# Patient Record
Sex: Male | Born: 1950 | Race: White | Hispanic: No | Marital: Married | Smoking: Current every day smoker
Health system: Southern US, Community
[De-identification: ages and names within clinical notes are randomized; demographics above are authoritative.]

## PROBLEM LIST (undated history)

## (undated) DIAGNOSIS — M1990 Unspecified osteoarthritis, unspecified site: Secondary | ICD-10-CM

## (undated) DIAGNOSIS — Z720 Tobacco use: Secondary | ICD-10-CM

## (undated) DIAGNOSIS — Z8679 Personal history of other diseases of the circulatory system: Secondary | ICD-10-CM

## (undated) DIAGNOSIS — I4819 Other persistent atrial fibrillation: Secondary | ICD-10-CM

## (undated) DIAGNOSIS — I351 Nonrheumatic aortic (valve) insufficiency: Secondary | ICD-10-CM

## (undated) DIAGNOSIS — I10 Essential (primary) hypertension: Secondary | ICD-10-CM

## (undated) DIAGNOSIS — Z9289 Personal history of other medical treatment: Secondary | ICD-10-CM

## (undated) DIAGNOSIS — B1920 Unspecified viral hepatitis C without hepatic coma: Secondary | ICD-10-CM

## (undated) DIAGNOSIS — F419 Anxiety disorder, unspecified: Secondary | ICD-10-CM

## (undated) DIAGNOSIS — I739 Peripheral vascular disease, unspecified: Secondary | ICD-10-CM

## (undated) DIAGNOSIS — A159 Respiratory tuberculosis unspecified: Secondary | ICD-10-CM

## (undated) DIAGNOSIS — M75101 Unspecified rotator cuff tear or rupture of right shoulder, not specified as traumatic: Secondary | ICD-10-CM

## (undated) HISTORY — DX: Personal history of other diseases of the circulatory system: Z86.79

## (undated) HISTORY — PX: OTHER SURGICAL HISTORY: SHX169

## (undated) HISTORY — DX: Nonrheumatic aortic (valve) insufficiency: I35.1

## (undated) HISTORY — DX: Peripheral vascular disease, unspecified: I73.9

## (undated) HISTORY — PX: TONSILLECTOMY: SHX5217

## (undated) HISTORY — DX: Essential (primary) hypertension: I10

## (undated) HISTORY — DX: Unspecified viral hepatitis C without hepatic coma: B19.20

## (undated) HISTORY — DX: Personal history of other medical treatment: Z92.89

## (undated) HISTORY — DX: Other persistent atrial fibrillation: I48.19

## (undated) HISTORY — DX: Tobacco use: Z72.0

---

## 1999-10-17 ENCOUNTER — Encounter (INDEPENDENT_AMBULATORY_CARE_PROVIDER_SITE_OTHER): Payer: Self-pay | Admitting: Specialist

## 1999-10-17 ENCOUNTER — Ambulatory Visit (HOSPITAL_COMMUNITY): Admission: RE | Admit: 1999-10-17 | Discharge: 1999-10-17 | Payer: Self-pay | Admitting: Gastroenterology

## 2004-03-16 ENCOUNTER — Ambulatory Visit (HOSPITAL_COMMUNITY): Admission: RE | Admit: 2004-03-16 | Discharge: 2004-03-16 | Payer: Self-pay | Admitting: Gastroenterology

## 2006-12-01 ENCOUNTER — Emergency Department (HOSPITAL_COMMUNITY): Admission: EM | Admit: 2006-12-01 | Discharge: 2006-12-01 | Payer: Self-pay | Admitting: Emergency Medicine

## 2007-07-27 ENCOUNTER — Emergency Department (HOSPITAL_COMMUNITY): Admission: EM | Admit: 2007-07-27 | Discharge: 2007-07-27 | Payer: Self-pay | Admitting: Internal Medicine

## 2009-02-19 ENCOUNTER — Emergency Department (HOSPITAL_COMMUNITY)
Admission: EM | Admit: 2009-02-19 | Discharge: 2009-02-19 | Payer: Self-pay | Source: Home / Self Care | Admitting: Emergency Medicine

## 2010-01-19 ENCOUNTER — Ambulatory Visit (HOSPITAL_COMMUNITY)
Admission: RE | Admit: 2010-01-19 | Discharge: 2010-01-19 | Payer: Self-pay | Source: Home / Self Care | Attending: Family Medicine | Admitting: Family Medicine

## 2010-03-01 ENCOUNTER — Ambulatory Visit (INDEPENDENT_AMBULATORY_CARE_PROVIDER_SITE_OTHER): Payer: BC Managed Care – PPO | Admitting: Cardiovascular Disease

## 2010-03-01 DIAGNOSIS — R5381 Other malaise: Secondary | ICD-10-CM

## 2010-03-01 DIAGNOSIS — R5383 Other fatigue: Secondary | ICD-10-CM

## 2010-03-01 DIAGNOSIS — I119 Hypertensive heart disease without heart failure: Secondary | ICD-10-CM

## 2010-04-06 LAB — DIFFERENTIAL
Basophils Absolute: 0 10*3/uL (ref 0.0–0.1)
Basophils Relative: 1 % (ref 0–1)
Eosinophils Relative: 3 % (ref 0–5)
Lymphocytes Relative: 31 % (ref 12–46)
Monocytes Absolute: 0.8 10*3/uL (ref 0.1–1.0)
Monocytes Relative: 13 % — ABNORMAL HIGH (ref 3–12)
Neutro Abs: 3.2 10*3/uL (ref 1.7–7.7)
Neutrophils Relative %: 52 % (ref 43–77)

## 2010-04-06 LAB — BASIC METABOLIC PANEL
CO2: 28 mEq/L (ref 19–32)
Chloride: 102 mEq/L (ref 96–112)
Creatinine, Ser: 0.97 mg/dL (ref 0.4–1.5)
GFR calc Af Amer: >60 mL/min (ref >60)
Glucose, Bld: 105 mg/dL — ABNORMAL HIGH (ref 70–99)
Potassium: 3.6 mEq/L (ref 3.5–5.1)

## 2010-04-06 LAB — CBC
HCT: 43.2 % (ref 39.0–52.0)
MCHC: 34.6 g/dL (ref 30.0–36.0)
WBC: 6.1 10*3/uL (ref 4.0–10.5)

## 2010-04-06 LAB — POCT CARDIAC MARKERS: Myoglobin, poc: 71.7 ng/mL (ref 12–200)

## 2010-06-02 NOTE — Procedures (Signed)
[facility]  Patient:    Kevin Leon, Kevin Leon                        MRN: [id number] Proc. Date: 10/17/99 Adm. Date:  [id number] Attending:  Deneen Harts CC:         Teena Irani. Arlyce Dice, M.D.   Procedure Report  PROCEDURE PERFORMED:  Percutaneous liver biopsy.  ENDOSCOPIST:  Griffith Citron, M.D.  INDICATIONS:  A 60 year old white male undergoing percutaneous liver biopsy to assess degree of hepatic inflammation and fibrosis due to chronic viral hepatitis C.  DESCRIPTION OF PROCEDURE:  After reviewing the nature of the procedure with the patient including the potential risks of hemorrhage and perforation, informed consent was signed.  The patient was examined and a site selected in the 9th intercostal space right mid axillary line with dullness to percussion and full exploration.  No bruit or rub heard on auscultation.  The patient was premedicated receiving Versed 3 mg and fentanyl 37.5 mcg IV. He was maintained on low flow oxygen and monitor throughout the course of the procedure.  The biopsy site was prepped with Betadine followed by local 1% Xylocaine infiltration.  Using Klatskin liver biopsy technique, a single pass was made after a trocar puncture.  This yielded two grossly normal appearing cores of hepatic tissue each approximately 2 cm in length.  These were submitted for routine A/E.  The wound was dressed with a pressure dressing and the patient rotated into a right lateral decubitus position.  He tolerated the procedure without immediate complication.  He returned to recovery in stable condition.  ASSESSMENT: 1. Percutaneous liver biopsy successfully completed without    immediate complication. 2. Chronic hepatitis C.  RECOMMENDATION: 1. Post procedure order is written. 2. Patient instructions reviewed. 3. Return office visit in two weeks to follow up pathology and    discuss roll of antiviral treatment. DD:  10/17/99 TD:   10/17/99 Job: [id number]

## 2010-06-02 NOTE — Op Note (Signed)
NAME:  Kevin Leon, Kevin Leon               ACCOUNT NO.:  [id number]   MEDICAL RECORD NO.:  [id number]          PATIENT TYPE:  AMB   LOCATION:  ENDO                         FACILITY:  [facility]   PHYSICIAN:  James L. Malon Kindle., M.D.DATE OF BIRTH:  1950-05-12   DATE OF PROCEDURE:  03/16/2004  DATE OF DISCHARGE:                                 OPERATIVE REPORT   PROCEDURE:  Colonoscopy.   MEDICATIONS:  Fentanyl 100 mcg, Versed 10 mg IV.   ENDOSCOPE:  Olympus pediatric adjustable colonoscope.   INDICATIONS:  Strong family history of colon cancer.   DESCRIPTION OF PROCEDURE:  The procedure explained to the patient and  consent obtained.  With the patient in the left lateral decubitus position,  the Olympus scope was inserted and advanced.  The prep was quite good.  By  using abdominal pressure and position changes, we were eventually able to  reach the cecum with the patient in the right lateral decubitus position.  The ileocecal valve and appendiceal orifice were seen.  The scope withdrawn  and the cecum, ascending colon, transverse colon, splenic flexure,  descending and sigmoid colon seen well.  No polyps seen, no diverticulosis.  The rectum was free of polyps.  The scope was withdrawn.  The patient  tolerated the procedure well.   ASSESSMENT:  Strong family history of colon cancer with negative colonoscopy  at this time, V16.0.   PLAN:  Will recommend yearly Hemoccults, repeat colonoscopy in five years.      JLE/MEDQ  D:  03/16/2004  T:  03/16/2004  Job:  [id number]   cc:   Teena Irani. Arlyce Dice, M.D.  [address]  [place]  [place] [address]  Fax: [phone]

## 2010-06-26 ENCOUNTER — Other Ambulatory Visit: Payer: Self-pay | Admitting: Cardiovascular Disease

## 2010-06-26 NOTE — Telephone Encounter (Signed)
Fax received from pharmacy. Refill completed. Jodette Semisi Biela RN  

## 2010-10-12 LAB — POCT CARDIAC MARKERS
CKMB, poc: 1.2
CKMB, poc: <1 — ABNORMAL LOW
Myoglobin, poc: 48.4
Myoglobin, poc: 61.1
Operator id: 294521
Troponin i, poc: <0.05

## 2010-10-12 LAB — POCT I-STAT, CHEM 8
Chloride: 104
Glucose, Bld: 107 — ABNORMAL HIGH
HCT: 47
Hemoglobin: 16
Potassium: 4.1
Sodium: 141
TCO2: 29

## 2010-10-12 LAB — CBC
HCT: 44
Hemoglobin: 15.1
RBC: 4.57
RDW: 12.9

## 2010-12-02 ENCOUNTER — Other Ambulatory Visit: Payer: Self-pay | Admitting: Cardiovascular Disease

## 2011-05-15 ENCOUNTER — Ambulatory Visit (INDEPENDENT_AMBULATORY_CARE_PROVIDER_SITE_OTHER): Payer: BC Managed Care – PPO | Admitting: Cardiovascular Disease

## 2011-05-15 ENCOUNTER — Encounter: Payer: Self-pay | Admitting: Cardiovascular Disease

## 2011-05-15 VITALS — BP 160/80 | HR 56 | Ht 72.0 in | Wt 177.8 lb

## 2011-05-15 DIAGNOSIS — I10 Essential (primary) hypertension: Secondary | ICD-10-CM

## 2011-05-15 NOTE — Assessment & Plan Note (Signed)
Kevin Leon continues to have an elevated blood pressure. He's now drinking between 6 and 8 beers at night. I think this is a large part of his hypertension.  I've encouraged him to cut back significantly on his alcohol intake. We have counseled him regarding a low-salt diet. I've asked him to exercise on a regular basis.  He'll continue to check his blood pressure and I'll see him again in several months for an office visit. He'll write down his blood pressure readings and will bring them with him at his next visit.

## 2011-05-15 NOTE — Patient Instructions (Signed)
Your physician recommends that you schedule a follow-up appointment in: 3 months   Watch bp readings, keep a log to bring to your next appointment

## 2011-05-15 NOTE — Progress Notes (Signed)
    [name] Date of Birth  1950-07-24 [facility]     [place] Office  [address]    [address]   [address] [place], [place]  [address]    [place], [place]  [address] [phone]  Fax  [phone]  [phone]  Fax [phone]  Problem list: 1. Hypertension 2. Hepatitis C 3. Chest pain  History of Present Illness:  Brett Canales is a 61 y.o. gentleman with the above noted history.  He has done well since I last saw him a year ago.  He stopped smoking 2 months ago.  It has been the hardest thing he has ever done.  He is breathing much better.  His morning cough resolved after several days.  He has occasional episodes of sharp cp.    We performed a stress echocardiogram in 2010. He walked for 5 minutes and 14 seconds of an advanced Bruce protocol. He achieved a peak heart rate 151. He had no ST or T wave adenopathies. The echo images were normal. He had no evidence of ischemia.  He has had a CT of his chest and his lungs were found to be normal.  Current Outpatient Prescriptions on File Prior to Visit  Medication Sig Dispense Refill  . carvedilol (COREG) 6.25 MG tablet TAKE 1 TABLET BY MOUTH TWICE A DAY  180 tablet  2    No Known Allergies  Past Medical History  Diagnosis Date  . HTN (hypertension)   . Hepatitis C   . Chest pain     Past Surgical History  Procedure Date  . Tonsillectomy     History  Smoking status  . Former Smoker  . Quit date: 03/11/2011  Smokeless tobacco  . Not on file    History  Alcohol Use  . Yes    Daily    Family History  Problem Relation Age of Onset  . Hypertension Mother     Reviw of Systems:  Reviewed in the HPI.  All other systems are negative.  Physical Exam: Blood pressure 164/84, pulse 56, height 6' (1.829 m), weight 177 lb 12.8 oz (80.65 kg). General: Well developed, well nourished, in no acute distress.  Head: Normocephalic, atraumatic, sclera non-icteric, mucus membranes are moist,   Neck:  Supple. Carotids are 2 + without bruits. No JVD  Lungs: Clear bilaterally to auscultation.  Heart: regular rate.  normal  S1 S2. No murmurs, gallops or rubs.  Abdomen: Soft, non-tender, non-distended with normal bowel sounds. No hepatomegaly. No rebound/guarding. No masses.  Msk:  Strength and tone are normal  Extremities: No clubbing or cyanosis. No edema.  Distal pedal pulses are 2+ and equal bilaterally.  Neuro: Alert and oriented X 3. Moves all extremities spontaneously.  Psych:  Responds to questions appropriately with a normal affect.  ECG: 05/15/2011- sinus bradycardia at 56 beats a minute. Otherwise the EKG is normal.  Assessment / Plan:

## 2011-08-16 ENCOUNTER — Ambulatory Visit (INDEPENDENT_AMBULATORY_CARE_PROVIDER_SITE_OTHER): Payer: BC Managed Care – PPO | Admitting: Cardiovascular Disease

## 2011-08-16 ENCOUNTER — Encounter: Payer: Self-pay | Admitting: Cardiovascular Disease

## 2011-08-16 VITALS — BP 142/68 | HR 71 | Ht 72.0 in | Wt 176.8 lb

## 2011-08-16 DIAGNOSIS — I10 Essential (primary) hypertension: Secondary | ICD-10-CM

## 2011-08-16 NOTE — Assessment & Plan Note (Signed)
Brett Canales is doing well.  I agree with smoking cessation and greatly limiting his beer drinking.  I'll see him in 6 months for OV and fasting labs.

## 2011-08-16 NOTE — Progress Notes (Signed)
    Kevin Leon Date of Birth  04/20/1950 [facility]     [place] Office  [address]    [address]   [address] [place], [place]  [address]    [place], [place]  [address] [phone]  Fax  [phone]  [phone]  Fax [phone]  Problem list: 1. Hypertension 2. Hepatitis C 3. Chest pain  History of Present Illness:  Kevin Leon is a 61 y.o. gentleman with the above noted history.  He has done well since I last saw him a year ago.  He stopped smoking for 3 months but then restarted again after 3 months.   We performed a stress echocardiogram in 2010. He walked for 5 minutes and 14 seconds of an advanced Bruce protocol. He achieved a peak heart rate 151. He had no ST or T wave adenopathies. The echo images were normal. He had no evidence of ischemia.  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  He still drinks lots of beer.  This may account for his BP variability.   Current Outpatient Prescriptions on File Prior to Visit  Medication Sig Dispense Refill  . carvedilol (COREG) 6.25 MG tablet TAKE 1 TABLET BY MOUTH TWICE A DAY  180 tablet  2  . LORazepam (ATIVAN) 0.5 MG tablet Take 0.5 mg by mouth every 6 (six) hours as needed.         No Known Allergies  Past Medical History  Diagnosis Date  . HTN (hypertension)   . Hepatitis C   . Chest pain     Past Surgical History  Procedure Date  . Tonsillectomy     History  Smoking status  . Former Smoker  . Quit date: 03/11/2011  Smokeless tobacco  . Not on file    History  Alcohol Use  . Yes    Daily    Family History  Problem Relation Age of Onset  . Hypertension Mother     Reviw of Systems:  Reviewed in the HPI.  All other systems are negative.  Physical Exam: Blood pressure 142/68, pulse 71, height 6' (1.829 m), weight 176 lb 12.8 oz (80.196 kg). General: Well developed, well nourished, in no acute distress.  Head: Normocephalic, atraumatic, sclera  non-icteric, mucus membranes are moist,   Neck: Supple. Carotids are 2 + without bruits. No JVD  Lungs: Clear bilaterally to auscultation.  Heart: regular rate.  normal  S1 S2. No murmurs, gallops or rubs.  Abdomen: Soft, non-tender, non-distended with normal bowel sounds. No hepatomegaly. No rebound/guarding. No masses.  Msk:  Strength and tone are normal  Extremities: No clubbing or cyanosis. No edema.  Distal pedal pulses are 2+ and equal bilaterally.  Neuro: Alert and oriented X 3. Moves all extremities spontaneously.  Psych:  Responds to questions appropriately with a normal affect.  ECG:  Assessment / Plan:

## 2011-08-16 NOTE — Patient Instructions (Addendum)
Your physician wants you to follow-up in: 6 months  You will receive a reminder letter in the mail two months in advance. If you don't receive a letter, please call our office to schedule the follow-up appointment.  Your physician recommends that you return for a FASTING lipid profile: 6 months   

## 2011-12-18 ENCOUNTER — Telehealth: Payer: Self-pay | Admitting: Cardiovascular Disease

## 2011-12-18 MED ORDER — CARVEDILOL 6.25 MG PO TABS: 6.2500 mg | ORAL_TABLET | Freq: Two times a day (BID) | ORAL | Status: DC

## 2011-12-18 NOTE — Telephone Encounter (Signed)
Fax Received. Refill Completed. Tristian Sickinger Chowoe (R.M.A)   

## 2011-12-18 NOTE — Telephone Encounter (Signed)
New Problem:    Patient called in needing a refill of his carvedilol (COREG) 6.25 MG tablet.  Patient is out of his medication.

## 2011-12-19 ENCOUNTER — Other Ambulatory Visit: Payer: Self-pay | Admitting: *Deleted

## 2011-12-19 NOTE — Telephone Encounter (Signed)
Opened in Error.

## 2012-05-23 ENCOUNTER — Ambulatory Visit: Payer: Self-pay | Admitting: Physician Assistant

## 2012-08-01 ENCOUNTER — Ambulatory Visit: Payer: BC Managed Care – PPO | Admitting: Family Medicine

## 2012-09-23 ENCOUNTER — Other Ambulatory Visit: Payer: Self-pay | Admitting: *Deleted

## 2012-09-23 MED ORDER — CARVEDILOL 6.25 MG PO TABS: 6.2500 mg | ORAL_TABLET | Freq: Two times a day (BID) | ORAL | Status: DC

## 2013-02-26 ENCOUNTER — Telehealth: Payer: Self-pay | Admitting: Cardiovascular Disease

## 2013-02-26 NOTE — Telephone Encounter (Deleted)
New message ° °Kevin Leon with Dr. Pottors office called states the pt has Tremors. Is currently taking Propranolol 80 mg-- 2 times a day. The pt is currently on Metoprolol. Please call back to discuss changing the medication ° °[phone] °Kevin Leon with Dr. Pottors  ° °

## 2013-03-02 NOTE — Telephone Encounter (Signed)
Spoke with sender/ this was sent in error.

## 2013-03-13 ENCOUNTER — Encounter: Payer: Self-pay | Admitting: Cardiovascular Disease

## 2013-03-13 ENCOUNTER — Ambulatory Visit (INDEPENDENT_AMBULATORY_CARE_PROVIDER_SITE_OTHER): Payer: BC Managed Care – PPO | Admitting: Cardiovascular Disease

## 2013-03-13 ENCOUNTER — Encounter (INDEPENDENT_AMBULATORY_CARE_PROVIDER_SITE_OTHER): Payer: Self-pay

## 2013-03-13 VITALS — BP 150/72 | HR 65 | Ht 72.0 in | Wt 167.0 lb

## 2013-03-13 DIAGNOSIS — Z09 Encounter for follow-up examination after completed treatment for conditions other than malignant neoplasm: Secondary | ICD-10-CM

## 2013-03-13 DIAGNOSIS — I10 Essential (primary) hypertension: Secondary | ICD-10-CM

## 2013-03-13 MED ORDER — CARVEDILOL 6.25 MG PO TABS: 6.2500 mg | ORAL_TABLET | Freq: Two times a day (BID) | ORAL | Status: DC

## 2013-03-13 MED ORDER — LISINOPRIL 10 MG PO TABS: 10.0000 mg | ORAL_TABLET | Freq: Every day | ORAL | Status: DC

## 2013-03-13 NOTE — Patient Instructions (Signed)
Your physician has recommended you make the following change in your medication:  START LISINOPRIL 10 MG DAILY  Your physician recommends that you return for a FASTING lipid profile: 3 WEEKS   Your physician recommends that you schedule a follow-up appointment in: 3 MONTHS

## 2013-03-13 NOTE — Assessment & Plan Note (Signed)
His BP is mildly elevated.  Will start him on Lisinopril 10 mg a day.  Will have him check BMP in 3 weeks.  He is also overdue for his lipids. Lipid profile and liver enzymes at that same time.  I will see him in 3 months.

## 2013-03-13 NOTE — Progress Notes (Signed)
    Belva Bertin Date of Birth  06/21/1950 [place] [address]    [place]   [place], [place]  [address]    [place], [place]  [address] [phone]  Fax  [phone]  [phone]  Fax [phone]  Problem list: 1. Hypertension 2. Hepatitis C 3. Chest pain  History of Present Illness:  Kevin Leon is a 63 y.o. gentleman with the above noted history.  He has done well since I last saw him a year ago.  He stopped smoking for 3 months but then restarted again after 3 months.   We performed a stress echocardiogram in 2010. He walked for 5 minutes and 14 seconds of an advanced Bruce protocol. He achieved a peak heart rate 151. He had no ST or T wave abnormalities The echo images were normal. He had no evidence of ischemia.  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  He still drinks lots of beer.  This may account for his BP variability.  Mar 13, 2013:  He has done well.  Slightly anxious about his BP today. He has continued to smoke.   He stopped for 3 months but restarted back after his mother died.  He has cut back on her beer intake.      Current Outpatient Prescriptions on File Prior to Visit  Medication Sig Dispense Refill  . carvedilol (COREG) 6.25 MG tablet Take 1 tablet (6.25 mg total) by mouth 2 (two) times daily with a meal.  180 tablet  1  . LORazepam (ATIVAN) 0.5 MG tablet Take 0.5 mg by mouth every 6 (six) hours as needed.        No current facility-administered medications on file prior to visit.    No Known Allergies  Past Medical History  Diagnosis Date  . HTN (hypertension)   . Hepatitis C   . Chest pain     Past Surgical History  Procedure Laterality Date  . Tonsillectomy      History  Smoking status  . Former Smoker  . Quit date: 03/11/2011  Smokeless tobacco  . Not on file    History  Alcohol Use  . Yes    Comment: Daily    Family History  Problem  Relation Age of Onset  . Hypertension Mother     Reviw of Systems:  Reviewed in the HPI.  All other systems are negative.  Physical Exam: Blood pressure 170/70, pulse 65, height 6' (1.829 m), weight 167 lb (75.751 kg). General: Well developed, well nourished, in no acute distress.  Head: Normocephalic, atraumatic, sclera non-icteric, mucus membranes are moist,  Neck: Supple. Carotids are 2 + without bruits. No JVD Lungs: Clear bilaterally to auscultation. Heart: regular rate.  normal  S1 S2. No murmurs, gallops or rubs. Abdomen: Soft, non-tender, non-distended with normal bowel sounds. No hepatomegaly. No rebound/guarding. No masses. Msk:  Strength and tone are normal Extremities: No clubbing or cyanosis. No edema.  Distal pedal pulses are 2+ and equal bilaterally. Neuro: Alert and oriented X 3. Moves all extremities spontaneously. Psych:  Responds to questions appropriately with a normal affect.  ECG: 03/13/2013: Normal sinus rhythm at 64. He has no ST or T wave changes. Assessment / Plan:

## 2013-03-31 DIAGNOSIS — B356 Tinea cruris: Secondary | ICD-10-CM | POA: Insufficient documentation

## 2013-03-31 DIAGNOSIS — F411 Generalized anxiety disorder: Secondary | ICD-10-CM | POA: Insufficient documentation

## 2013-03-31 DIAGNOSIS — B1920 Unspecified viral hepatitis C without hepatic coma: Secondary | ICD-10-CM | POA: Insufficient documentation

## 2013-04-03 ENCOUNTER — Other Ambulatory Visit (INDEPENDENT_AMBULATORY_CARE_PROVIDER_SITE_OTHER): Payer: BC Managed Care – PPO

## 2013-04-03 DIAGNOSIS — I10 Essential (primary) hypertension: Secondary | ICD-10-CM

## 2013-04-03 DIAGNOSIS — Z09 Encounter for follow-up examination after completed treatment for conditions other than malignant neoplasm: Secondary | ICD-10-CM

## 2013-04-03 LAB — HEPATIC FUNCTION PANEL
ALT: 21 U/L (ref 0–53)
AST: 23 U/L (ref 0–37)
Albumin: 4.3 g/dL (ref 3.5–5.2)
Alkaline Phosphatase: 61 U/L (ref 39–117)
Bilirubin, Direct: 0.1 mg/dL (ref 0.0–0.3)
Total Bilirubin: 0.9 mg/dL (ref 0.3–1.2)
Total Protein: 7 g/dL (ref 6.0–8.3)

## 2013-04-03 LAB — BASIC METABOLIC PANEL
BUN: 8 mg/dL (ref 6–23)
CALCIUM: 9.2 mg/dL (ref 8.4–10.5)
CHLORIDE: 97 meq/L (ref 96–112)
CO2: 31 meq/L (ref 19–32)
CREATININE: 0.8 mg/dL (ref 0.4–1.5)
GFR: 100.74 mL/min (ref >60.00)
GLUCOSE: 96 mg/dL (ref 70–99)
Potassium: 4.5 mEq/L (ref 3.5–5.1)
SODIUM: 133 meq/L — AB (ref 135–145)

## 2013-04-03 LAB — LIPID PANEL
CHOL/HDL RATIO: 2
CHOLESTEROL: 151 mg/dL (ref 0–200)
HDL: 84.5 mg/dL (ref >39.00)
LDL Cholesterol: 63 mg/dL (ref 0–99)
TRIGLYCERIDES: 19 mg/dL (ref 0.0–149.0)
VLDL: 3.8 mg/dL (ref 0.0–40.0)

## 2013-06-11 ENCOUNTER — Ambulatory Visit (INDEPENDENT_AMBULATORY_CARE_PROVIDER_SITE_OTHER): Payer: BC Managed Care – PPO | Admitting: Cardiovascular Disease

## 2013-06-11 ENCOUNTER — Encounter: Payer: Self-pay | Admitting: Cardiovascular Disease

## 2013-06-11 VITALS — BP 133/69 | HR 72 | Ht 72.0 in | Wt 164.0 lb

## 2013-06-11 DIAGNOSIS — I10 Essential (primary) hypertension: Secondary | ICD-10-CM

## 2013-06-11 NOTE — Assessment & Plan Note (Signed)
Kevin Leon is doing well.    BP is well controlled.   He has a slight cough but it's non consistent enough to be an Ace inhibitor cough.    Overall he is doing very well.  I will see him in 1 year.

## 2013-06-11 NOTE — Progress Notes (Signed)
Kevin Leon Date of Birth  1950-11-24 [place] [address]    [place]   [place], [place]  [address]    [place], [place]  [address] [phone]  Fax  [phone]  [phone]  Fax [phone]  Problem list: 1. Hypertension 2. Hepatitis C 3. Chest pain  History of Present Illness:  Kevin Leon is a 63 y.o. gentleman with the above noted history.  He has done well since I last saw him a year ago.  He stopped smoking for 3 months but then restarted again after 3 months.   We performed a stress echocardiogram in 2010. He walked for 5 minutes and 14 seconds of an advanced Bruce protocol. He achieved a peak heart rate 151. He had no ST or T wave abnormalities The echo images were normal. He had no evidence of ischemia.  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  He still drinks lots of beer.  This may account for his BP variability.  Mar 13, 2013:  He has done well.  Slightly anxious about his BP today. He has continued to smoke.   He stopped for 3 months but restarted back after his mother died.  He has cut back on her beer intake.    Jun 11, 2013:  Kevin Leon is doing well.  BP has been much better.  Lisinopril has been well tolerated.   He has had a slight cough but it's not continuous.  Doubt its due to ACE-I.    Current Outpatient Prescriptions on File Prior to Visit  Medication Sig Dispense Refill  . carvedilol (COREG) 6.25 MG tablet Take 1 tablet (6.25 mg total) by mouth 2 (two) times daily with a meal.  180 tablet  1  . lisinopril (PRINIVIL,ZESTRIL) 10 MG tablet Take 1 tablet (10 mg total) by mouth daily.  90 tablet  1  . LORazepam (ATIVAN) 0.5 MG tablet Take 0.5 mg by mouth every 6 (six) hours as needed.        No current facility-administered medications on file prior to visit.    No Known Allergies  Past Medical History  Diagnosis Date  . HTN (hypertension)   . Hepatitis C    . Chest pain     Past Surgical History  Procedure Laterality Date  . Tonsillectomy      History  Smoking status  . Former Smoker  . Quit date: 03/11/2011  Smokeless tobacco  . Not on file    History  Alcohol Use  . Yes    Comment: Daily    Family History  Problem Relation Age of Onset  . Hypertension Mother     Reviw of Systems:  Reviewed in the HPI.  All other systems are negative.  Physical Exam: Blood pressure 133/69, pulse 72, height 6' (1.829 m), weight 164 lb (74.39 kg). General: Well developed, well nourished, in no acute distress.  Head: Normocephalic, atraumatic, sclera non-icteric, mucus membranes are moist,  Neck: Supple. Carotids are 2 + without bruits. No JVD Lungs: Clear bilaterally to auscultation. Heart: regular rate.  normal  S1 S2. No murmurs, gallops or rubs. Abdomen: Soft, non-tender, non-distended with normal bowel sounds. No hepatomegaly. No rebound/guarding. No masses. Msk:  Strength and tone are normal Extremities: No clubbing or cyanosis. No edema.  Distal pedal pulses are 2+ and equal bilaterally. Neuro: Alert and oriented X 3. Moves all extremities  spontaneously. Psych:  Responds to questions appropriately with a normal affect.  ECG: 03/13/2013: Normal sinus rhythm at 64. He has no ST or T wave changes. Assessment / Plan:

## 2013-06-11 NOTE — Patient Instructions (Signed)
Your physician recommends that you continue on your current medications as directed. Please refer to the Current Medication list given to you today.  Your physician wants you to follow-up in: 1 year with Dr. Nahser.  You will receive a reminder letter in the mail two months in advance. If you don't receive a letter, please call our office to schedule the follow-up appointment.  

## 2013-07-10 ENCOUNTER — Ambulatory Visit (INDEPENDENT_AMBULATORY_CARE_PROVIDER_SITE_OTHER): Payer: BC Managed Care – PPO | Admitting: Family Medicine

## 2013-07-10 ENCOUNTER — Encounter: Payer: Self-pay | Admitting: Family Medicine

## 2013-07-10 VITALS — BP 108/64 | HR 62 | Temp 97.4°F | Resp 16 | Ht 72.0 in | Wt 165.0 lb

## 2013-07-10 DIAGNOSIS — H6120 Impacted cerumen, unspecified ear: Secondary | ICD-10-CM

## 2013-07-10 DIAGNOSIS — H6122 Impacted cerumen, left ear: Secondary | ICD-10-CM

## 2013-07-10 NOTE — Assessment & Plan Note (Signed)
Irrigated at bedside, return prn

## 2013-07-10 NOTE — Patient Instructions (Signed)
FOllow up as needed

## 2013-07-10 NOTE — Progress Notes (Signed)
Patient ID: Kevin Leon, male   DOB: 1950-07-12, 63 y.o.   MRN: [id number]   Subjective:    Patient ID: Kevin Leon, male    DOB: [birth year], 63 y.o.   MRN: [id number]  Patient presents for Increased ear wax  patient here with wax in his ears. He states that he gets this about once a year. He typically follows with some her full family practice but does come to [facility] as needed for acute visits. He has no other concerns today. He's noticed his hearing is decreased on the left side mostly which he thinks is mostly impacted. He's not had any cough sinus drainage no other concerns. His medications were reviewed     Review Of Systems:  GEN- denies fatigue, fever, weight loss,weakness, recent illness HEENT- denies eye drainage, change in vision, nasal discharge, CVS- denies chest pain, palpitations RESP- denies SOB, cough, wheeze Neuro- denies headache, dizziness, syncope, seizure activity       Objective:    BP 108/64  Pulse 62  Temp(Src) 97.4 F (36.3 C) (Oral)  Resp 16  Ht 6' (1.829 m)  Wt 165 lb (74.844 kg)  BMI 22.37 kg/m2 GEN- NAD, alert and oriented x3 HEENT- , MMM, oropharynx clear. Left TM canal impacted soft brown cerumen, right Canal mild narrowing, TM clear no wax impaction, no erythema, hearing grossly in tact Neck- Supple, no thyromegaly        Assessment & Plan:      Problem List Items Addressed This Visit   Cerumen impaction - Primary     Irrigated at bedside, return prn       Note: This dictation was prepared with Dragon dictation along with smaller Company secretary. Any transcriptional errors that result from this process are unintentional.

## 2013-08-24 ENCOUNTER — Telehealth: Payer: Self-pay | Admitting: Family Medicine

## 2013-08-24 NOTE — Telephone Encounter (Signed)
[id number]  PT is needing to know who did his colonoscopy

## 2013-08-24 NOTE — Telephone Encounter (Signed)
Look up in paper chart

## 2013-08-25 NOTE — Telephone Encounter (Signed)
Looked in pt paper chart in reference to whom done his colonoscopy and it was Dr. Laurence Spates at [facility], called pt and informed him who done last one.

## 2013-09-03 ENCOUNTER — Encounter: Payer: Self-pay | Admitting: Physician Assistant

## 2013-09-03 ENCOUNTER — Ambulatory Visit (INDEPENDENT_AMBULATORY_CARE_PROVIDER_SITE_OTHER): Payer: BC Managed Care – PPO | Admitting: Physician Assistant

## 2013-09-03 VITALS — BP 132/68 | HR 78 | Temp 97.7°F | Resp 14 | Ht 72.0 in | Wt 162.0 lb

## 2013-09-03 DIAGNOSIS — H00019 Hordeolum externum unspecified eye, unspecified eyelid: Secondary | ICD-10-CM

## 2013-09-03 DIAGNOSIS — H00016 Hordeolum externum left eye, unspecified eyelid: Secondary | ICD-10-CM

## 2013-09-03 MED ORDER — SULFACETAMIDE SODIUM 10 % OP SOLN: 1.0000 [drp] | OPHTHALMIC | Status: DC

## 2013-09-03 NOTE — Progress Notes (Signed)
    Patient ID: Kevin Leon MRN: [id number], DOB: Sep 18, 1950, 63 y.o. Date of Encounter: 09/03/2013, 11:18 AM    Chief Complaint:  Chief Complaint  Patient presents with  . Eye Infection    x3 days- L eye lower lid swollen and redness nted to sclera     HPI: 63 y.o. year old white male says that Dr. Deatra Ina was his doctor in the past and he's also friends with him.  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 Says that he's had this problem in the past and Dr. Deatra Ina gave him some antibiotic drops that cleared it right up. For the past few days has noticed this little bump at the edge of his left lower eyelid.  No other complaints today.     Home Meds:   Outpatient Prescriptions Prior to Visit  Medication Sig Dispense Refill  . carvedilol (COREG) 6.25 MG tablet Take 1 tablet (6.25 mg total) by mouth 2 (two) times daily with a meal.  180 tablet  1  . lisinopril (PRINIVIL,ZESTRIL) 10 MG tablet Take 1 tablet (10 mg total) by mouth daily.  90 tablet  1  . LORazepam (ATIVAN) 0.5 MG tablet Take 0.5 mg by mouth every 6 (six) hours as needed.        No facility-administered medications prior to visit.    Allergies: No Known Allergies    Review of Systems: See HPI for pertinent ROS. All other ROS negative.    Physical Exam: Blood pressure 132/68, pulse 78, temperature 97.7 F (36.5 C), temperature source Oral, resp. rate 14, height 6' (1.829 m), weight 162 lb (73.483 kg)., Body mass index is 21.97 kg/(m^2). General:  WNWD WM. Appears in no acute distress. HEENT: Left Eye: Lower Eyelid--lateral aspect--tiny papule, slightly pink.  No other papule, no other area of erythema.  Right Eye-Normal.  Neck: Supple. No thyromegaly. No lymphadenopathy. Lungs: Clear bilaterally to auscultation without wheezes, rales, or rhonchi. Breathing is unlabored. Heart: Regular rhythm. No  murmurs, rubs, or gallops. Msk:  Strength and tone normal for age. Extremities/Skin: Warm and dry.  Neuro: Alert and oriented X 3. Moves all extremities spontaneously. Gait is normal. CNII-XII grossly in tact. Psych:  Responds to questions appropriately with a normal affect.     ASSESSMENT AND PLAN:  63 y.o. year old male with  1. Hordeolum of left eye Recommended that every 2 hours he problem warm compress and white with cottonball with Johnson's and Johnson's baby shampoo on it. Then apply Bleph 10. Repeat this every 2 hours while awake. Continue this for one week. Followup if site worsens at all or does not resolve back to normal in one week. - sulfacetamide (BLEPH-10) 10 % ophthalmic solution; Place 1 drop into the left eye every 2 (two) hours while awake.  Dispense: 15 mL; Refill: 0   Signed, [address] [place], [place], [facility] 09/03/2013 11:18 AM

## 2013-09-09 ENCOUNTER — Other Ambulatory Visit: Payer: Self-pay | Admitting: Cardiovascular Disease

## 2013-09-22 ENCOUNTER — Ambulatory Visit (INDEPENDENT_AMBULATORY_CARE_PROVIDER_SITE_OTHER): Payer: BC Managed Care – PPO | Admitting: Family Medicine

## 2013-09-22 ENCOUNTER — Encounter: Payer: Self-pay | Admitting: Family Medicine

## 2013-09-22 VITALS — BP 132/68 | HR 64 | Temp 97.9°F | Resp 14 | Ht 72.0 in | Wt 162.0 lb

## 2013-09-22 DIAGNOSIS — H0019 Chalazion unspecified eye, unspecified eyelid: Secondary | ICD-10-CM

## 2013-09-22 DIAGNOSIS — H0015 Chalazion left lower eyelid: Secondary | ICD-10-CM

## 2013-09-22 NOTE — Addendum Note (Signed)
Addended by: Vic Blackbird F on: 09/22/2013 12:04 PM   Modules accepted: Level of Service

## 2013-09-22 NOTE — Patient Instructions (Signed)
Dr. Donato Heinz  3:30pm TODAY

## 2013-09-22 NOTE — Progress Notes (Signed)
Patient ID: Kevin Leon, male   DOB: 05-27-1950, 63 y.o.   MRN: [id number]   Subjective:    Patient ID: Kevin Leon, male    DOB: Dec 24, 1950, 63 y.o.   MRN: [id number]  Patient presents for Stye to L lower eye lid  patient here with persistent cystitis left lower eye. He's had this for approximately 3 weeks now. He was seen on August 20 at that time given Bleph-10 which he states he had something similar in the past and this cleared up. He has a mild tenderness on the lower lid no change in his vision he has not had any drainage from the lesion. She's not using the warm compresses    Review Of Systems:  GEN- denies fatigue, fever, weight loss,weakness, recent illness HEENT- denies eye drainage, change in vision, nasal discharge, Neuro- denies headache, dizziness, syncope, seizure activity       Objective:    BP 132/68  Pulse 64  Temp(Src) 97.9 F (36.6 C) (Oral)  Resp 14  Ht 6' (1.829 m)  Wt 162 lb (73.483 kg)  BMI 21.97 kg/m2 GEN- NAD, alert and oriented x3 HEENT- PERRL, EOMI, non injected sclera, mild injected left lower lid, hard chalazion of left lower lid, TTP, no drainage noted        Assessment & Plan:      Problem List Items Addressed This Visit   None    Visit Diagnoses   Chalazion of left lower eyelid    -  Primary    Referred to opthalmoloty this pm to have curretage done, d/c antibiotic drops       Note: This dictation was prepared with Dragon dictation along with smaller phrase technology. Any transcriptional errors that result from this process are unintentional.

## 2013-12-15 ENCOUNTER — Ambulatory Visit (INDEPENDENT_AMBULATORY_CARE_PROVIDER_SITE_OTHER): Payer: BC Managed Care – PPO | Admitting: Podiatry

## 2013-12-15 ENCOUNTER — Ambulatory Visit (INDEPENDENT_AMBULATORY_CARE_PROVIDER_SITE_OTHER): Payer: BC Managed Care – PPO

## 2013-12-15 ENCOUNTER — Encounter: Payer: Self-pay | Admitting: Podiatry

## 2013-12-15 VITALS — BP 144/69 | HR 66 | Resp 16

## 2013-12-15 DIAGNOSIS — M216X9 Other acquired deformities of unspecified foot: Secondary | ICD-10-CM

## 2013-12-15 DIAGNOSIS — Q667 Congenital pes cavus: Secondary | ICD-10-CM

## 2013-12-15 DIAGNOSIS — M79671 Pain in right foot: Secondary | ICD-10-CM

## 2013-12-15 DIAGNOSIS — L84 Corns and callosities: Secondary | ICD-10-CM

## 2013-12-15 DIAGNOSIS — M779 Enthesopathy, unspecified: Secondary | ICD-10-CM

## 2013-12-15 MED ORDER — TRIAMCINOLONE ACETONIDE 10 MG/ML IJ SUSP
10.0000 mg | Freq: Once | INTRAMUSCULAR | Status: AC
  Administered 2013-12-15: 10 mg

## 2013-12-15 NOTE — Progress Notes (Signed)
   Subjective:    Patient ID: Kevin Leon, male    DOB: 01/15/1951, 63 y.o.   MRN: [id number]  HPI Comments: "I have a bump on the bottom"  Patient c/o tender callused area plantar forefoot right for few months. Thought he had a splinter in his foot. The area is dark. She went to PCP and she said that its a callus from the bone underneath. Goes barefoot a lot. He's trying to wear shoes more to cushion the area. Wanted it checked. Still tender.     Review of Systems  All other systems reviewed and are negative.      Objective:   Physical Exam        Assessment & Plan:

## 2013-12-15 NOTE — Progress Notes (Signed)
Subjective:     Patient ID: Kevin Leon, male   DOB: 10-May-1950, 63 y.o.   MRN: [id number]  HPI patient states I developed a lot of pain in the bottom of my right foot over the last couple months. Patient states that it feels like it's on the bone and there is been fluid buildup and it's been inflamed and making walking difficult   Review of Systems  All other systems reviewed and are negative.      Objective:   Physical Exam  Constitutional: He is oriented to person, place, and time.  Cardiovascular: Intact distal pulses.   Musculoskeletal: Normal range of motion.  Neurological: He is oriented to person, place, and time.  Skin: Skin is warm.  Nursing note and vitals reviewed.  neurovascular status intact with muscle strength adequate and range of motion of the subtalar and midtarsal joint within normal limits. Patient is noted to have inflammation around the third metatarsal head right with fluid buildup and is noted to have a corn callus formation that's very painful when pressed. Digits are well-perfused and the patient is well oriented 3     Assessment:     Inflammatory callus sub-third metatarsal with inflammatory capsulitis noted third metatarsal phalangeal joint right    Plan:     H&P and x-rays reviewed. Did a careful injection of the area 3 mg Kenalog 5 mg Xylocaine to reduce the inflammatory plantar capsulitis and then did deep debridement of lesion with no iatrogenic bleeding noted

## 2014-03-01 ENCOUNTER — Encounter: Payer: Self-pay | Admitting: Podiatry

## 2014-03-01 ENCOUNTER — Ambulatory Visit (INDEPENDENT_AMBULATORY_CARE_PROVIDER_SITE_OTHER): Payer: BLUE CROSS/BLUE SHIELD | Admitting: Podiatry

## 2014-03-01 ENCOUNTER — Ambulatory Visit (INDEPENDENT_AMBULATORY_CARE_PROVIDER_SITE_OTHER): Payer: BLUE CROSS/BLUE SHIELD

## 2014-03-01 VITALS — BP 162/84 | HR 58 | Resp 15 | Ht 72.0 in | Wt 168.0 lb

## 2014-03-01 DIAGNOSIS — M79671 Pain in right foot: Secondary | ICD-10-CM

## 2014-03-01 DIAGNOSIS — M779 Enthesopathy, unspecified: Secondary | ICD-10-CM

## 2014-03-01 DIAGNOSIS — I739 Peripheral vascular disease, unspecified: Secondary | ICD-10-CM

## 2014-03-01 DIAGNOSIS — M205X1 Other deformities of toe(s) (acquired), right foot: Secondary | ICD-10-CM

## 2014-03-01 MED ORDER — TRIAMCINOLONE ACETONIDE 10 MG/ML IJ SUSP
10.0000 mg | Freq: Once | INTRAMUSCULAR | Status: AC
Start: 2014-03-01 — End: 2014-03-01
  Administered 2014-03-01: 10 mg

## 2014-03-01 NOTE — Progress Notes (Signed)
   Subjective:    Patient ID: Kevin Leon, male    DOB: 1950-11-23, 64 y.o.   MRN: [id number]  HPI Comments: Pt states the right 5th MPJ is painful and has been so on and off since 12/2013.    Pt complains of pain in the right plantar 1st toe again.  Foot Pain      Review of Systems  All other systems reviewed and are negative.      Objective:   Physical Exam        Assessment & Plan:

## 2014-03-02 NOTE — Progress Notes (Signed)
Subjective:     Patient ID: Kevin Leon, male   DOB: [birth year], 64 y.o.   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

## 2014-03-29 ENCOUNTER — Ambulatory Visit (INDEPENDENT_AMBULATORY_CARE_PROVIDER_SITE_OTHER): Payer: BLUE CROSS/BLUE SHIELD | Admitting: Podiatry

## 2014-03-29 ENCOUNTER — Encounter: Payer: Self-pay | Admitting: Podiatry

## 2014-03-29 VITALS — BP 153/76 | HR 60 | Resp 14

## 2014-03-29 DIAGNOSIS — I739 Peripheral vascular disease, unspecified: Secondary | ICD-10-CM | POA: Diagnosis not present

## 2014-03-29 DIAGNOSIS — M205X1 Other deformities of toe(s) (acquired), right foot: Secondary | ICD-10-CM

## 2014-03-29 DIAGNOSIS — M779 Enthesopathy, unspecified: Secondary | ICD-10-CM

## 2014-03-29 NOTE — Progress Notes (Signed)
   Subjective:    Patient ID: Kevin Leon, male    DOB: [birth year], 64 y.o.   MRN: [id number]  HPI F/U bunion on outside of right foot, recieved injection 1 month ago, says its doing much better.   Review of Systems  All other systems reviewed and are negative.      Objective:   Physical Exam        Assessment & Plan:

## 2014-03-31 NOTE — Progress Notes (Signed)
Subjective:     Patient ID: Kevin Leon, male   DOB: 11-11-1950, 64 y.o.   MRN: [id number]  HPI patient states that currently my right foot is feeling a lot better   Review of Systems     Objective:   Physical Exam Neurovascular status intact muscle strength adequate with diminished discomfort on the lateral side of the right fifth MPJ with fluid buildup still noted but quite a bit better with palpation    Assessment:     Inflammatory changes right fifth MPJ which has improved a lot and seems to at this point be stable    Plan:     Reviewed condition and advised on continued wider-type shoe gear soaks and if symptoms were to reoccur we will need to consider surgical intervention in this particular case

## 2014-04-02 DIAGNOSIS — Z87891 Personal history of nicotine dependence: Secondary | ICD-10-CM | POA: Insufficient documentation

## 2014-07-16 ENCOUNTER — Ambulatory Visit (INDEPENDENT_AMBULATORY_CARE_PROVIDER_SITE_OTHER): Payer: BLUE CROSS/BLUE SHIELD | Admitting: Cardiovascular Disease

## 2014-07-16 ENCOUNTER — Encounter: Payer: Self-pay | Admitting: Cardiovascular Disease

## 2014-07-16 VITALS — BP 140/80 | HR 68 | Ht 72.0 in | Wt 173.4 lb

## 2014-07-16 DIAGNOSIS — I10 Essential (primary) hypertension: Secondary | ICD-10-CM

## 2014-07-16 MED ORDER — LISINOPRIL 10 MG PO TABS: 10.0000 mg | ORAL_TABLET | Freq: Every day | ORAL | Status: DC

## 2014-07-16 MED ORDER — CARVEDILOL 6.25 MG PO TABS: 6.2500 mg | ORAL_TABLET | Freq: Two times a day (BID) | ORAL | Status: DC

## 2014-07-16 NOTE — Progress Notes (Signed)
Kevin Leon Date of Birth  [birth year] [address]    [place]   [place], [place]  [address]    [place], [place]  [address] [phone]  Fax  [phone]  [phone]  Fax [phone]  Problem list: 1. Hypertension 2. Hepatitis C 3. Chest pain  History of Present Illness:  Kevin Leon is a 64 y.o. gentleman with the above noted history.  He has done well since I last saw him a year ago.  He stopped smoking for 3 months but then restarted again after 3 months.   We performed a stress echocardiogram in 2010. He walked for 5 minutes and 14 seconds of an advanced Bruce protocol. He achieved a peak heart rate 151. He had no ST or T wave abnormalities The echo images were normal. He had no evidence of ischemia.  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  He still drinks lots of beer.  This may account for his BP variability.  Mar 13, 2013:  He has done well.  Slightly anxious about his BP today. He has continued to smoke.   He stopped for 3 months but restarted back after his mother died.  He has cut back on her beer intake.    Jun 11, 2013:  Kevin Leon is doing well.  BP has been much better.  Lisinopril has been well tolerated.   He has had a slight cough but it's not continuous.  Doubt its due to ACE-I.   July  1,2016: Quit smoking for 4 months, then restarted.  Is back on the patches now.   Felt much better when he was not smoking. BP was much better.    Current Outpatient Prescriptions on File Prior to Visit  Medication Sig Dispense Refill  . carvedilol (COREG) 6.25 MG tablet TAKE 1 TABLET BY MOUTH TWICE DAILY WITH A MEAL 180 tablet 3  . lisinopril (PRINIVIL,ZESTRIL) 10 MG tablet TAKE 1 TABLET (10 MG TOTAL) BY MOUTH DAILY. 90 tablet 3  . LORazepam (ATIVAN) 0.5 MG tablet Take 0.5 mg by mouth every 6 (six) hours as needed.      No current facility-administered medications on  file prior to visit.    No Known Allergies  Past Medical History  Diagnosis Date  . HTN (hypertension)   . Hepatitis C   . Chest pain     Past Surgical History  Procedure Laterality Date  . Tonsillectomy      History  Smoking status  . Former Smoker -- 0.50 packs/day  . Types: Cigarettes  . Quit date: 02/15/2014  Smokeless tobacco  . Never Used    History  Alcohol Use  . 0.0 oz/week  . 0 Standard drinks or equivalent per week    Comment: Daily    Family History  Problem Relation Age of Onset  . Hypertension Mother     Reviw of Systems:  Reviewed in the HPI.  All other systems are negative.  Physical Exam: Blood pressure 140/80, pulse 68, height 6' (1.829 m), weight 78.654 kg (173 lb 6.4 oz). General: Well developed, well nourished, in no acute distress.  Head: Normocephalic, atraumatic, sclera non-icteric, mucus membranes are moist,  Neck: Supple. Carotids are 2 + without bruits. No JVD Lungs: Clear bilaterally to auscultation. Heart: regular rate.  normal  S1 S2. No murmurs, gallops or rubs. Abdomen: Soft, non-tender, non-distended with normal  bowel sounds. No hepatomegaly. No rebound/guarding. No masses. Msk:  Strength and tone are normal Extremities: No clubbing or cyanosis. No edema.  Distal pedal pulses are 2+ and equal bilaterally. Neuro: Alert and oriented X 3. Moves all extremities spontaneously. Psych:  Responds to questions appropriately with a normal affect.  ECG: NSR at 68.  No ST or T wave changes   Assessment / Plan:   1. Hypertension - BP is well controlled.  Continue meds.  Refill .   2. Hepatitis C  3. Chest pain - no further episodes of cp Ill see him in 1 year   Nahser, Wonda Cheng, MD  07/16/2014 3:41 PM    [address] [place],  [place], [place]  [address] Pager [phone] Phone: [phone]; Fax: [phone]   [facility]  [address] [place], [place]   [address] [phone]    Fax [phone]

## 2014-07-16 NOTE — Patient Instructions (Signed)
Medication Instructions:  Your physician recommends that you continue on your current medications as directed. Please refer to the Current Medication list given to you today.   Labwork: None Ordered   Testing/Procedures: None Ordered   Follow-Up: Your physician wants you to follow-up in: 1 year with Dr. Nahser.  You will receive a reminder letter in the mail two months in advance. If you don't receive a letter, please call our office to schedule the follow-up appointment.      

## 2014-08-24 ENCOUNTER — Other Ambulatory Visit: Payer: Self-pay | Admitting: Orthopedic Surgery

## 2014-09-10 ENCOUNTER — Ambulatory Visit: Payer: BLUE CROSS/BLUE SHIELD | Admitting: Podiatry

## 2014-09-15 ENCOUNTER — Ambulatory Visit (INDEPENDENT_AMBULATORY_CARE_PROVIDER_SITE_OTHER): Payer: BLUE CROSS/BLUE SHIELD | Admitting: Podiatry

## 2014-09-15 ENCOUNTER — Encounter: Payer: Self-pay | Admitting: Podiatry

## 2014-09-15 VITALS — BP 135/71 | HR 65 | Resp 16

## 2014-09-15 DIAGNOSIS — L84 Corns and callosities: Secondary | ICD-10-CM

## 2014-09-15 DIAGNOSIS — M205X1 Other deformities of toe(s) (acquired), right foot: Secondary | ICD-10-CM

## 2014-09-15 NOTE — Progress Notes (Signed)
Subjective:     Patient ID: Kevin Leon, male   DOB: Mar 01, 1950, 64 y.o.   MRN: [id number]  HPI patient presents with painful calluses plantar right foot and also has had a history of bunion deformity right which he states currently is feeling pretty good. 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

## 2014-10-04 ENCOUNTER — Encounter (HOSPITAL_BASED_OUTPATIENT_CLINIC_OR_DEPARTMENT_OTHER): Payer: Self-pay | Admitting: *Deleted

## 2014-10-04 NOTE — Progress Notes (Signed)
Bring all medications. Requested labs from [facility].

## 2014-10-07 NOTE — Progress Notes (Signed)
Spoke with pt given new surgery time, to arrive at 6 am for 730 case.  Voiced understanding

## 2014-10-08 ENCOUNTER — Ambulatory Visit (HOSPITAL_BASED_OUTPATIENT_CLINIC_OR_DEPARTMENT_OTHER): Payer: BLUE CROSS/BLUE SHIELD | Admitting: Anesthesiology

## 2014-10-08 ENCOUNTER — Ambulatory Visit (HOSPITAL_BASED_OUTPATIENT_CLINIC_OR_DEPARTMENT_OTHER)
Admission: RE | Admit: 2014-10-08 | Discharge: 2014-10-08 | Disposition: A | Discharge status: Home / Self Care | Payer: BLUE CROSS/BLUE SHIELD | Source: Ambulatory Visit | Attending: Orthopedic Surgery | Admitting: Orthopedic Surgery

## 2014-10-08 ENCOUNTER — Encounter (HOSPITAL_BASED_OUTPATIENT_CLINIC_OR_DEPARTMENT_OTHER)
Admission: RE | Disposition: A | Discharge status: Home / Self Care | Payer: Self-pay | Source: Ambulatory Visit | Attending: Orthopedic Surgery

## 2014-10-08 ENCOUNTER — Encounter (HOSPITAL_BASED_OUTPATIENT_CLINIC_OR_DEPARTMENT_OTHER): Payer: Self-pay | Admitting: *Deleted

## 2014-10-08 DIAGNOSIS — B1920 Unspecified viral hepatitis C without hepatic coma: Secondary | ICD-10-CM | POA: Diagnosis not present

## 2014-10-08 DIAGNOSIS — M75101 Unspecified rotator cuff tear or rupture of right shoulder, not specified as traumatic: Secondary | ICD-10-CM | POA: Insufficient documentation

## 2014-10-08 DIAGNOSIS — M7541 Impingement syndrome of right shoulder: Secondary | ICD-10-CM | POA: Insufficient documentation

## 2014-10-08 DIAGNOSIS — I10 Essential (primary) hypertension: Secondary | ICD-10-CM | POA: Diagnosis not present

## 2014-10-08 DIAGNOSIS — M7501 Adhesive capsulitis of right shoulder: Secondary | ICD-10-CM | POA: Insufficient documentation

## 2014-10-08 DIAGNOSIS — F17210 Nicotine dependence, cigarettes, uncomplicated: Secondary | ICD-10-CM | POA: Diagnosis not present

## 2014-10-08 HISTORY — DX: Anxiety disorder, unspecified: F41.9

## 2014-10-08 HISTORY — PX: SHOULDER ARTHROSCOPY WITH ROTATOR CUFF REPAIR: SHX5685

## 2014-10-08 HISTORY — DX: Unspecified rotator cuff tear or rupture of right shoulder, not specified as traumatic: M75.101

## 2014-10-08 SURGERY — ARTHROSCOPY, SHOULDER, WITH ROTATOR CUFF REPAIR
Anesthesia: Regional | Site: Shoulder | Laterality: Right

## 2014-10-08 MED ORDER — KETOROLAC TROMETHAMINE 30 MG/ML IJ SOLN: 30.0000 mg | Freq: Once | INTRAMUSCULAR | Status: DC

## 2014-10-08 MED ORDER — LACTATED RINGERS IV SOLN
INTRAVENOUS | Status: DC
  Administered 2014-10-08: 10 mL/h via INTRAVENOUS
  Administered 2014-10-08: 08:00:00 via INTRAVENOUS

## 2014-10-08 MED ORDER — KETOROLAC TROMETHAMINE 30 MG/ML IJ SOLN
INTRAMUSCULAR | Status: DC | PRN
  Administered 2014-10-08: 30 mg via INTRAVENOUS

## 2014-10-08 MED ORDER — PROMETHAZINE HCL 25 MG/ML IJ SOLN: 6.2500 mg | INTRAMUSCULAR | Status: DC | PRN

## 2014-10-08 MED ORDER — KETOROLAC TROMETHAMINE 30 MG/ML IJ SOLN
INTRAMUSCULAR | Status: AC
  Filled 2014-10-08: qty 1

## 2014-10-08 MED ORDER — METHOCARBAMOL 500 MG PO TABS: 500.0000 mg | ORAL_TABLET | Freq: Four times a day (QID) | ORAL | Status: DC

## 2014-10-08 MED ORDER — ONDANSETRON HCL 4 MG/2ML IJ SOLN
INTRAMUSCULAR | Status: DC | PRN
  Administered 2014-10-08: 4 mg via INTRAVENOUS

## 2014-10-08 MED ORDER — FENTANYL CITRATE (PF) 100 MCG/2ML IJ SOLN
50.0000 ug | INTRAMUSCULAR | Status: DC | PRN
  Administered 2014-10-08: 100 ug via INTRAVENOUS

## 2014-10-08 MED ORDER — EPHEDRINE SULFATE 50 MG/ML IJ SOLN
INTRAMUSCULAR | Status: DC | PRN
  Administered 2014-10-08: 25 mg via INTRAVENOUS

## 2014-10-08 MED ORDER — DEXAMETHASONE SODIUM PHOSPHATE 4 MG/ML IJ SOLN
INTRAMUSCULAR | Status: DC | PRN
  Administered 2014-10-08: 10 mg via INTRAVENOUS

## 2014-10-08 MED ORDER — ONDANSETRON HCL 4 MG PO TABS: 4.0000 mg | ORAL_TABLET | Freq: Three times a day (TID) | ORAL | Status: DC | PRN

## 2014-10-08 MED ORDER — OXYCODONE HCL 5 MG/5ML PO SOLN: 5.0000 mg | Freq: Once | ORAL | Status: DC | PRN

## 2014-10-08 MED ORDER — DEXAMETHASONE SODIUM PHOSPHATE 10 MG/ML IJ SOLN
INTRAMUSCULAR | Status: AC
  Filled 2014-10-08: qty 1

## 2014-10-08 MED ORDER — LIDOCAINE HCL (CARDIAC) 20 MG/ML IV SOLN
INTRAVENOUS | Status: AC
  Filled 2014-10-08: qty 5

## 2014-10-08 MED ORDER — PROPOFOL 500 MG/50ML IV EMUL
INTRAVENOUS | Status: AC
  Filled 2014-10-08: qty 50

## 2014-10-08 MED ORDER — FENTANYL CITRATE (PF) 100 MCG/2ML IJ SOLN
INTRAMUSCULAR | Status: AC
  Filled 2014-10-08: qty 2

## 2014-10-08 MED ORDER — ARTIFICIAL TEARS OP OINT
TOPICAL_OINTMENT | OPHTHALMIC | Status: AC
  Filled 2014-10-08: qty 3.5

## 2014-10-08 MED ORDER — PROPOFOL 10 MG/ML IV BOLUS
INTRAVENOUS | Status: DC | PRN
  Administered 2014-10-08: 200 mg via INTRAVENOUS

## 2014-10-08 MED ORDER — LIDOCAINE HCL (CARDIAC) 10 MG/ML IV SOLN
INTRAVENOUS | Status: DC | PRN
  Administered 2014-10-08: 20 mg via INTRAVENOUS

## 2014-10-08 MED ORDER — CEFAZOLIN SODIUM-DEXTROSE 2-3 GM-% IV SOLR
2.0000 g | INTRAVENOUS | Status: AC
  Administered 2014-10-08: 2 g via INTRAVENOUS

## 2014-10-08 MED ORDER — SUCCINYLCHOLINE CHLORIDE 20 MG/ML IJ SOLN
INTRAMUSCULAR | Status: DC | PRN
  Administered 2014-10-08: 100 mg via INTRAVENOUS

## 2014-10-08 MED ORDER — CEFAZOLIN SODIUM-DEXTROSE 2-3 GM-% IV SOLR
INTRAVENOUS | Status: AC
  Filled 2014-10-08: qty 50

## 2014-10-08 MED ORDER — OXYCODONE HCL 5 MG PO TABS: 5.0000 mg | ORAL_TABLET | Freq: Once | ORAL | Status: DC | PRN

## 2014-10-08 MED ORDER — GLYCOPYRROLATE 0.2 MG/ML IJ SOLN
0.2000 mg | Freq: Once | INTRAMUSCULAR | Status: AC | PRN
  Administered 2014-10-08: 0.2 mg via INTRAVENOUS

## 2014-10-08 MED ORDER — SENNA-DOCUSATE SODIUM 8.6-50 MG PO TABS: 2.0000 | ORAL_TABLET | Freq: Every day | ORAL | Status: DC

## 2014-10-08 MED ORDER — HYDROMORPHONE HCL 1 MG/ML IJ SOLN: 0.2500 mg | INTRAMUSCULAR | Status: DC | PRN

## 2014-10-08 MED ORDER — EPHEDRINE SULFATE 50 MG/ML IJ SOLN
INTRAMUSCULAR | Status: AC
  Filled 2014-10-08: qty 1

## 2014-10-08 MED ORDER — ONDANSETRON HCL 4 MG/2ML IJ SOLN
INTRAMUSCULAR | Status: AC
  Filled 2014-10-08: qty 2

## 2014-10-08 MED ORDER — SUCCINYLCHOLINE CHLORIDE 20 MG/ML IJ SOLN
INTRAMUSCULAR | Status: AC
  Filled 2014-10-08: qty 1

## 2014-10-08 MED ORDER — SCOPOLAMINE 1 MG/3DAYS TD PT72: 1.0000 | MEDICATED_PATCH | Freq: Once | TRANSDERMAL | Status: DC | PRN

## 2014-10-08 MED ORDER — OXYCODONE HCL 5 MG PO TABS
5.0000 mg | ORAL_TABLET | ORAL | Status: DC | PRN
Start: 2014-10-08 — End: 2014-11-01

## 2014-10-08 MED ORDER — MIDAZOLAM HCL 2 MG/2ML IJ SOLN
INTRAMUSCULAR | Status: AC
  Filled 2014-10-08: qty 2

## 2014-10-08 MED ORDER — MIDAZOLAM HCL 2 MG/2ML IJ SOLN
1.0000 mg | INTRAMUSCULAR | Status: DC | PRN
  Administered 2014-10-08: 2 mg via INTRAVENOUS

## 2014-10-08 MED ORDER — SODIUM CHLORIDE 0.9 % IR SOLN
Status: DC | PRN
  Administered 2014-10-08: 9500 mL

## 2014-10-08 SURGICAL SUPPLY — 69 items
ANCH SUT SWLK 19.1X4.75 (Anchor) ×1 IMPLANT
ANCHOR SUT BIO SW 4.75X19.1 (Anchor) ×2 IMPLANT
BLADE CUTTER GATOR 3.5 (BLADE) ×3 IMPLANT
BLADE GREAT WHITE 4.2 (BLADE) IMPLANT
BLADE GREAT WHITE 4.2MM (BLADE)
BLADE SURG 15 STRL LF DISP TIS (BLADE) IMPLANT
BLADE SURG 15 STRL SS (BLADE)
BUR OVAL 6.0 (BURR) ×2 IMPLANT
CANNULA 5.75X71 LONG (CANNULA) ×3 IMPLANT
CANNULA TWIST IN 8.25X7CM (CANNULA) ×2 IMPLANT
CLOSURE STERI-STRIP 1/2X4 (GAUZE/BANDAGES/DRESSINGS) ×1
CLSR STERI-STRIP ANTIMIC 1/2X4 (GAUZE/BANDAGES/DRESSINGS) ×2 IMPLANT
DECANTER SPIKE VIAL GLASS SM (MISCELLANEOUS) IMPLANT
DRAPE INCISE IOBAN 66X45 STRL (DRAPES) ×3 IMPLANT
DRAPE SHOULDER BEACH CHAIR (DRAPES) ×3 IMPLANT
DRAPE U 20/CS (DRAPES) ×3 IMPLANT
DRAPE U-SHAPE 47X51 STRL (DRAPES) ×3 IMPLANT
DRSG PAD ABDOMINAL 8X10 ST (GAUZE/BANDAGES/DRESSINGS) ×3 IMPLANT
DURAPREP 26ML APPLICATOR (WOUND CARE) ×3 IMPLANT
ELECT REM PT RETURN 9FT ADLT (ELECTROSURGICAL)
ELECTRODE REM PT RTRN 9FT ADLT (ELECTROSURGICAL) IMPLANT
FIBERSTICK 2 (SUTURE) IMPLANT
GAUZE SPONGE 4X4 12PLY STRL (GAUZE/BANDAGES/DRESSINGS) ×3 IMPLANT
GLOVE BIO SURGEON STRL SZ8 (GLOVE) ×3 IMPLANT
GLOVE BIOGEL PI IND STRL 7.0 (GLOVE) IMPLANT
GLOVE BIOGEL PI IND STRL 8 (GLOVE) ×2 IMPLANT
GLOVE BIOGEL PI INDICATOR 7.0 (GLOVE) ×4
GLOVE BIOGEL PI INDICATOR 8 (GLOVE) ×4
GLOVE ECLIPSE 6.5 STRL STRAW (GLOVE) ×4 IMPLANT
GLOVE ORTHO TXT STRL SZ7.5 (GLOVE) ×3 IMPLANT
GOWN STRL REUS W/ TWL LRG LVL3 (GOWN DISPOSABLE) ×1 IMPLANT
GOWN STRL REUS W/ TWL XL LVL3 (GOWN DISPOSABLE) ×2 IMPLANT
GOWN STRL REUS W/TWL LRG LVL3 (GOWN DISPOSABLE) ×3
GOWN STRL REUS W/TWL XL LVL3 (GOWN DISPOSABLE) ×6
IMMOBILIZER SHOULDER FOAM XLGE (SOFTGOODS) IMPLANT
IV NS IRRIG 3000ML ARTHROMATIC (IV SOLUTION) ×8 IMPLANT
KIT SHOULDER TRACTION (DRAPES) ×3 IMPLANT
LASSO 90 CVE QUICKPAS (DISPOSABLE) IMPLANT
MANIFOLD NEPTUNE II (INSTRUMENTS) ×3 IMPLANT
NDL SCORPION MULTI FIRE (NEEDLE) ×1 IMPLANT
NEEDLE SCORPION MULTI FIRE (NEEDLE) ×3 IMPLANT
PACK ARTHROSCOPY DSU (CUSTOM PROCEDURE TRAY) ×3 IMPLANT
PACK BASIN DAY SURGERY FS (CUSTOM PROCEDURE TRAY) ×3 IMPLANT
SET ARTHROSCOPY TUBING (MISCELLANEOUS) ×3
SET ARTHROSCOPY TUBING LN (MISCELLANEOUS) ×1 IMPLANT
SHEET MEDIUM DRAPE 40X70 STRL (DRAPES) ×3 IMPLANT
SLEEVE SCD COMPRESS KNEE MED (MISCELLANEOUS) ×3 IMPLANT
SLING ARM IMMOBILIZER LRG (SOFTGOODS) IMPLANT
SLING ARM IMMOBILIZER MED (SOFTGOODS) IMPLANT
SLING ARM IMMOBILIZER XL (CAST SUPPLIES) ×2 IMPLANT
SLING ARM LRG ADULT FOAM STRAP (SOFTGOODS) IMPLANT
SLING ARM MED ADULT FOAM STRAP (SOFTGOODS) IMPLANT
SLING ARM XL FOAM STRAP (SOFTGOODS) IMPLANT
SUT FIBERWIRE #2 38 T-5 BLUE (SUTURE)
SUT MNCRL AB 4-0 PS2 18 (SUTURE) ×3 IMPLANT
SUT PDS AB 0 CT 36 (SUTURE) ×1 IMPLANT
SUT PDS AB 1 CT  36 (SUTURE)
SUT PDS AB 1 CT 36 (SUTURE) IMPLANT
SUT TIGER TAPE 7 IN WHITE (SUTURE) ×2 IMPLANT
SUT VIC AB 3-0 SH 27 (SUTURE)
SUT VIC AB 3-0 SH 27X BRD (SUTURE) IMPLANT
SUTURE FIBERWR #2 38 T-5 BLUE (SUTURE) IMPLANT
TAPE FIBER 2MM 7IN #2 BLUE (SUTURE) IMPLANT
TOWEL OR 17X24 6PK STRL BLUE (TOWEL DISPOSABLE) ×3 IMPLANT
TOWEL OR NON WOVEN STRL DISP B (DISPOSABLE) ×3 IMPLANT
TUBE CONNECTING 20'X1/4 (TUBING)
TUBE CONNECTING 20X1/4 (TUBING) ×1 IMPLANT
WAND STAR VAC 90 (SURGICAL WAND) ×3 IMPLANT
WATER STERILE IRR 1000ML POUR (IV SOLUTION) ×3 IMPLANT

## 2014-10-08 NOTE — Discharge Instructions (Signed)
Diet: As you were doing prior to hospitalization   Shower:  May shower but keep the wounds dry, use an occlusive plastic wrap, NO SOAKING IN TUB.  If the bandage gets wet, change with a clean dry gauze.  If you have a splint on, leave the splint in place and keep the splint dry with a plastic bag.  Dressing:  You may change your dressing 3-5 days after surgery, unless you have a splint.  If you have a splint, then just leave the splint in place and we will change your bandages during your first follow-up appointment.    If you had hand or foot surgery, we will plan to remove your stitches in about 2 weeks in the office.  For all other surgeries, there are sticky tapes (steri-strips) on your wounds and all the stitches are absorbable.  Leave the steri-strips in place when changing your dressings, they will peel off with time, usually 2-3 weeks.  Activity:  Increase activity slowly as tolerated, but follow the weight bearing instructions below.  The rules on driving is that you can not be taking narcotics while you drive, and you must feel in control of the vehicle.    Weight Bearing:   Sling at all times..    To prevent constipation: you may use a stool softener such as -  Colace (over the counter) 100 mg by mouth twice a day  Drink plenty of fluids (prune juice may be helpful) and high fiber foods Miralax (over the counter) for constipation as needed.    Itching:  If you experience itching with your medications, try taking only a single pain pill, or even half a pain pill at a time.  You may take up to 10 pain pills per day, and you can also use benadryl over the counter for itching or also to help with sleep.   Precautions:  If you experience chest pain or shortness of breath - call 911 immediately for transfer to the hospital emergency department!!  If you develop a fever greater that 101 F, purulent drainage from wound, increased redness or drainage from wound, or calf pain -- Call the  office at [phone]                                                 Follow- Up Appointment:  Please call for an appointment to be seen in 2 weeks [facility] - [phone]  Pain medications: You can use over-the-counter Tylenol, a maximum of 3000, or even at most 4000 mg per day. When the Tylenol is not adequately controlling your pain, you can then rely on the oxycodone that I have prescribed. We are trying to minimize the oxycodone to minimize narcotic exposure postoperatively, based on our mutual discussion.  You may also use the Robaxin as a muscle spasm medicine, which is a non-addictive medication.   Regional Anesthesia Blocks  1. Numbness or the inability to move the "blocked" extremity may last from 3-48 hours after placement. The length of time depends on the medication injected and your individual response to the medication. If the numbness is not going away after 48 hours, call your surgeon.  2. The extremity that is blocked will need to be protected until the numbness is gone and the  Strength has returned. Because you cannot feel it, you will need to take extra  care to avoid injury. Because it may be weak, you may have difficulty moving it or using it. You may not know what position it is in without looking at it while the block is in effect.  3. For blocks in the legs and feet, returning to weight bearing and walking needs to be done carefully. You will need to wait until the numbness is entirely gone and the strength has returned. You should be able to move your leg and foot normally before you try and bear weight or walk. You will need someone to be with you when you first try to ensure you do not fall and possibly risk injury.  4. Bruising and tenderness at the needle site are common side effects and will resolve in a few days.  5. Persistent numbness or new problems with movement should be communicated to the surgeon or the [place] [phone] [place] [phone]).  Post Anesthesia Home Care Instructions  Activity: Get plenty of rest for the remainder of the day. A responsible adult should stay with you for 24 hours following the procedure.  For the next 24 hours, DO NOT: -Drive a car -Paediatric nurse -Drink alcoholic beverages -Take any medication unless instructed by your physician -Make any legal decisions or sign important papers.  Meals: Start with liquid foods such as gelatin or soup. Progress to regular foods as tolerated. Avoid greasy, spicy, heavy foods. If nausea and/or vomiting occur, drink only clear liquids until the nausea and/or vomiting subsides. Call your physician if vomiting continues.  Special Instructions/Symptoms: Your throat may feel dry or sore from the anesthesia or the breathing tube placed in your throat during surgery. If this causes discomfort, gargle with warm salt water. The discomfort should disappear within 24 hours.  If you had a scopolamine patch placed behind your ear for the management of post- operative nausea and/or vomiting:  1. The medication in the patch is effective for 72 hours, after which it should be removed.  Wrap patch in a tissue and discard in the trash. Wash hands thoroughly with soap and water. 2. You may remove the patch earlier than 72 hours if you experience unpleasant side effects which may include dry mouth, dizziness or visual disturbances. 3. Avoid touching the patch. Wash your hands with soap and water after contact with the patch.

## 2014-10-08 NOTE — Anesthesia Postprocedure Evaluation (Signed)
  Anesthesia Post-op Note  Patient: Kevin Leon  Procedure(s) Performed: Procedure(s): RIGHT SHOULDER ARTHROSCOPY, DEBRIDEMENT, ACROMIOPLASTY WITH ROTATOR CUFF REPAIR (Right)  Patient Location: PACU  Anesthesia Type:GA combined with regional for post-op pain  Level of Consciousness: awake and alert   Airway and Oxygen Therapy: Patient Spontanous Breathing  Post-op Pain: none  Post-op Assessment: Post-op Vital signs reviewed              Post-op Vital Signs: Reviewed  Last Vitals:  Filed Vitals:   10/08/14 0945  BP: 160/74  Pulse: 70  Temp:   Resp: 15    Complications: No apparent anesthesia complications

## 2014-10-08 NOTE — Op Note (Addendum)
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  ANESTHESIA:   General  PREOPERATIVE INDICATIONS:  [name] is a  64 y.o. male with a diagnosis of other articular cartilage disorders, right shoulder, impingement syndrome of right shoulder, strain of muscles and tendons of the rotator cuff of right shoulder,  M24.111 who failed conservative measures and elected for surgical management.    The risks benefits and alternatives were discussed with the patient preoperatively including but not limited to the risks of infection, bleeding, nerve injury, cardiopulmonary complications, the need for revision surgery, among others, and the patient was willing to proceed. We also discussed the risks for incomplete relief of pain, recurrent rotator cuff pathology and tear, stiffness, loss of function, among others.  OPERATIVE IMPLANTS: Arthrex bio composite 4.75 mm swivel lock 1 with inverted fiber tape anteriorly in an inverted fiber wire posteriorly.  OPERATIVE FINDINGS: There was a medium size rotator cuff tear with significant tendinopathy, but without significant retraction. Bone quality was excellent. Moderate size subacromial spurring. Shoulder had a slight amount of stiffness at the extremes of motion, as if there was an early element of adhesive capsulitis, although I was able to get up to 180 of motion without any significant capsular release. The glenohumeral articular cartilage was normal, the biceps tendon was normal, subscapularis was normal, the supraspinatus  had a complex tear that was at the mid zone, in fact somewhat more posterior than the biceps, more at the junction of the infraspinatus and supraspinatus tendon at the tuberosity.  OPERATIVE PROCEDURE: The patient is brought to the operating room and placed in the supine position. Gen. anesthesia was administered. IV antibiotics were given. He was turned in the semilateral decubitus position and all bony prominences padded. Timeout was performed. The right upper extremity was prepped and draped in usual sterile fashion. Diagnostic arthroscopy carried out the above-named findings. The arthroscopic shaver was used to debride slight areas of the undersurface of the rotator cuff, and I'll also closely inspected labrum throughout and was found to be intact. Superior labrum was intact.  I then went to the subacromial space, performed a debridement of the torn portions of the rotator cuff as well as complete bursectomy, CA ligament release, moderate acromioplasty, and tuberoplasty with a bur.  I then used a scorpion suture passer to repair the supraspinatus with an inverted fiber wire anteriorly in an inverted fiber tape posteriorly taking care not to ensnare the biceps tendon.  This anchored nicely down to bone, removed the sutures and then viewed the acromioplasty from lateral, had excellent smooth contour, removed the instruments and repaired the portals with Monocryl followed by Steri-Strips and sterile gauze. He was awakened and returned to the PACU in stable and satisfactory condition. There were no complications and he tolerated the procedure well.

## 2014-10-08 NOTE — H&P (Signed)
PREOPERATIVE H&P  Chief Complaint: other articular cartilage disorders, right shoulder, impingement syndrome of right shoulder, strain of muscles and tendons of the rotator cuff of right shoulder, initial encounter  M24.111  HPI: Kevin Leon is a 64 y.o. male who presents for preoperative history and physical with a diagnosis of other articular cartilage disorders, right shoulder, impingement syndrome of right shoulder, strain of muscles and tendons of the rotator cuff of right shoulder, initial encounter  M24.111. Symptoms are rated as moderate to severe, and have been worsening.  This is significantly impairing activities of daily living.  He has elected for surgical management.   He has failed activity modification, injections, and continues to have persistent moderate to severe pain with overhead activity over the right shoulder.  Past Medical History  Diagnosis Date  . HTN (hypertension)   . Hepatitis C   . Chest pain   . Anxiety    Past Surgical History  Procedure Laterality Date  . Tonsillectomy    . Large piece of wood removed from buttocks      as child   Social History   Social History  . Marital Status: Married    Spouse Name: [name]  . Number of Children: [name]  . Years of Education: [name]   Social History Main Topics  . Smoking status: Current Every Day Smoker -- 0.50 packs/day    Types: Cigarettes    Last Attempt to Quit: 02/15/2014  . Smokeless tobacco: Never Used  . Alcohol Use: 0.0 oz/week    0 Standard drinks or equivalent per week     Comment:  2 beers daily  . 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 Sexual Activity: Yes   Other Topics Concern  . None   Social History Narrative   Family History  Problem Relation Age of Onset  . Hypertension Mother    No Known Allergies Prior to Admission medications   Medication Sig Start Date End Date Taking? Authorizing Provider  carvedilol (COREG) 6.25 MG tablet  Take 1 tablet (6.25 mg total) by mouth 2 (two) times daily with a meal. 07/16/14  Yes Thayer Headings, MD  lisinopril (PRINIVIL,ZESTRIL) 10 MG tablet Take 1 tablet (10 mg total) by mouth daily. 07/16/14  Yes Thayer Headings, MD  LORazepam (ATIVAN) 0.5 MG tablet Take 0.5 mg by mouth every 6 (six) hours as needed.  04/21/11  Yes Historical Provider, MD     Positive ROS: All other systems have been reviewed and were otherwise negative with the exception of those mentioned in the HPI and as above.  Physical Exam: General: Alert, no acute distress Cardiovascular: No pedal edema Respiratory: No cyanosis, no use of accessory musculature GI: No organomegaly, abdomen is soft and non-tender Skin: No lesions in the area of chief complaint Neurologic: Sensation intact distally Psychiatric: Patient is competent for consent with normal mood and affect Lymphatic: No axillary or cervical lymphadenopathy  MUSCULOSKELETAL: Right shoulder active motion is 0-175 with supraspinatus weakness, no before meals joint pain, positive impingement signs.  Assessment: other articular cartilage disorders, right shoulder, impingement syndrome of right shoulder, strain of muscles and tendons of the rotator cuff of right shoulder, initial encounter  M24.111   Plan: Plan for Procedure(s): RIGHT SHOULDER ARTHROSCOPY, DEBRIDEMENT, ACROMIOPLASTY WITH ROTATOR CUFF REPAIR  The risks benefits and alternatives were discussed with the patient including but not limited to the risks of nonoperative treatment, versus  surgical intervention including infection, bleeding, nerve injury,  blood clots, cardiopulmonary complications, morbidity, mortality, among others, and they were willing to proceed.   Johnny Bridge, MD Cell [phone]   10/08/2014 7:28 AM

## 2014-10-08 NOTE — Anesthesia Procedure Notes (Addendum)
Anesthesia Regional Block:  Interscalene brachial plexus block  Pre-Anesthetic Checklist: ,, timeout performed, Correct Patient, Correct Site, Correct Laterality, Correct Procedure, Correct Position, site marked, Risks and benefits discussed,  Surgical consent,  Pre-op evaluation,  At surgeon's request and post-op pain management  Laterality: Right  Prep: chloraprep       Needles:  Injection technique: Single-shot  Needle Type: Echogenic Stimulator Needle     Needle Length: 9cm 9 cm Needle Gauge: 21 and 21 G    Additional Needles:  Procedures: ultrasound guided (picture in chart) and nerve stimulator Interscalene brachial plexus block  Nerve Stimulator or Paresthesia:  Response: deltoid, 0.5 mA,   Additional Responses:   Narrative:  Start time: 10/08/2014 7:10 AM End time: 10/08/2014 7:20 AM Injection made incrementally with aspirations every 5 mL.  Performed by: Personally  Anesthesiologist: Suzette Battiest  Additional Notes: Risks and benefits discussed. Pt tolerated well with no immediate complications.   Procedure Name: Intubation Date/Time: 10/08/2014 7:40 AM Performed by: Lyndee Leo Pre-anesthesia Checklist: Patient identified, Emergency Drugs available, Suction available and Patient being monitored Patient Re-evaluated:Patient Re-evaluated prior to inductionOxygen Delivery Method: Circle System Utilized Preoxygenation: Pre-oxygenation with 100% oxygen Intubation Type: IV induction Ventilation: Mask ventilation without difficulty Laryngoscope Size: Miller and 3 Grade View: Grade IV Tube type: Oral Number of attempts: 1 Airway Equipment and Method: Stylet and Oral airway Placement Confirmation: ETT inserted through vocal cords under direct vision,  positive ETCO2 and breath sounds checked- equal and bilateral Tube secured with: Tape Dental Injury: Teeth and Oropharynx as per pre-operative assessment

## 2014-10-08 NOTE — Transfer of Care (Signed)
Immediate Anesthesia Transfer of Care Note  Patient: Kevin Leon  Procedure(s) Performed: Procedure(s): RIGHT SHOULDER ARTHROSCOPY, DEBRIDEMENT, ACROMIOPLASTY WITH ROTATOR CUFF REPAIR (Right)  Patient Location: PACU  Anesthesia Type:GA combined with regional for post-op pain  Level of Consciousness: awake, sedated and patient cooperative  Airway & Oxygen Therapy: Patient Spontanous Breathing and Patient connected to face mask oxygen  Post-op Assessment: Report given to RN and Post -op Vital signs reviewed and stable  Post vital signs: Reviewed and stable  Last Vitals:  Filed Vitals:   10/08/14 0725  BP:   Pulse: 56  Temp:   Resp: 16    Complications: No apparent anesthesia complications

## 2014-10-08 NOTE — Anesthesia Preprocedure Evaluation (Addendum)
Anesthesia Evaluation  Patient identified by MRN, date of birth, ID band Patient awake    Reviewed: Allergy & Precautions, NPO status , Patient's Chart, lab work & pertinent test results, reviewed documented beta blocker date and time   Airway Mallampati: I  TM Distance: >3 FB Neck ROM: Full    Dental  (+) Dental Advisory Given   Pulmonary Current Smoker,    breath sounds clear to auscultation       Cardiovascular hypertension, Pt. on medications and Pt. on home beta blockers  Rhythm:Regular Rate:Normal     Neuro/Psych Anxiety negative neurological ROS     GI/Hepatic negative GI ROS, (+) Hepatitis -, C  Endo/Other  negative endocrine ROS  Renal/GU negative Renal ROS     Musculoskeletal   Abdominal   Peds  Hematology negative hematology ROS (+)   Anesthesia Other Findings   Reproductive/Obstetrics                            Anesthesia Physical Anesthesia Plan  ASA: II  Anesthesia Plan: General and Regional   Post-op Pain Management: GA combined w/ Regional for post-op pain   Induction: Intravenous  Airway Management Planned: Oral ETT  Additional Equipment:   Intra-op Plan:   Post-operative Plan: Extubation in OR  Informed Consent: I have reviewed the patients History and Physical, chart, labs and discussed the procedure including the risks, benefits and alternatives for the proposed anesthesia with the patient or authorized representative who has indicated his/her understanding and acceptance.   Dental advisory given  Plan Discussed with: CRNA  Anesthesia Plan Comments:         Anesthesia Quick Evaluation

## 2014-10-08 NOTE — Progress Notes (Signed)
Assisted Dr. Rob Fitzgerald with right, ultrasound guided, supraclavicular block. Side rails up, monitors on throughout procedure. See vital signs in flow sheet. Tolerated Procedure well. 

## 2014-10-11 ENCOUNTER — Encounter (HOSPITAL_BASED_OUTPATIENT_CLINIC_OR_DEPARTMENT_OTHER): Payer: Self-pay | Admitting: Orthopedic Surgery

## 2014-11-01 ENCOUNTER — Encounter: Payer: Self-pay | Admitting: Podiatry

## 2014-11-01 ENCOUNTER — Ambulatory Visit (INDEPENDENT_AMBULATORY_CARE_PROVIDER_SITE_OTHER): Payer: 59 | Admitting: Podiatry

## 2014-11-01 VITALS — BP 132/69 | HR 60 | Resp 16

## 2014-11-01 DIAGNOSIS — M779 Enthesopathy, unspecified: Secondary | ICD-10-CM | POA: Diagnosis not present

## 2014-11-01 DIAGNOSIS — L84 Corns and callosities: Secondary | ICD-10-CM | POA: Diagnosis not present

## 2014-11-01 DIAGNOSIS — M216X9 Other acquired deformities of unspecified foot: Secondary | ICD-10-CM | POA: Diagnosis not present

## 2014-11-01 NOTE — Progress Notes (Signed)
   Subjective:    Patient ID: Kevin Leon, male    DOB: 1950/05/12, 64 y.o.   MRN: [id number]  HPI "It's not real good.  He's done it twice but the last time he didn't trim enough."    Review of Systems     Objective:   Physical Exam        Assessment & Plan:

## 2014-11-02 NOTE — Progress Notes (Signed)
Subjective:     Patient ID: Kevin Leon, male   DOB: 08-Dec-1950, 64 y.o.   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

## 2014-11-03 ENCOUNTER — Ambulatory Visit: Payer: BLUE CROSS/BLUE SHIELD | Admitting: Podiatry

## 2014-11-26 ENCOUNTER — Ambulatory Visit: Payer: 59 | Admitting: *Deleted

## 2014-11-26 DIAGNOSIS — M779 Enthesopathy, unspecified: Secondary | ICD-10-CM

## 2014-11-26 NOTE — Patient Instructions (Signed)

## 2014-11-26 NOTE — Progress Notes (Signed)
Patient ID: Kevin Leon, male   DOB: September 21, 1950, 64 y.o.   MRN: [id number] Patient presents for orthotic pick up.  Verbal and written break in and wear instructions given.  Patient will follow up in 4 weeks if symptoms worsen or fail to improve.

## 2014-12-24 ENCOUNTER — Encounter: Payer: Self-pay | Admitting: Podiatry

## 2014-12-24 ENCOUNTER — Ambulatory Visit (INDEPENDENT_AMBULATORY_CARE_PROVIDER_SITE_OTHER): Payer: 59 | Admitting: Podiatry

## 2014-12-24 VITALS — BP 155/76 | HR 61 | Resp 16

## 2014-12-24 DIAGNOSIS — M779 Enthesopathy, unspecified: Secondary | ICD-10-CM

## 2014-12-24 DIAGNOSIS — M216X9 Other acquired deformities of unspecified foot: Secondary | ICD-10-CM

## 2014-12-24 DIAGNOSIS — L84 Corns and callosities: Secondary | ICD-10-CM | POA: Diagnosis not present

## 2014-12-26 NOTE — Progress Notes (Signed)
Subjective:     Patient ID: Kevin Leon, male   DOB: December 10, 1950, 64 y.o.   MRN: [id number]  HPI patient states it's puts feeling quite a bit better with lesions still present but improved   Review of Systems     Objective:   Physical Exam Neurovascular status intact with plantarflexed metatarsal fourth right with keratotic lesion present with orthotics that are helping reducing symptoms    Assessment:     Continued plantarflexed metatarsal right with keratotic lesion    Plan:     Debrided the lesion and advised on her types shoes and continued orthotic usage and will be seen back as needed

## 2015-05-19 ENCOUNTER — Other Ambulatory Visit: Payer: Self-pay | Admitting: Cardiovascular Disease

## 2015-07-22 ENCOUNTER — Ambulatory Visit (INDEPENDENT_AMBULATORY_CARE_PROVIDER_SITE_OTHER): Payer: Medicare Other | Admitting: Cardiovascular Disease

## 2015-07-22 ENCOUNTER — Encounter: Payer: Self-pay | Admitting: Cardiovascular Disease

## 2015-07-22 VITALS — BP 150/80 | HR 61 | Ht 72.0 in | Wt 160.4 lb

## 2015-07-22 DIAGNOSIS — I10 Essential (primary) hypertension: Secondary | ICD-10-CM

## 2015-07-22 MED ORDER — CARVEDILOL 6.25 MG PO TABS: ORAL_TABLET | ORAL | Status: DC

## 2015-07-22 MED ORDER — LOSARTAN POTASSIUM 50 MG PO TABS: 50.0000 mg | ORAL_TABLET | Freq: Every day | ORAL | Status: DC

## 2015-07-22 NOTE — Patient Instructions (Signed)
Medication Instructions:  STOP Lisinopril START Losartan (Cozaar) 50 mg once daily   Labwork: Your physician recommends that you return for lab work in: 1 month for basic metabolic panel   Testing/Procedures: None Ordered   Follow-Up: Your physician recommends that you schedule a follow-up appointment in: 1 month for Nurse visit/BP check   Your physician wants you to follow-up in: 1 year with Dr. Acie Fredrickson.  You will receive a reminder letter in the mail two months in advance. If you don't receive a letter, please call our office to schedule the follow-up appointment.   If you need a refill on your cardiac medications before your next appointment, please call your pharmacy.   Thank you for choosing [facility]! Christen Bame, RN [phone]

## 2015-07-22 NOTE — Progress Notes (Signed)
Kevin Leon Date of Birth  [birth year] [facility]       [address]    [place]    [place], [place]  [address]        Problem list: 1. Hypertension 2. Hepatitis C 3. Chest pain  History of Present Illness:  Kevin Leon is a 65 y.o. gentleman with the above noted history.  He has done well since I last saw him a year ago.  He stopped smoking for 3 months but then restarted again after 3 months.   We performed a stress echocardiogram in 2010. He walked for 5 minutes and 14 seconds of an advanced Bruce protocol. He achieved a peak heart rate 151. He had no ST or T wave abnormalities The echo images were normal. He had no evidence of ischemia.  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  He still drinks lots of beer.  This may account for his BP variability.  Mar 13, 2013:  He has done well.  Slightly anxious about his BP today. He has continued to smoke.   He stopped for 3 months but restarted back after his mother died.  He has cut back on her beer intake.    Jun 11, 2013:  Kevin Leon is doing well.  BP has been much better.  Lisinopril has been well tolerated.   He has had a slight cough but it's not continuous.  Doubt its due to ACE-I.   July  1,2016: Quit smoking for 4 months, then restarted.  Is back on the patches now.   Felt much better when he was not smoking. BP was much better.  07/22/2015:  Feels great ,  BP readsing have been a little high Still trying to quit smoking   Current Outpatient Prescriptions on File Prior to Visit  Medication Sig Dispense Refill  . carvedilol (COREG) 6.25 MG tablet TAKE 1 TABLET (6.25 MG TOTAL) BY MOUTH 2 (TWO) TIMES DAILY WITH A MEAL. 180 tablet 0  . lisinopril (PRINIVIL,ZESTRIL) 10 MG tablet TAKE 1 TABLET (10 MG TOTAL) BY MOUTH DAILY. 90 tablet 0  . LORazepam (ATIVAN) 0.5 MG tablet Take 0.5 mg by mouth every 6 (six) hours as needed.      No current facility-administered medications on  file prior to visit.    No Known Allergies  Past Medical History  Diagnosis Date  . HTN (hypertension)   . Hepatitis C   . Chest pain   . Anxiety   . Right rotator cuff tear 10/08/2014    Past Surgical History  Procedure Laterality Date  . Tonsillectomy    . Large piece of wood removed from buttocks      as child  . Shoulder arthroscopy with rotator cuff repair Right 10/08/2014    Procedure: RIGHT SHOULDER ARTHROSCOPY, DEBRIDEMENT, ACROMIOPLASTY WITH ROTATOR CUFF REPAIR;  Surgeon: Marchia Bond, MD;  Location: [facility];  Service: Orthopedics;  Laterality: Right;    History  Smoking status  . Current Every Day Smoker -- 0.50 packs/day  . Types: Cigarettes  . Last Attempt to Quit: 02/15/2014  Smokeless tobacco  . Never Used    History  Alcohol Use  . 0.0 oz/week  . 0 Standard drinks or equivalent per week    Comment:  2 beers daily    Family History  Problem Relation Age of Onset  . Hypertension Mother     Reviw of Systems:  Reviewed  in the HPI.  All other systems are negative.  Physical Exam: Blood pressure 150/80, pulse 61, height 6' (1.829 m), weight 160 lb 6.4 oz (72.757 kg). General: Well developed, well nourished, in no acute distress.  Head: Normocephalic, atraumatic, sclera non-icteric, mucus membranes are moist,  Neck: Supple. Carotids are 2 + without bruits. No JVD Lungs: Clear bilaterally to auscultation. Heart: regular rate.  normal  S1 S2. No murmurs, gallops or rubs. Abdomen: Soft, non-tender, non-distended with normal bowel sounds. No hepatomegaly. No rebound/guarding. No masses. Msk:  Strength and tone are normal Extremities: No clubbing or cyanosis. No edema.  Distal pedal pulses are 2+ and equal bilaterally. Neuro: Alert and oriented X 3. Moves all extremities spontaneously. Psych:  Responds to questions appropriately with a normal affect.  ECG: July 22, 2015:  NSR at 44.  Normal ECG  Assessment / Plan:   1. Hypertension  - BP is a bit elevated. We will discontinue the lisinopril and start him on losartan 50 m g a day.  We will have him return in one month for a nurse visit and basic medical profile.  2. Hepatitis C  3. Chest pain - no further episodes of cp Ill see him in 1 year   Mertie Moores, MD  07/22/2015 11:54 AM    [place],  [place], [place]  [address] Pager [phone] Phone: [phone]; Fax: [phone]   [facility]  [address] [place], [place]  [address] [phone]    Fax [phone]

## 2015-08-18 ENCOUNTER — Telehealth: Payer: Self-pay | Admitting: Cardiovascular Disease

## 2015-08-18 ENCOUNTER — Other Ambulatory Visit: Payer: Self-pay | Admitting: Cardiovascular Disease

## 2015-08-18 NOTE — Telephone Encounter (Signed)
New message      Pt c/o medication issue:  1. Name of Medication: Losartan  2. How are you currently taking this medication (dosage and times per day)? 50 mg po daily  3. Are you having a reaction (difficulty breathing--STAT)? no  4. What is your medication issue? Per pt he is having difficulty with the medication

## 2015-08-18 NOTE — Telephone Encounter (Signed)
errror

## 2015-08-18 NOTE — Telephone Encounter (Signed)
Helene Kelp, NP called to discuss the patient's dizziness, syncope, and atrial fibrillation.  She states the patient had a near syncopal event Monday and she saw him yesterday after an episode of severe lightheadedness.  She reports his orthostatics were negative at [facility]. Informed Helene Kelp, NP that the patient has been spoken to in great detail and a message has already been sent to Dr. Acie Fredrickson for recommendations.   She is sending over OV note and EKG for review.

## 2015-08-18 NOTE — Telephone Encounter (Signed)
Pt should be set up for an appt with me or APP for further evaluation of this new onset AF

## 2015-08-18 NOTE — Telephone Encounter (Signed)
New message     The NP-Teresa at [facility], is calling to speak with a nurse concerning new on-set A-Fib   The NP is asking for a nurse to call so she can give detail and discuss the pt.

## 2015-08-18 NOTE — Telephone Encounter (Signed)
Patient called to report mild dizziness since stopping Lisinopril and starting Losartan. He states he has religiously checked his BP since the change. His systolic stays between 99991111 but he is concerned because the diastolic number fluctuates from 60-110. His HR stays between 60-90 but his BP monitor has been showing "irregular HR indicator." On Monday, he had "one too many beers" (about 5) and fell asleep on the couch. He woke up to go to the bathroom and to brush his teeth, and he "found himself on the floor." He reports he never lost consciousness, but called it a fainting spell. He bruised his shoulder, scraped his chin, and bumped his head on the door on the way down.  He went to his PCP afterward for evaluation. An EKG was done and he was diagnosed with atrial fibrillation. He was started on Eliquis 5 mg BID and was told not to drive until cleared by Cardiology. He states he has been really stressed lately and is even more stressed now because his wife cannot work so she can drive him and take care of their horses since he is unable. He wants to switch back to Lisinopril and stop smoking and drinking for BP control since he felt much better on that drug. The patient is frustrated with his "growing list of medical problems" and requests Dr. Acie Fredrickson review medications and give further recommendations so he may feel better and be "normal" again.  OV notes and EKG requested from PCP and med list updated.  To Dr. Acie Fredrickson.

## 2015-08-19 MED ORDER — LISINOPRIL 10 MG PO TABS: 10.0000 mg | ORAL_TABLET | Freq: Every day | ORAL | 3 refills | Status: DC

## 2015-08-19 NOTE — Telephone Encounter (Signed)
We changed from Lisinopril 10 mg a day to Losartan 50 mg a day because his BP was elevated.   He may change back to Lisinopril 10 mg  His BP will improve if he stops smoking  I dont think this has anything to do with his atrial fib

## 2015-08-19 NOTE — Telephone Encounter (Signed)
Follow-up      Returning the nurses call from yesterday.

## 2015-08-19 NOTE — Telephone Encounter (Signed)
Spoke with pt and informed him that Dr. Acie Fredrickson said ok to switch back to Lisinopril 10mg  QD.  Also advised pt he said smoking would help improve BP as well.  Pt still states that he will quit smoking.  Pt very appreciative for call.

## 2015-08-19 NOTE — Telephone Encounter (Signed)
Spoke with pt and scheduled him to see Richardson Dopp, PA-C on Monday at 11:45AM.  Pt states that he would like to stop the Losartan today and go back on the Lisinopril. He does not want to wait until he is seen on Monday. Pt states Losartan is making him dizzy and lightheaded and he doesn't want to deal with this anymore. Pt feels that Losartan maybe the reason he suddenly has AFib.  Pt states he is thinking about just stopping the Losartan himself but does not really want to go against Dr. Elmarie Shiley orders.  Pt states that he is willing to stop smoking as well.  Pt takes his Losartan at 2pm and would like to know if he can switch back to Lisinopril before then if possible.  Pt would like a call back at 2pm if we have not heard back from Dr. Acie Fredrickson yet. Will route message to Dr. Acie Fredrickson for review and advisement.

## 2015-08-21 NOTE — Progress Notes (Signed)
Cardiology Office Note:    Date:  08/22/2015   ID:  Kevin Leon, DOB [birth year], MRN [id number]  PCP:  Vicenta Aly, FNP  Cardiologist:  Dr. Liam Rogers   Electrophysiologist:  n/a  Referring MD: Vicenta Aly, [place]   Chief Complaint  Patient presents with  . Atrial Fibrillation   History of Present Illness:    Kevin Leon is a 65 y.o. male with a hx of HTN, hepatitis C, tobacco abuse. Stress echo in 2010 with normal echo images. Last seen by Dr. Acie Fredrickson 7/17.   He was seen by primary care last week with complaints of near syncope. Notes indicate that he did fall. ECG was noted to demonstrate atrial fibrillation he was placed on Apixaban 5 mg twice a day. Labs 08/19/15: Hgb 16.0, BUN 7, creatinine 0.84, TSH 1.310, K 4.7, Na 133, ALT 18.  ECG from 08/17/15: Atrial fibrillation, HR 88, normal axis .  Patient is here today with his wife. He tells me that, on the day he passed out, he had been outside mowing in the extreme heat. He also had 5-6 beers that day. When he awoke that evening to go to bed, he stood up and felt dizzy. While urinating he felt dizzy and near syncopal. Shortly after this, he went to go brush his teeth and then he passed out. This was brief and only lasted for seconds. He has not had recurrent symptoms. He has felt lightheaded and tired. His blood pressure machine did demonstrate a regular pulse on one day this past weekend. He did feel better on that day. Other than his blood pressure machine, he is not aware that he is in atrial fibrillation. He does note increased anxiety. He does snore mildly but there have been no witnessed apneic episodes. He denies chest discomfort. He denies shortness of breath. He denies orthopnea, PND or edema. He denies any bleeding issues. He has been adherent with Apixaban since last week.   Prior CV studies that were reviewed today include:    None   Past Medical History:  Diagnosis Date  . Anxiety   . Chest pain   . Hepatitis  C   . HTN (hypertension)   . Right rotator cuff tear 10/08/2014    Past Surgical History:  Procedure Laterality Date  . large piece of wood removed from buttocks     as child  . SHOULDER ARTHROSCOPY WITH ROTATOR CUFF REPAIR Right 10/08/2014   Procedure: RIGHT SHOULDER ARTHROSCOPY, DEBRIDEMENT, ACROMIOPLASTY WITH ROTATOR CUFF REPAIR;  Surgeon: Marchia Bond, MD;  Location: [facility];  Service: Orthopedics;  Laterality: Right;  . TONSILLECTOMY      Current Medications: Outpatient Medications Prior to Visit  Medication Sig Dispense Refill  . apixaban (ELIQUIS) 5 MG TABS tablet Take 5 mg by mouth 2 (two) times daily.    . carvedilol (COREG) 6.25 MG tablet TAKE 1 TABLET (6.25 MG TOTAL) BY MOUTH 2 (TWO) TIMES DAILY WITH A MEAL. 180 tablet 3  . lisinopril (PRINIVIL,ZESTRIL) 10 MG tablet Take 1 tablet (10 mg total) by mouth daily. 90 tablet 3  . LORazepam (ATIVAN) 0.5 MG tablet Take 0.5 mg by mouth every 6 (six) hours as needed for anxiety.      No facility-administered medications prior to visit.       Allergies:   Review of patient's allergies indicates no known allergies.   Social History   Social History  . Marital status: Married    Spouse name: [name]  .  Number of children: [name]  . Years of education: [name]   Social History Main Topics  . Smoking status: Current Every Day Smoker    Packs/day: 0.50    Types: Cigarettes    Last attempt to quit: 02/15/2014  . Smokeless tobacco: Never Used  . Alcohol use 0.0 oz/week     Comment:  2 beers daily  . 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 Sexual activity: Yes   Other Topics Concern  . None   Social History Narrative  . None     Family History:  The patient's family history includes Hypertension in his mother.   ROS:   Please see the history of present illness.    Review of Systems  Cardiovascular: Positive for irregular heartbeat.  Neurological: Positive for dizziness.    Psychiatric/Behavioral: The patient is nervous/anxious.    All other systems reviewed and are negative.   EKGs/Labs/Other Test Reviewed:    EKG:  EKG is  ordered today.  The ekg ordered today demonstrates Atrial fibrillation, HR 76, normal axis, septal Q waves, QTc 420 ms  Recent Labs: No results found for requested labs within last 8760 hours.   Recent Lipid Panel    Component Value Date/Time   CHOL 151 04/03/2013 0840   TRIG 19.0 04/03/2013 0840   HDL 84.50 04/03/2013 0840   CHOLHDL 2 04/03/2013 0840   VLDL 3.8 04/03/2013 0840   LDLCALC 63 04/03/2013 0840   Notes from recent primary care office visit, ECG and labs reviewed today.  Physical Exam:    VS:  BP 130/90   Pulse 76   Ht 6' (1.829 m)   Wt 161 lb (73 kg)   BMI 21.84 kg/m     Wt Readings from Last 3 Encounters:  08/22/15 161 lb (73 kg)  07/22/15 160 lb 6.4 oz (72.8 kg)  10/08/14 166 lb 12.8 oz (75.7 kg)     Physical Exam  Constitutional: He is oriented to person, place, and time. He appears well-developed and well-nourished. No distress.  HENT:  Head: Normocephalic and atraumatic.  Eyes: No scleral icterus.  Neck: Normal range of motion. No JVD present. Carotid bruit is not present.  Cardiovascular: Normal rate, S1 normal and S2 normal.  An irregularly irregular rhythm present. Exam reveals no gallop and no friction rub.   No murmur heard. Pulmonary/Chest: Effort normal and breath sounds normal. He has no wheezes. He has no rhonchi. He has no rales.  Abdominal: Soft. He exhibits no distension and no mass. There is no tenderness.  Musculoskeletal: Normal range of motion. He exhibits no edema.  Lymphadenopathy:    He has no cervical adenopathy.  Neurological: He is alert and oriented to person, place, and time.  Skin: Skin is warm and dry.  Psychiatric: He has a normal mood and affect.    ASSESSMENT:    1. Paroxysmal atrial fibrillation (HCC)   2. Vasovagal syncope   3. Essential hypertension   4.  Tobacco abuse    PLAN:    In order of problems listed above:  1. PAF - The patient is in atrial fibrillation with controlled ventricular rate. He does feel lightheaded and fatigued. I suspect that he is symptomatic with atrial fibrillation. Unless he had a posttermination pause, atrial fibrillation would not explain his most recent episode of syncope.  CHADS2-VASc=2 (HTN, age 19).  He will likely require long-term anticoagulation. We had a long discussion today regarding further  treatment of atrial fibrillation. Options include proceeding with elective cardioversion after 3-4 weeks of uninterrupted anticoagulation versus proceeding with TEE guided cardioversion. At this point, I have encouraged him to proceed with cardioversion after 3-4 weeks of uninterrupted anticoagulation. If his symptoms become unbearable, we can certainly consider TEE guided cardioversion sooner. Recent TSH was normal.  -  Continue Eliquis 5 mg twice a day and carvedilol 6.25 mg twice a day  -  Arrange echocardiogram  -  Follow-up in 3 weeks.  Proceed with DCCV if he remains in A. fib.   -  Proceed with TEE-DCCV is symptoms worsen  2. Syncope - His constellation of symptoms surrounding his syncope suggest post micturition syncope. I suspect he was dehydrated from working outside all day and drinking several beers prior to this occurring. He had also started on a new blood pressure medicine (Losartan) and had noted much lower blood pressures than normal. He has since gone back to his lisinopril.  At this point, I am not certain that it is necessary to restrict his driving as it seems that his syncope was vasovagal. I reviewed this with Dr. Curt Bears (DOD) who agreed. However, I have asked him to not drive until we can obtain the results of his echocardiogram. If his LV function is normal, he will be released to return to normal driving.  -  No driving for now. Ok to resume if EF normal on Echo.  3. HTN - Borderline control.   Continue to monitor.  4. Tobacco abuse - He is trying to quit.  Total time spent with patient today 45 minutes. This includes reviewing records, evaluating the patient and coordinating care. Face-to-face time >50%.   Medication Adjustments/Labs and Tests Ordered: Current medicines are reviewed at length with the patient today.  Concerns regarding medicines are outlined above.  Medication changes, Labs and Tests ordered today are outlined in the Patient Instructions noted below. Patient Instructions  Medication Instructions: Your physician recommends that you continue on your current medications as directed. Please refer to the Current Medication list given to you today. Labwork: NONE Procedures/Testing: Your physician has requested that you have an echocardiogram. Echocardiography is a painless test that uses sound waves to create images of your heart. It provides your doctor with information about the size and shape of your heart and how well your heart's chambers and valves are working. This procedure takes approximately one hour. There are no restrictions for this procedure. Echo to be done before 09/07/15, pt to see Richardson Dopp between 8/23 and 8/25 Follow-Up: Your physician recommends that you schedule a follow-up appointment with Richardson Dopp, PA between 09/07/15 and 09/09/15 Any Additional Special Instructions Will Be Listed Below (If Applicable). Rayne Du DRIVING UNTIL FURTHER ADVISED BY CARDIOLOGY^^ If you need a refill on your cardiac medications before your next appointment, please call your pharmacy.   Signed, Richardson Dopp, PA-C  08/22/2015 5:51 PM    [facility] [place], [place], [place]  [address] Phone: [phone]; Fax: [phone]

## 2015-08-22 ENCOUNTER — Encounter: Payer: Self-pay | Admitting: Physician Assistant

## 2015-08-22 ENCOUNTER — Ambulatory Visit (INDEPENDENT_AMBULATORY_CARE_PROVIDER_SITE_OTHER): Payer: Medicare Other | Admitting: Physician Assistant

## 2015-08-22 ENCOUNTER — Encounter (INDEPENDENT_AMBULATORY_CARE_PROVIDER_SITE_OTHER): Payer: Self-pay

## 2015-08-22 VITALS — BP 130/90 | HR 76 | Ht 72.0 in | Wt 161.0 lb

## 2015-08-22 DIAGNOSIS — R55 Syncope and collapse: Secondary | ICD-10-CM

## 2015-08-22 DIAGNOSIS — I10 Essential (primary) hypertension: Secondary | ICD-10-CM | POA: Diagnosis not present

## 2015-08-22 DIAGNOSIS — Z720 Tobacco use: Secondary | ICD-10-CM | POA: Diagnosis not present

## 2015-08-22 DIAGNOSIS — I480 Paroxysmal atrial fibrillation: Secondary | ICD-10-CM

## 2015-08-22 NOTE — Patient Instructions (Addendum)
Medication Instructions: Your physician recommends that you continue on your current medications as directed. Please refer to the Current Medication list given to you today. Labwork: NONE Procedures/Testing: Your physician has requested that you have an echocardiogram. Echocardiography is a painless test that uses sound waves to create images of your heart. It provides your doctor with information about the size and shape of your heart and how well your heart's chambers and valves are working. This procedure takes approximately one hour. There are no restrictions for this procedure. Echo to be done before 09/07/15, pt to see Kevin Leon between 8/23 and 8/25 Follow-Up: Your physician recommends that you schedule a follow-up appointment with Kevin Dopp, PA between 09/07/15 and 09/09/15 Any Additional Special Instructions Will Be Listed Below (If Applicable). Rayne Du DRIVING UNTIL FURTHER ADVISED BY CARDIOLOGY^^ If you need a refill on your cardiac medications before your next appointment, please call your pharmacy.

## 2015-08-25 ENCOUNTER — Encounter: Payer: Self-pay | Admitting: Physician Assistant

## 2015-08-26 ENCOUNTER — Other Ambulatory Visit: Payer: Medicare Other

## 2015-08-30 ENCOUNTER — Ambulatory Visit (HOSPITAL_COMMUNITY): Payer: Medicare Other | Attending: Cardiovascular Disease

## 2015-08-30 ENCOUNTER — Other Ambulatory Visit: Payer: Self-pay

## 2015-08-30 DIAGNOSIS — I10 Essential (primary) hypertension: Secondary | ICD-10-CM | POA: Insufficient documentation

## 2015-08-30 DIAGNOSIS — Z720 Tobacco use: Secondary | ICD-10-CM | POA: Diagnosis not present

## 2015-08-30 DIAGNOSIS — I340 Nonrheumatic mitral (valve) insufficiency: Secondary | ICD-10-CM | POA: Insufficient documentation

## 2015-08-30 DIAGNOSIS — I480 Paroxysmal atrial fibrillation: Secondary | ICD-10-CM | POA: Diagnosis not present

## 2015-08-30 DIAGNOSIS — I4891 Unspecified atrial fibrillation: Secondary | ICD-10-CM | POA: Diagnosis present

## 2015-09-01 ENCOUNTER — Telehealth: Payer: Self-pay | Admitting: *Deleted

## 2015-09-01 NOTE — Telephone Encounter (Signed)
Pt and his wife have been notified of echo results by phone with verbal understanding. Pt asked what can we do about his a-fib. I did advised pt we can medications to help control a-fib , there are procedures that can be done. I did advise pt though the least invasive is usually the first plan of care. Pt agreeable to plan of care and to keep appt with Scott W. PA 09/09/15.

## 2015-09-08 NOTE — Progress Notes (Signed)
Cardiology Office Note:    Date:  09/09/2015   ID:  Kevin Leon, DOB [birth year], MRN [id number]  PCP:  Vicenta Aly, FNP  Cardiologist:  Dr. Liam Rogers   Electrophysiologist:  n/a  Referring MD: Vicenta Aly, [place]   Chief Complaint  Patient presents with  . Atrial Fibrillation    Follow Up    History of Present Illness:    Kevin Leon is a 65 y.o. male with a hx of PAF, HTN, hepatitis C, tobacco abuse. Stress echo in 2010 with normal echo images.    I saw him 08/22/15 after evaluation by primary care for syncope. He was noted to be in atrial fibrillation which was new. TSH was normal. Heart rate was well controlled. On the date that he passed out, he had been outside in extreme heat mowing the yard. He had 5-6 beers earlier that day. Syncope occurred after urination. I placed him on Eliquis for anticoagulation (CHADS2-VASc=2).  Echocardiogram was obtained. This demonstrated normal LV function. He was somewhat symptomatic with atrial fibrillation. Therefore, strategy of rhythm control was chosen. He returns today with an eye towards DCCV should he remain in atrial fibrillation.   Here with his wife. He continues to feel dizzy on occasion.  He denies recurrent syncope.  He denies chest pain, dyspnea, edema, bleeding issues. He is tolerating his medications well.  He plans to quit smoking 09/16/15.   Prior CV studies that were reviewed today include:    Echo 08/30/15 EF 60-65%, normal wall motion, mild MR   Past Medical History:  Diagnosis Date  . Anxiety   . Chest pain   . Hepatitis C   . HTN (hypertension)   . Right rotator cuff tear 10/08/2014    Past Surgical History:  Procedure Laterality Date  . large piece of wood removed from buttocks     as child  . SHOULDER ARTHROSCOPY WITH ROTATOR CUFF REPAIR Right 10/08/2014   Procedure: RIGHT SHOULDER ARTHROSCOPY, DEBRIDEMENT, ACROMIOPLASTY WITH ROTATOR CUFF REPAIR;  Surgeon: Marchia Bond, MD;  Location: [facility];  Service: Orthopedics;  Laterality: Right;  . TONSILLECTOMY      Current Medications: Outpatient Medications Prior to Visit  Medication Sig Dispense Refill  . carvedilol (COREG) 6.25 MG tablet TAKE 1 TABLET (6.25 MG TOTAL) BY MOUTH 2 (TWO) TIMES DAILY WITH A MEAL. 180 tablet 3  . lisinopril (PRINIVIL,ZESTRIL) 10 MG tablet Take 1 tablet (10 mg total) by mouth daily. 90 tablet 3  . LORazepam (ATIVAN) 0.5 MG tablet Take 0.5 mg by mouth every 6 (six) hours as needed for anxiety.     Marland Kitchen apixaban (ELIQUIS) 5 MG TABS tablet Take 5 mg by mouth 2 (two) times daily.     No facility-administered medications prior to visit.       Allergies:   Review of patient's allergies indicates no known allergies.   Social History   Social History  . Marital status: Married    Spouse name: [name]  . Number of children: [name]  . Years of education: [name]   Social History Main Topics  . Smoking status: Current Every Day Smoker    Packs/day: 0.50    Types: Cigarettes    Last attempt to quit: 02/15/2014  . Smokeless tobacco: Never Used  . Alcohol use 0.0 oz/week     Comment:  2 beers daily  . 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 Sexual activity: Yes  Other Topics Concern  . None   Social History Narrative  . None     Family History:  The patient's family history includes Hypertension in his mother.   ROS:   Please see the history of present illness.    Review of Systems  Cardiovascular: Positive for irregular heartbeat.  Neurological: Positive for dizziness.   All other systems reviewed and are negative.   EKGs/Labs/Other Test Reviewed:    EKG:  EKG is  ordered today.  The ekg ordered today demonstrates AFib, HR 65, normal axis  Recent Labs: No results found for requested labs within last 8760 hours.   Recent Lipid Panel    Component Value Date/Time   CHOL 151 04/03/2013 0840   TRIG 19.0 04/03/2013 0840   HDL 84.50 04/03/2013 0840   CHOLHDL 2  04/03/2013 0840   VLDL 3.8 04/03/2013 0840   LDLCALC 63 04/03/2013 0840     Physical Exam:    VS:  BP 130/70   Pulse 86   Ht 6' (1.829 m)   Wt 161 lb 6.4 oz (73.2 kg)   SpO2 99%   BMI 21.89 kg/m     Wt Readings from Last 3 Encounters:  09/09/15 161 lb 6.4 oz (73.2 kg)  08/22/15 161 lb (73 kg)  07/22/15 160 lb 6.4 oz (72.8 kg)     Physical Exam  Constitutional: He is oriented to person, place, and time. He appears well-developed and well-nourished. No distress.  HENT:  Head: Normocephalic and atraumatic.  Eyes: No scleral icterus.  Neck: Normal range of motion. No JVD present.  Cardiovascular: Normal rate, S1 normal and S2 normal.  An irregularly irregular rhythm present. Exam reveals no gallop and no friction rub.   No murmur heard. Pulmonary/Chest: Effort normal and breath sounds normal. He has no wheezes. He has no rhonchi. He has no rales.  Abdominal: Soft. There is no tenderness.  Musculoskeletal: He exhibits no edema.  Neurological: He is alert and oriented to person, place, and time.  Skin: Skin is warm and dry.  Psychiatric: He has a normal mood and affect.    ASSESSMENT:    1. Persistent atrial fibrillation (HCC)   2. Vasovagal syncope   3. Essential hypertension   4. Tobacco abuse    PLAN:    In order of problems listed above:  1. Persistent AF -  He has been symptomatic with atrial fibrillation.  CHADS2-VASc=2 (HTN, age 47).  He is currently on Eliquis 5 mg twice a day and carvedilol 6.25 mg twice a day.  Echo demonstrated normal LVEF.  Bilateral atria are normal size.  He should be a good candidate to restore NSR with DCCV.  Risks and benefits were discussed today.  I reviewed the plan with Dr. Acie Fredrickson by phone today who agreed with proceeding with DCCV.  He has been on uninterrupted anticoagulation since 08/17/15.  -  Continue Eliquis.  -  Arrange DCCV next week with Dr. Acie Fredrickson  -  Continue Coreg.  -  FU 2 weeks after DCCV.  If recurrent, symptomatic  AFib, consider Flecainide.  2. Syncope - His constellation of symptoms surrounding his syncope suggested post micturition syncope. It seemed that his syncope was vasovagal.  As noted, EF was normal on Echo.  No recurrent syncope.   3. HTN - Controlled.   4. Tobacco abuse - He plans to quit 09/16/15.   Medication Adjustments/Labs and Tests Ordered: Current medicines are reviewed at length with the patient today.  Concerns regarding medicines  are outlined above.  Medication changes, Labs and Tests ordered today are outlined in the Patient Instructions noted below. Patient Instructions  Medication Instructions:  CONTINUE ON ELIQUIS 5 MG TWICE DAILY Do not miss any doses of Eliquis.  If you have missed or do miss a dose, let us know right away.  Labwork: TODAY BMET, CBC W/DIFF  Testing/Procedures: Your physician has recommended that you have a Cardioversion (DCCV). Electrical Cardioversion uses a jolt of electricity to your heart either through paddles or wired patches attached to your chest. This is a controlled, usually prescheduled, procedure. Defibrillation is done under light anesthesia in the hospital, and you usually go home the day of the procedure. This is done to get your heart back into a normal rhythm. You are not awake for the procedure. Please see the instruction sheet given to you today.  Follow-Up: 10/05/15 @ 8:15 WITH [place], PAC.Marland KitchenPOST CARDIOVERSION FOLLOW UP   Any Other Special Instructions Will Be Listed Below (If Applicable). If you need a refill on your cardiac medications before your next appointment, please call your pharmacy.  Signed, Richardson Dopp, PA-C  09/09/2015 9:43 AM    [facility] [place], [place], [place]  [address] Phone: [phone]; Fax: [phone]   Attending Note:   The patient was seen and examined.  Agree with assessment and plan as noted above.  Changes made to the above note as needed.  Patient seen  and independently examined with Richardson Dopp, PA.   We discussed all aspects of the encounter. I agree with the assessment and plan as stated above.  Richardson Landry has had atrial fib. Now is scheduled for cardioversion    I have spent a total of 40 minutes with patient reviewing hospital  notes , telemetry, EKGs, labs and examining patient as well as establishing an assessment and plan that was discussed with the patient. > 50% of time was spent in direct patient care.    Thayer Headings, Brooke Bonito., MD, [facility] 09/13/2015, 5:59 PM [address]  [place] Pager [phone]

## 2015-09-09 ENCOUNTER — Encounter: Payer: Self-pay | Admitting: *Deleted

## 2015-09-09 ENCOUNTER — Ambulatory Visit (INDEPENDENT_AMBULATORY_CARE_PROVIDER_SITE_OTHER): Payer: Medicare Other | Admitting: Physician Assistant

## 2015-09-09 ENCOUNTER — Encounter: Payer: Self-pay | Admitting: Physician Assistant

## 2015-09-09 VITALS — BP 130/70 | HR 86 | Ht 72.0 in | Wt 161.4 lb

## 2015-09-09 DIAGNOSIS — Z720 Tobacco use: Secondary | ICD-10-CM | POA: Diagnosis not present

## 2015-09-09 DIAGNOSIS — I481 Persistent atrial fibrillation: Secondary | ICD-10-CM

## 2015-09-09 DIAGNOSIS — I4819 Other persistent atrial fibrillation: Secondary | ICD-10-CM

## 2015-09-09 DIAGNOSIS — I480 Paroxysmal atrial fibrillation: Secondary | ICD-10-CM | POA: Insufficient documentation

## 2015-09-09 DIAGNOSIS — I10 Essential (primary) hypertension: Secondary | ICD-10-CM

## 2015-09-09 DIAGNOSIS — R55 Syncope and collapse: Secondary | ICD-10-CM

## 2015-09-09 HISTORY — DX: Other persistent atrial fibrillation: I48.19

## 2015-09-09 LAB — BASIC METABOLIC PANEL
BUN: 9 mg/dL (ref 7–25)
CHLORIDE: 98 mmol/L (ref 98–110)
CO2: 28 mmol/L (ref 20–31)
Calcium: 9.1 mg/dL (ref 8.6–10.3)
Creat: 0.86 mg/dL (ref 0.70–1.25)
Glucose, Bld: 82 mg/dL (ref 65–99)
POTASSIUM: 4.8 mmol/L (ref 3.5–5.3)
Sodium: 132 mmol/L — ABNORMAL LOW (ref 135–146)

## 2015-09-09 LAB — CBC WITH DIFFERENTIAL/PLATELET
BASOS PCT: 1 %
Basophils Absolute: 53 cells/uL (ref 0–200)
EOS ABS: 106 {cells}/uL (ref 15–500)
Eosinophils Relative: 2 %
HEMATOCRIT: 42.6 % (ref 38.5–50.0)
Hemoglobin: 14.9 g/dL (ref 13.2–17.1)
LYMPHS PCT: 37 %
Lymphs Abs: 1961 cells/uL (ref 850–3900)
MCH: 33.1 pg — ABNORMAL HIGH (ref 27.0–33.0)
MCHC: 35 g/dL (ref 32.0–36.0)
MCV: 94.7 fL (ref 80.0–100.0)
MONO ABS: 901 {cells}/uL (ref 200–950)
MPV: 10 fL (ref 7.5–12.5)
Monocytes Relative: 17 %
NEUTROS ABS: 2279 {cells}/uL (ref 1500–7800)
Neutrophils Relative %: 43 %
Platelets: 228 10*3/uL (ref 140–400)
RBC: 4.5 MIL/uL (ref 4.20–5.80)
RDW: 12.9 % (ref 11.0–15.0)
WBC: 5.3 10*3/uL (ref 3.8–10.8)

## 2015-09-09 MED ORDER — APIXABAN 5 MG PO TABS: 5.0000 mg | ORAL_TABLET | Freq: Two times a day (BID) | ORAL | 3 refills | Status: DC

## 2015-09-09 NOTE — Patient Instructions (Addendum)
Medication Instructions:  CONTINUE ON ELIQUIS 5 MG TWICE DAILY Do not miss any doses of Eliquis.  If you have missed or do miss a dose, let us know right away.  Labwork: TODAY BMET, CBC W/DIFF  Testing/Procedures: Your physician has recommended that you have a Cardioversion (DCCV). Electrical Cardioversion uses a jolt of electricity to your heart either through paddles or wired patches attached to your chest. This is a controlled, usually prescheduled, procedure. Defibrillation is done under light anesthesia in the hospital, and you usually go home the day of the procedure. This is done to get your heart back into a normal rhythm. You are not awake for the procedure. Please see the instruction sheet given to you today.  Follow-Up: 10/05/15 @ 8:15 WITH [place], PAC.Marland KitchenPOST CARDIOVERSION FOLLOW UP   Any Other Special Instructions Will Be Listed Below (If Applicable). If you need a refill on your cardiac medications before your next appointment, please call your pharmacy.

## 2015-09-12 ENCOUNTER — Telehealth: Payer: Self-pay | Admitting: Physician Assistant

## 2015-09-12 ENCOUNTER — Telehealth: Payer: Self-pay | Admitting: *Deleted

## 2015-09-12 NOTE — Telephone Encounter (Signed)
I s/w dentist today Dr. Raelyn Ensign to advise pt pending DCCV and cannot stop Eliquis. Dr. Raelyn Ensign states just doing fillings today. Dr. Raelyn Ensign explained to pt ok to do fillings.

## 2015-09-12 NOTE — Telephone Encounter (Signed)
New Message  Request for surgical clearance:  1. What type of surgery is being performed? Fillings   2. When is this surgery scheduled? Today/This Morning  3. Are there any medications that need to be held prior to surgery and how long? Eliquis  4. Name of physician performing surgery? Dr. Elwanda [place]   5. What is your office phone and fax number? [phone]-Phone  Pt voiced while being on Eliquis he's not suppose to have any dental procedures without notifying provider.

## 2015-09-12 NOTE — Telephone Encounter (Signed)
Lmom lab work ok. If any questions call [phone]. Otherwise continue current Tx plan.

## 2015-09-14 ENCOUNTER — Ambulatory Visit (HOSPITAL_COMMUNITY): Payer: Medicare Other | Admitting: Certified Registered"

## 2015-09-14 ENCOUNTER — Encounter (HOSPITAL_COMMUNITY)
Admission: RE | Disposition: A | Discharge status: Home / Self Care | Payer: Self-pay | Source: Ambulatory Visit | Attending: Cardiovascular Disease

## 2015-09-14 ENCOUNTER — Ambulatory Visit (HOSPITAL_COMMUNITY)
Admission: RE | Admit: 2015-09-14 | Discharge: 2015-09-14 | Disposition: A | Discharge status: Home / Self Care | Payer: Medicare Other | Source: Ambulatory Visit | Attending: Cardiovascular Disease | Admitting: Cardiovascular Disease

## 2015-09-14 ENCOUNTER — Encounter (HOSPITAL_COMMUNITY): Payer: Self-pay | Admitting: *Deleted

## 2015-09-14 DIAGNOSIS — I481 Persistent atrial fibrillation: Secondary | ICD-10-CM | POA: Insufficient documentation

## 2015-09-14 DIAGNOSIS — I4891 Unspecified atrial fibrillation: Secondary | ICD-10-CM | POA: Diagnosis not present

## 2015-09-14 DIAGNOSIS — I10 Essential (primary) hypertension: Secondary | ICD-10-CM | POA: Diagnosis not present

## 2015-09-14 DIAGNOSIS — I4819 Other persistent atrial fibrillation: Secondary | ICD-10-CM

## 2015-09-14 DIAGNOSIS — F17210 Nicotine dependence, cigarettes, uncomplicated: Secondary | ICD-10-CM | POA: Insufficient documentation

## 2015-09-14 DIAGNOSIS — Z79899 Other long term (current) drug therapy: Secondary | ICD-10-CM | POA: Diagnosis not present

## 2015-09-14 DIAGNOSIS — F419 Anxiety disorder, unspecified: Secondary | ICD-10-CM | POA: Insufficient documentation

## 2015-09-14 DIAGNOSIS — Z7901 Long term (current) use of anticoagulants: Secondary | ICD-10-CM | POA: Insufficient documentation

## 2015-09-14 DIAGNOSIS — I480 Paroxysmal atrial fibrillation: Secondary | ICD-10-CM | POA: Diagnosis not present

## 2015-09-14 HISTORY — PX: CARDIOVERSION: SHX1299

## 2015-09-14 SURGERY — CARDIOVERSION
Anesthesia: Monitor Anesthesia Care

## 2015-09-14 MED ORDER — HYDROCORTISONE 1 % EX CREA
1.0000 "application " | TOPICAL_CREAM | Freq: Three times a day (TID) | CUTANEOUS | Status: DC | PRN
  Filled 2015-09-14: qty 28

## 2015-09-14 MED ORDER — PROPOFOL 10 MG/ML IV BOLUS
INTRAVENOUS | Status: DC | PRN
  Administered 2015-09-14: 70 mg via INTRAVENOUS

## 2015-09-14 MED ORDER — SODIUM CHLORIDE 0.9% FLUSH: 3.0000 mL | INTRAVENOUS | Status: DC | PRN

## 2015-09-14 MED ORDER — SODIUM CHLORIDE 0.9 % IV SOLN
250.0000 mL | INTRAVENOUS | Status: DC
  Administered 2015-09-14: 500 mL via INTRAVENOUS

## 2015-09-14 MED ORDER — LIDOCAINE HCL (CARDIAC) 20 MG/ML IV SOLN
INTRAVENOUS | Status: DC | PRN
  Administered 2015-09-14: 60 mg via INTRATRACHEAL

## 2015-09-14 MED ORDER — SODIUM CHLORIDE 0.9% FLUSH: 3.0000 mL | Freq: Two times a day (BID) | INTRAVENOUS | Status: DC

## 2015-09-14 NOTE — H&P (View-Only) (Signed)
Cardiology Office Note:    Date:  09/09/2015   ID:  Kevin Leon, DOB 05/13/1950, MRN [id number]  PCP:  Vicenta Aly, FNP  Cardiologist:  Dr. Liam Rogers   Electrophysiologist:  n/a  Referring MD: Vicenta Aly, [place]   Chief Complaint  Patient presents with  . Atrial Fibrillation    Follow Up    History of Present Illness:    Kevin Leon is a 65 y.o. male with a hx of PAF, HTN, hepatitis C, tobacco abuse. Stress echo in 2010 with normal echo images.    I saw him 08/22/15 after evaluation by primary care for syncope. He was noted to be in atrial fibrillation which was new. TSH was normal. Heart rate was well controlled. On the date that he passed out, he had been outside in extreme heat mowing the yard. He had 5-6 beers earlier that day. Syncope occurred after urination. I placed him on Eliquis for anticoagulation (CHADS2-VASc=2).  Echocardiogram was obtained. This demonstrated normal LV function. He was somewhat symptomatic with atrial fibrillation. Therefore, strategy of rhythm control was chosen. He returns today with an eye towards DCCV should he remain in atrial fibrillation.   Here with his wife. He continues to feel dizzy on occasion.  He denies recurrent syncope.  He denies chest pain, dyspnea, edema, bleeding issues. He is tolerating his medications well.  He plans to quit smoking 09/16/15.   Prior CV studies that were reviewed today include:    Echo 08/30/15 EF 60-65%, normal wall motion, mild MR   Past Medical History:  Diagnosis Date  . Anxiety   . Chest pain   . Hepatitis C   . HTN (hypertension)   . Right rotator cuff tear 10/08/2014    Past Surgical History:  Procedure Laterality Date  . large piece of wood removed from buttocks     as child  . SHOULDER ARTHROSCOPY WITH ROTATOR CUFF REPAIR Right 10/08/2014   Procedure: RIGHT SHOULDER ARTHROSCOPY, DEBRIDEMENT, ACROMIOPLASTY WITH ROTATOR CUFF REPAIR;  Surgeon: Marchia Bond, MD;  Location: [facility];  Service: Orthopedics;  Laterality: Right;  . TONSILLECTOMY      Current Medications: Outpatient Medications Prior to Visit  Medication Sig Dispense Refill  . carvedilol (COREG) 6.25 MG tablet TAKE 1 TABLET (6.25 MG TOTAL) BY MOUTH 2 (TWO) TIMES DAILY WITH A MEAL. 180 tablet 3  . lisinopril (PRINIVIL,ZESTRIL) 10 MG tablet Take 1 tablet (10 mg total) by mouth daily. 90 tablet 3  . LORazepam (ATIVAN) 0.5 MG tablet Take 0.5 mg by mouth every 6 (six) hours as needed for anxiety.     Marland Kitchen apixaban (ELIQUIS) 5 MG TABS tablet Take 5 mg by mouth 2 (two) times daily.     No facility-administered medications prior to visit.       Allergies:   Review of patient's allergies indicates no known allergies.   Social History   Social History  . Marital status: Married    Spouse name: [name]  . Number of children: [name]  . Years of education: [name]   Social History Main Topics  . Smoking status: Current Every Day Smoker    Packs/day: 0.50    Types: Cigarettes    Last attempt to quit: 02/15/2014  . Smokeless tobacco: Never Used  . Alcohol use 0.0 oz/week     Comment:  2 beers daily  . 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 Sexual activity: Yes  Other Topics Concern  . None   Social History Narrative  . None     Family History:  The patient's family history includes Hypertension in his mother.   ROS:   Please see the history of present illness.    Review of Systems  Cardiovascular: Positive for irregular heartbeat.  Neurological: Positive for dizziness.   All other systems reviewed and are negative.   EKGs/Labs/Other Test Reviewed:    EKG:  EKG is  ordered today.  The ekg ordered today demonstrates AFib, HR 65, normal axis  Recent Labs: No results found for requested labs within last 8760 hours.   Recent Lipid Panel    Component Value Date/Time   CHOL 151 04/03/2013 0840   TRIG 19.0 04/03/2013 0840   HDL 84.50 04/03/2013 0840   CHOLHDL 2  04/03/2013 0840   VLDL 3.8 04/03/2013 0840   LDLCALC 63 04/03/2013 0840     Physical Exam:    VS:  BP 130/70   Pulse 86   Ht 6' (1.829 m)   Wt 161 lb 6.4 oz (73.2 kg)   SpO2 99%   BMI 21.89 kg/m     Wt Readings from Last 3 Encounters:  09/09/15 161 lb 6.4 oz (73.2 kg)  08/22/15 161 lb (73 kg)  07/22/15 160 lb 6.4 oz (72.8 kg)     Physical Exam  Constitutional: He is oriented to person, place, and time. He appears well-developed and well-nourished. No distress.  HENT:  Head: Normocephalic and atraumatic.  Eyes: No scleral icterus.  Neck: Normal range of motion. No JVD present.  Cardiovascular: Normal rate, S1 normal and S2 normal.  An irregularly irregular rhythm present. Exam reveals no gallop and no friction rub.   No murmur heard. Pulmonary/Chest: Effort normal and breath sounds normal. He has no wheezes. He has no rhonchi. He has no rales.  Abdominal: Soft. There is no tenderness.  Musculoskeletal: He exhibits no edema.  Neurological: He is alert and oriented to person, place, and time.  Skin: Skin is warm and dry.  Psychiatric: He has a normal mood and affect.    ASSESSMENT:    1. Persistent atrial fibrillation (HCC)   2. Vasovagal syncope   3. Essential hypertension   4. Tobacco abuse    PLAN:    In order of problems listed above:  1. Persistent AF -  He has been symptomatic with atrial fibrillation.  CHADS2-VASc=2 (HTN, age 67).  He is currently on Eliquis 5 mg twice a day and carvedilol 6.25 mg twice a day.  Echo demonstrated normal LVEF.  Bilateral atria are normal size.  He should be a good candidate to restore NSR with DCCV.  Risks and benefits were discussed today.  I reviewed the plan with Dr. Acie Fredrickson by phone today who agreed with proceeding with DCCV.  He has been on uninterrupted anticoagulation since 08/17/15.  -  Continue Eliquis.  -  Arrange DCCV next week with Dr. Acie Fredrickson  -  Continue Coreg.  -  FU 2 weeks after DCCV.  If recurrent, symptomatic  AFib, consider Flecainide.  2. Syncope - His constellation of symptoms surrounding his syncope suggested post micturition syncope. It seemed that his syncope was vasovagal.  As noted, EF was normal on Echo.  No recurrent syncope.   3. HTN - Controlled.   4. Tobacco abuse - He plans to quit 09/16/15.   Medication Adjustments/Labs and Tests Ordered: Current medicines are reviewed at length with the patient today.  Concerns regarding medicines  are outlined above.  Medication changes, Labs and Tests ordered today are outlined in the Patient Instructions noted below. Patient Instructions  Medication Instructions:  CONTINUE ON ELIQUIS 5 MG TWICE DAILY Do not miss any doses of Eliquis.  If you have missed or do miss a dose, let us know right away.  Labwork: TODAY BMET, CBC W/DIFF  Testing/Procedures: Your physician has recommended that you have a Cardioversion (DCCV). Electrical Cardioversion uses a jolt of electricity to your heart either through paddles or wired patches attached to your chest. This is a controlled, usually prescheduled, procedure. Defibrillation is done under light anesthesia in the hospital, and you usually go home the day of the procedure. This is done to get your heart back into a normal rhythm. You are not awake for the procedure. Please see the instruction sheet given to you today.  Follow-Up: 10/05/15 @ 8:15 WITH [place], PAC.Marland KitchenPOST CARDIOVERSION FOLLOW UP   Any Other Special Instructions Will Be Listed Below (If Applicable). If you need a refill on your cardiac medications before your next appointment, please call your pharmacy.  Signed, Kevin Dopp, PA-C  09/09/2015 9:43 AM    [facility] [place], [place], [place]  [address] Phone: [phone]; Fax: [phone]   Attending Note:   The patient was seen and examined.  Agree with assessment and plan as noted above.  Changes made to the above note as needed.  Patient seen  and independently examined with Kevin Dopp, PA.   We discussed all aspects of the encounter. I agree with the assessment and plan as stated above.  Kevin Leon has had atrial fib. Now is scheduled for cardioversion    I have spent a total of 40 minutes with patient reviewing hospital  notes , telemetry, EKGs, labs and examining patient as well as establishing an assessment and plan that was discussed with the patient. > 50% of time was spent in direct patient care.    [place] Headings, Brooke Bonito., MD, [facility] 09/13/2015, 5:59 PM [address]  [place] Pager [phone]

## 2015-09-14 NOTE — Discharge Instructions (Signed)
Electrical Cardioversion, Care After °Refer to this sheet in the next few weeks. These instructions provide you with information on caring for yourself after your procedure. Your health care provider may also give you more specific instructions. Your treatment has been planned according to current medical practices, but problems sometimes occur. Call your health care provider if you have any problems or questions after your procedure. °WHAT TO EXPECT AFTER THE PROCEDURE °After your procedure, it is typical to have the following sensations: °· Some redness on the skin where the shocks were delivered. If this is tender, a sunburn lotion or hydrocortisone cream may help. °· Possible return of an abnormal heart rhythm within hours or days after the procedure. °HOME CARE INSTRUCTIONS °· Take medicines only as directed by your health care provider. Be sure you understand how and when to take your medicine. °· Learn how to feel your pulse and check it often. °· Limit your activity for 48 hours after the procedure or as directed by your health care provider. °· Avoid or minimize caffeine and other stimulants as directed by your health care provider. °SEEK MEDICAL CARE IF: °· You feel like your heart is beating too fast or your pulse is not regular. °· You have any questions about your medicines. °· You have bleeding that will not stop. °SEEK IMMEDIATE MEDICAL CARE IF: °· You are dizzy or feel faint. °· It is hard to breathe or you feel short of breath. °· There is a change in discomfort in your chest. °· Your speech is slurred or you have trouble moving an arm or leg on one side of your body. °· You get a serious muscle cramp that does not go away. °· Your fingers or toes turn cold or blue. °  °This information is not intended to replace advice given to you by your health care provider. Make sure you discuss any questions you have with your health care provider. °  °Document Released: 10/22/2012 Document Revised: 01/22/2014  Document Reviewed: 10/22/2012 °Elsevier Interactive Patient Education ©2016 [employer] Inc[employer] ° °

## 2015-09-14 NOTE — Transfer of Care (Signed)
Immediate Anesthesia Transfer of Care Note  Patient: Kevin Leon  Procedure(s) Performed: Procedure(s): CARDIOVERSION ([name])  Patient Location: PACU  Anesthesia Type:MAC  Level of Consciousness: patient cooperative and responds to stimulation  Airway & Oxygen Therapy: Patient Spontanous Breathing and Patient connected to nasal cannula oxygen  Post-op Assessment: Report given to RN and Post -op Vital signs reviewed and stable  Post vital signs: Reviewed and stable  Last Vitals:  Vitals:   09/14/15 1241  BP: (!) 181/95  Pulse: 90  Resp: 17  Temp: 36.7 C    Last Pain:  Vitals:   09/14/15 1241  TempSrc: Oral         Complications: No apparent anesthesia complications

## 2015-09-14 NOTE — Anesthesia Preprocedure Evaluation (Signed)
Anesthesia Evaluation  Patient identified by MRN, date of birth, ID band Patient awake    Reviewed: Allergy & Precautions, NPO status , Patient's Chart, lab work & pertinent test results  Airway Mallampati: II  TM Distance: >3 FB Neck ROM: Full    Dental no notable dental hx.    Pulmonary Current Smoker,    Pulmonary exam normal breath sounds clear to auscultation       Cardiovascular hypertension, negative cardio ROS Normal cardiovascular exam+ dysrhythmias Atrial Fibrillation  Rhythm:Regular Rate:Normal     Neuro/Psych negative neurological ROS  negative psych ROS   GI/Hepatic negative GI ROS, (+) Hepatitis -, C  Endo/Other  negative endocrine ROS  Renal/GU negative Renal ROS  negative genitourinary   Musculoskeletal negative musculoskeletal ROS (+)   Abdominal   Peds negative pediatric ROS (+)  Hematology negative hematology ROS (+)   Anesthesia Other Findings   Reproductive/Obstetrics negative OB ROS                             Anesthesia Physical Anesthesia Plan  ASA: III  Anesthesia Plan: MAC   Post-op Pain Management:    Induction: Intravenous  Airway Management Planned: Mask  Additional Equipment:   Intra-op Plan:   Post-operative Plan:   Informed Consent: I have reviewed the patients History and Physical, chart, labs and discussed the procedure including the risks, benefits and alternatives for the proposed anesthesia with the patient or authorized representative who has indicated his/her understanding and acceptance.   Dental advisory given  Plan Discussed with: CRNA and Surgeon  Anesthesia Plan Comments:         Anesthesia Quick Evaluation

## 2015-09-14 NOTE — CV Procedure (Signed)
    Cardioversion Note  ION WIESNER [id number] [birth year]  Procedure: DC Cardioversion Indications: atrial fib   Procedure DetailsPerformed Consent: Obtained Time Out: Verified patient identification, verified procedure, site/side was marked, verified correct patient position, special equipment/implants available, Radiology Safety Procedures followed,  medications/allergies/relevent history reviewed, required imaging and test results available.    The patient has been on adequate anticoagulation.  The patient received Lidocaine 60 mg IV followed by  Propofol 70 IV   for sedation.  Synchronous cardioversion was performed at 120  joules.  The cardioversion was successful     Complications: No apparent complications Patient did tolerate procedure well.   [place] Headings, Brooke Bonito., MD, [facility] 09/14/2015, 1:50 PM

## 2015-09-14 NOTE — Anesthesia Postprocedure Evaluation (Signed)
Anesthesia Post Note  Patient: Kevin Leon  Procedure(s) Performed: Procedure(s) (LRB): CARDIOVERSION ([name])  Patient location during evaluation: PACU Anesthesia Type: MAC Level of consciousness: awake and alert Pain management: pain level controlled Vital Signs Assessment: post-procedure vital signs reviewed and stable Respiratory status: spontaneous breathing, nonlabored ventilation, respiratory function stable and patient connected to nasal cannula oxygen Cardiovascular status: stable and blood pressure returned to baseline Anesthetic complications: no    Last Vitals:  Vitals:   09/14/15 1400 09/14/15 1410  BP: (!) 151/82 (!) 149/80  Pulse: 77 75  Resp: (!) 23 (!) 21  Temp:      Last Pain:  Vitals:   09/14/15 1241  TempSrc: Oral                 [name] S

## 2015-09-14 NOTE — Interval H&P Note (Signed)
History and Physical Interval Note:  09/14/2015 11:04 AM  Kevin Leon  has presented today for surgery, with the diagnosis of AFIB  The various methods of treatment have been discussed with the patient and family. After consideration of risks, benefits and other options for treatment, the patient has consented to  Procedure(s): CARDIOVERSION ([name]) as a surgical intervention .  The patient's history has been reviewed, patient examined, no change in status, stable for surgery.  I have reviewed the patient's chart and labs.  Questions were answered to the patient's satisfaction.     Kevin Leon

## 2015-10-04 DIAGNOSIS — I10 Essential (primary) hypertension: Secondary | ICD-10-CM | POA: Insufficient documentation

## 2015-10-04 DIAGNOSIS — Z720 Tobacco use: Secondary | ICD-10-CM | POA: Insufficient documentation

## 2015-10-04 HISTORY — DX: Tobacco use: Z72.0

## 2015-10-04 HISTORY — DX: Essential (primary) hypertension: I10

## 2015-10-04 NOTE — Progress Notes (Signed)
Cardiology Office Note:    Date:  10/05/2015   ID:  Kevin Leon, DOB Mar 31, 1950, MRN [id number]  PCP:  Vicenta Aly, FNP  Cardiologist:  Dr. Liam Rogers   Electrophysiologist:  n/a  Referring MD: Vicenta Aly, [place]   Chief Complaint  Patient presents with  . Follow-up    AFib s/p DCCV    History of Present Illness:    Kevin Leon is a 65 y.o. male with a hx of PAF, HTN, hepatitis C, tobacco abuse. Stress echo in 2010 with normal echo images.  He was dx with AFib in 08/2015.  CHADS2-VASc=2.  He is on Apixaban for anticoagulation.  He is felt to be symptomatic with AFib and we chose a strategy of rhythm control.   After 3-4 weeks of uninterrupted anticoagulation, he underwent DCCV on 09/14/15 with restoration of NSR.  He returns for FU.    He is having chronic HAs.  He thinks it is from the Eliquis.  However, he notes that these HAs did not start until he underwent the DCCV (several weeks after starting Eliquis).  He also notes about 30 minutes of confusion last night.  He had trouble forming a complete sentence and could not turn on the lights in his room.  He feels fine this AM.  He denies syncope. He denies chest pain, dyspnea, orthopnea, PND, edema.  He denies any bleeding issues.  His BP is high today. But, he has been checking his BP at home and it has been [id number] systolic.  Prior CV studies that were reviewed today include:    Echo 08/30/15 EF 60-65%, normal wall motion, mild MR  Past Medical History:  Diagnosis Date  . Anxiety   . Hepatitis C   . History of stress test    ETT-Echo in 2010 was normal  . HTN (hypertension)   . Persistent atrial fibrillation ([place]) 09/09/2015   CHADS2-VASc=2 //  Apixaban //  s/p DCCV 09/14/15  . Right rotator cuff tear 10/08/2014    Past Surgical History:  Procedure Laterality Date  . CARDIOVERSION [name] 09/14/2015   Procedure: CARDIOVERSION;  Surgeon: [place] Headings, MD;  Location: [facility] ENDOSCOPY;  Service: Cardiovascular;   Laterality: [name];  . large piece of wood removed from buttocks     as child  . SHOULDER ARTHROSCOPY WITH ROTATOR CUFF REPAIR Right 10/08/2014   Procedure: RIGHT SHOULDER ARTHROSCOPY, DEBRIDEMENT, ACROMIOPLASTY WITH ROTATOR CUFF REPAIR;  Surgeon: Marchia Bond, MD;  Location: [facility];  Service: Orthopedics;  Laterality: Right;  . TONSILLECTOMY      Current Medications: Outpatient Medications Prior to Visit  Medication Sig Dispense Refill  . apixaban (ELIQUIS) 5 MG TABS tablet Take 1 tablet (5 mg total) by mouth 2 (two) times daily. 180 tablet 3  . carvedilol (COREG) 6.25 MG tablet TAKE 1 TABLET (6.25 MG TOTAL) BY MOUTH 2 (TWO) TIMES DAILY WITH A MEAL. 180 tablet 3  . lisinopril (PRINIVIL,ZESTRIL) 10 MG tablet Take 1 tablet (10 mg total) by mouth daily. 90 tablet 3  . LORazepam (ATIVAN) 0.5 MG tablet Take 0.5 mg by mouth every 6 (six) hours as needed for anxiety.      No facility-administered medications prior to visit.       Allergies:   Review of patient's allergies indicates no known allergies.   Social History   Social History  . Marital status: Married    Spouse name: [name]  . Number of children: [name]  . Years of education: [name]  Social History Main Topics  . Smoking status: Current Every Day Smoker    Packs/day: 0.50    Types: Cigarettes    Last attempt to quit: 02/15/2014  . Smokeless tobacco: Never Used  . Alcohol use 0.0 oz/week     Comment:  2 beers daily  . 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 Sexual activity: Yes   Other Topics Concern  . None   Social History Narrative  . None     Family History:  The patient's family history includes Hypertension in his mother.   ROS:   Please see the history of present illness.    Review of Systems  HENT: Positive for headaches.   Neurological: Positive for dizziness.  Psychiatric/Behavioral: The patient is nervous/anxious.    All other systems reviewed and are  negative.   EKGs/Labs/Other Test Reviewed:    EKG:  EKG is  ordered today.  The ekg ordered today demonstrates NSR, HR 65, normal axis, no acute ST changes, QTc 420 ms  Recent Labs: 09/09/2015: BUN 9; Creat 0.86; Hemoglobin 14.9; Platelets 228; Potassium 4.8; Sodium 132   Recent Lipid Panel    Component Value Date/Time   CHOL 151 04/03/2013 0840   TRIG 19.0 04/03/2013 0840   HDL 84.50 04/03/2013 0840   CHOLHDL 2 04/03/2013 0840   VLDL 3.8 04/03/2013 0840   LDLCALC 63 04/03/2013 0840     Physical Exam:    VS:  BP (!) 170/82   Pulse 65   Ht 6' (1.829 m)   Wt 160 lb 12.8 oz (72.9 kg)   BMI 21.81 kg/m     Wt Readings from Last 3 Encounters:  10/05/15 160 lb 12.8 oz (72.9 kg)  09/14/15 161 lb (73 kg)  09/09/15 161 lb 6.4 oz (73.2 kg)     Physical Exam  Constitutional: He is oriented to person, place, and time. He appears well-developed and well-nourished. No distress.  HENT:  Head: Normocephalic and atraumatic.  Eyes: Pupils are equal, round, and reactive to light. No scleral icterus.  Neck: No JVD present. Carotid bruit is not present.  Cardiovascular: Normal rate, regular rhythm, S1 normal and S2 normal.   No murmur heard. Abdominal: Soft. There is no tenderness.  Musculoskeletal: He exhibits no edema.  Neurological: He is alert and oriented to person, place, and time.  Skin: Skin is warm and dry.  Psychiatric: He has a normal mood and affect.    ASSESSMENT:    1. Subdural hematoma (HCC)   2. Persistent atrial fibrillation ([place])   3. Essential hypertension   4. Tobacco abuse    PLAN:    In order of problems listed above:  1. Subdural Hematoma - The patient presented for FU s/p recent DCCV today.  He noted severe L sided HAs over th past few weeks and last night developed transient confusion that last about 30 minutes.  We obtained a stat Head CT that shows a large L sided subdural hematoma with midline shift.  He is being transported via Ambulance.  Dr.  Acie Fredrickson has also seen him and has paged Dr. Arnoldo Morale (Neurosurgery on call).  He did take his Eliquis this AM.  Our service will need to follow him to help with anticoagulation.    2. Persistent AF - He is s/p DCCV 09/14/15.  Maintaining NSR.  CHADS2-VASc=2 (HTN, age 22).  However, with subdural hematoma he is likely not a good candidate for long term  anticoagulation.   3. HTN - BP elevated today.  This is likely related to his acute illness.  Defer management of BP to NS.   4. Tobacco abuse - He continues to smoke and knows that he needs to quit.   Medication Adjustments/Labs and Tests Ordered: Current medicines are reviewed at length with the patient today.  Concerns regarding medicines are outlined above.  Medication changes, Labs and Tests ordered today are outlined in the Patient Instructions noted below. Patient Instructions  Patient sent to ED at [facility].  Signed, Richardson Dopp, PA-C  10/05/2015 10:05 AM    [facility] [place], [place], [place]  [address] Phone: [phone]; Fax: [phone]   Attending Note:   The patient was seen and examined.  Agree with assessment and plan as noted above.  Changes made to the above note as needed.  Patient seen and independently examined with Richardson Dopp, PA.   We discussed all aspects of the encounter. I agree with the assessment and plan as stated above.  Richardson Landry has done well from a cardiac standpoint He reports some neuro issues last night - confusion about how to turn off the kitchen light in his home Some other subtile neuro issues Have resolved this am Has had some headaches  Fell 5-6 weeks ago ( about a week prior to being diagnosed with afib and starting Eliquis )  Emergent CT of the head shows a subdural hematoma With mass effect.  Have discussed this with Dr. Arnoldo Morale ( neurosurgery ) Will hold Eliquis. Anticipate surgical craniotomy tomorrow to drain subdural hematoma    I have spent a total of  40 minutes with patient reviewing hospital  notes , telemetry, EKGs, labs and examining patient as well as establishing an assessment and plan that was discussed with the patient. > 50% of time was spent in direct patient care.    [place] Headings, Brooke Bonito., MD, [facility] 10/05/2015, 12:43 PM [address],  [place] Pager [phone]

## 2015-10-05 ENCOUNTER — Encounter: Payer: Self-pay | Admitting: Physician Assistant

## 2015-10-05 ENCOUNTER — Ambulatory Visit (INDEPENDENT_AMBULATORY_CARE_PROVIDER_SITE_OTHER): Payer: Medicare Other | Admitting: Physician Assistant

## 2015-10-05 ENCOUNTER — Emergency Department (HOSPITAL_COMMUNITY): Payer: Medicare Other

## 2015-10-05 ENCOUNTER — Inpatient Hospital Stay (HOSPITAL_COMMUNITY)
Admission: EM | Admit: 2015-10-05 | Discharge: 2015-10-07 | DRG: 065 | Disposition: A | Discharge status: Home / Self Care | Payer: Medicare Other | Attending: Neurosurgery | Admitting: Neurosurgery

## 2015-10-05 ENCOUNTER — Encounter (HOSPITAL_COMMUNITY): Payer: Self-pay | Admitting: Emergency Medicine

## 2015-10-05 ENCOUNTER — Ambulatory Visit (INDEPENDENT_AMBULATORY_CARE_PROVIDER_SITE_OTHER)
Admission: RE | Admit: 2015-10-05 | Discharge: 2015-10-05 | Disposition: A | Discharge status: Home / Self Care | Payer: Medicare Other | Source: Ambulatory Visit | Attending: Physician Assistant | Admitting: Physician Assistant

## 2015-10-05 VITALS — BP 170/82 | HR 65 | Ht 72.0 in | Wt 160.8 lb

## 2015-10-05 DIAGNOSIS — I4819 Other persistent atrial fibrillation: Secondary | ICD-10-CM

## 2015-10-05 DIAGNOSIS — Z7901 Long term (current) use of anticoagulants: Secondary | ICD-10-CM

## 2015-10-05 DIAGNOSIS — F17210 Nicotine dependence, cigarettes, uncomplicated: Secondary | ICD-10-CM | POA: Diagnosis present

## 2015-10-05 DIAGNOSIS — F419 Anxiety disorder, unspecified: Secondary | ICD-10-CM | POA: Diagnosis present

## 2015-10-05 DIAGNOSIS — G939 Disorder of brain, unspecified: Secondary | ICD-10-CM

## 2015-10-05 DIAGNOSIS — I481 Persistent atrial fibrillation: Secondary | ICD-10-CM | POA: Diagnosis present

## 2015-10-05 DIAGNOSIS — E871 Hypo-osmolality and hyponatremia: Secondary | ICD-10-CM | POA: Diagnosis present

## 2015-10-05 DIAGNOSIS — S065XAA Traumatic subdural hemorrhage with loss of consciousness status unknown, initial encounter: Secondary | ICD-10-CM

## 2015-10-05 DIAGNOSIS — I6200 Nontraumatic subdural hemorrhage, unspecified: Secondary | ICD-10-CM | POA: Diagnosis not present

## 2015-10-05 DIAGNOSIS — I119 Hypertensive heart disease without heart failure: Secondary | ICD-10-CM | POA: Diagnosis present

## 2015-10-05 DIAGNOSIS — R51 Headache: Secondary | ICD-10-CM

## 2015-10-05 DIAGNOSIS — I10 Essential (primary) hypertension: Secondary | ICD-10-CM | POA: Diagnosis not present

## 2015-10-05 DIAGNOSIS — S065X9A Traumatic subdural hemorrhage with loss of consciousness of unspecified duration, initial encounter: Secondary | ICD-10-CM

## 2015-10-05 DIAGNOSIS — Z720 Tobacco use: Secondary | ICD-10-CM

## 2015-10-05 DIAGNOSIS — R4702 Dysphasia: Secondary | ICD-10-CM | POA: Diagnosis present

## 2015-10-05 DIAGNOSIS — R413 Other amnesia: Secondary | ICD-10-CM | POA: Diagnosis present

## 2015-10-05 DIAGNOSIS — R402412 Glasgow coma scale score 13-15, at arrival to emergency department: Secondary | ICD-10-CM | POA: Diagnosis present

## 2015-10-05 DIAGNOSIS — Z8249 Family history of ischemic heart disease and other diseases of the circulatory system: Secondary | ICD-10-CM | POA: Diagnosis not present

## 2015-10-05 DIAGNOSIS — R410 Disorientation, unspecified: Secondary | ICD-10-CM | POA: Diagnosis not present

## 2015-10-05 DIAGNOSIS — I480 Paroxysmal atrial fibrillation: Secondary | ICD-10-CM | POA: Diagnosis not present

## 2015-10-05 DIAGNOSIS — G9389 Other specified disorders of brain: Secondary | ICD-10-CM

## 2015-10-05 DIAGNOSIS — I6201 Nontraumatic acute subdural hemorrhage: Principal | ICD-10-CM | POA: Diagnosis present

## 2015-10-05 DIAGNOSIS — Z79899 Other long term (current) drug therapy: Secondary | ICD-10-CM

## 2015-10-05 DIAGNOSIS — B1920 Unspecified viral hepatitis C without hepatic coma: Secondary | ICD-10-CM | POA: Diagnosis present

## 2015-10-05 DIAGNOSIS — R519 Headache, unspecified: Secondary | ICD-10-CM

## 2015-10-05 LAB — TYPE AND SCREEN
ABO/RH(D): O POS
Antibody Screen: NEGATIVE

## 2015-10-05 LAB — CBC WITH DIFFERENTIAL/PLATELET
Basophils Absolute: 0 10*3/uL (ref 0.0–0.1)
Basophils Relative: 0 %
Eosinophils Absolute: 0.2 10*3/uL (ref 0.0–0.7)
Eosinophils Relative: 2 %
HEMATOCRIT: 42.2 % (ref 39.0–52.0)
Hemoglobin: 14.6 g/dL (ref 13.0–17.0)
LYMPHS PCT: 32 %
Lymphs Abs: 2.5 10*3/uL (ref 0.7–4.0)
MCH: 32.9 pg (ref 26.0–34.0)
MCHC: 34.6 g/dL (ref 30.0–36.0)
MCV: 95 fL (ref 78.0–100.0)
MONO ABS: 1.3 10*3/uL — AB (ref 0.1–1.0)
MONOS PCT: 17 %
NEUTROS ABS: 3.8 10*3/uL (ref 1.7–7.7)
Neutrophils Relative %: 49 %
Platelets: 226 10*3/uL (ref 150–400)
RBC: 4.44 MIL/uL (ref 4.22–5.81)
RDW: 12.6 % (ref 11.5–15.5)
WBC: 7.9 10*3/uL (ref 4.0–10.5)

## 2015-10-05 LAB — COMPREHENSIVE METABOLIC PANEL
ALT: 13 U/L — AB (ref 17–63)
AST: 20 U/L (ref 15–41)
Albumin: 3.8 g/dL (ref 3.5–5.0)
Alkaline Phosphatase: 90 U/L (ref 38–126)
Anion gap: 8 (ref 5–15)
BUN: <5 mg/dL — ABNORMAL LOW (ref 6–20)
CHLORIDE: 96 mmol/L — AB (ref 101–111)
CO2: 26 mmol/L (ref 22–32)
CREATININE: 0.74 mg/dL (ref 0.61–1.24)
Calcium: 9.3 mg/dL (ref 8.9–10.3)
GFR calc non Af Amer: >60 mL/min (ref >60)
Glucose, Bld: 99 mg/dL (ref 65–99)
POTASSIUM: 4.3 mmol/L (ref 3.5–5.1)
SODIUM: 130 mmol/L — AB (ref 135–145)
Total Bilirubin: 1.2 mg/dL (ref 0.3–1.2)
Total Protein: 7.1 g/dL (ref 6.5–8.1)

## 2015-10-05 LAB — PROTIME-INR
INR: 1.27
Prothrombin Time: 16 seconds — ABNORMAL HIGH (ref 11.4–15.2)

## 2015-10-05 LAB — APTT: APTT: 41 s — AB (ref 24–36)

## 2015-10-05 LAB — ABO/RH: ABO/RH(D): O POS

## 2015-10-05 MED ORDER — LORAZEPAM 0.5 MG PO TABS
0.5000 mg | ORAL_TABLET | Freq: Four times a day (QID) | ORAL | Status: DC | PRN
  Administered 2015-10-05 – 2015-10-07 (×8): 0.5 mg via ORAL
  Filled 2015-10-05 (×8): qty 1

## 2015-10-05 MED ORDER — SODIUM CHLORIDE 0.9 % IV SOLN: 250.0000 mL | INTRAVENOUS | Status: DC | PRN

## 2015-10-05 MED ORDER — LORAZEPAM 1 MG PO TABS
0.5000 mg | ORAL_TABLET | Freq: Once | ORAL | Status: AC
  Administered 2015-10-05: 0.5 mg via ORAL
  Filled 2015-10-05: qty 1

## 2015-10-05 MED ORDER — ACETAMINOPHEN 325 MG PO TABS
650.0000 mg | ORAL_TABLET | Freq: Once | ORAL | Status: AC
  Administered 2015-10-05: 650 mg via ORAL
  Filled 2015-10-05: qty 2

## 2015-10-05 MED ORDER — LISINOPRIL 10 MG PO TABS
10.0000 mg | ORAL_TABLET | Freq: Every day | ORAL | Status: DC
  Administered 2015-10-05 – 2015-10-07 (×3): 10 mg via ORAL
  Filled 2015-10-05 (×4): qty 1

## 2015-10-05 MED ORDER — CARVEDILOL 6.25 MG PO TABS
6.2500 mg | ORAL_TABLET | Freq: Two times a day (BID) | ORAL | Status: DC
  Administered 2015-10-05 – 2015-10-07 (×4): 6.25 mg via ORAL
  Filled 2015-10-05 (×5): qty 1

## 2015-10-05 MED ORDER — ACETAMINOPHEN 500 MG PO TABS
1000.0000 mg | ORAL_TABLET | Freq: Four times a day (QID) | ORAL | Status: DC | PRN
  Administered 2015-10-05 – 2015-10-07 (×8): 1000 mg via ORAL
  Filled 2015-10-05 (×8): qty 2

## 2015-10-05 MED ORDER — POTASSIUM CHLORIDE 2 MEQ/ML IV SOLN
INTRAVENOUS | Status: DC
  Administered 2015-10-05 – 2015-10-07 (×3): via INTRAVENOUS
  Filled 2015-10-05 (×7): qty 1000

## 2015-10-05 MED ORDER — NICOTINE 21 MG/24HR TD PT24
21.0000 mg | MEDICATED_PATCH | Freq: Every day | TRANSDERMAL | Status: DC
  Administered 2015-10-05 – 2015-10-07 (×3): 21 mg via TRANSDERMAL
  Filled 2015-10-05 (×3): qty 1

## 2015-10-05 NOTE — Patient Instructions (Signed)
Patient sent to ED at [facility].

## 2015-10-05 NOTE — ED Provider Notes (Signed)
30 2017.  [facility] DEPT Provider Note   CSN: [id number] Arrival date & time: 10/05/15  1009     History   Chief Complaint Chief Complaint  Patient presents with  . Memory Loss    HPI  Blood pressure 168/84, pulse 61, temperature 97.8 F (36.6 C), temperature source Oral, resp. rate 17, height 6' (1.829 m), weight 72.6 kg, SpO2 100 %.  Kevin Leon is a 65 y.o. male with past medical history significant for hepatitis C, tobacco use, hypertension sent from imaging suite for subdural hematoma. Patient is reporting a left-sided headache onset several weeks ago described as severe in atypical, he had some difficulty with memory and word finding onset yesterday. He denies dysarthria, ataxia, change in vision, chest pain, shortness of breath, palpitations. Patient was recently diagnosed with atrial fibrillation in early August 2017, he was started on Eliquis which she's been taking regularly, last dose was this a.m. Patient was cardioverted on 09/14/2015. Patient religiously checks his blood pressure twice a day and also has an irregular heart rate flag on BP monitor which has not occurred after the cardioversion and furthermore he has been asymptomatic. States that he knew he was in A. fib by the feeling of fluttering in his chest. Patient drinks alcohol occasionally but states this is not an issue for him and he has no history of cirrhosis or liver issues. He doesn't take aspirin or NSAIDs regularly. Patient is a remote heroin user and declines narcotic pain medication. He has a history of anxiety which she takes Ativan for a he has taken this for many years and has not required escalating doses.   HPI  Past Medical History:  Diagnosis Date  . Anxiety   . Hepatitis C   . History of stress test    ETT-Echo in 2010 was normal  . HTN (hypertension)   . Persistent atrial fibrillation ([place]) 09/09/2015   CHADS2-VASc=2 //  Apixaban //  s/p DCCV 09/14/15  . Right rotator cuff tear 10/08/2014     Patient Active Problem List   Diagnosis Date Noted  . Subdural hematoma ([place]) 10/05/2015  . Essential hypertension 10/04/2015  . Tobacco abuse 10/04/2015  . Persistent atrial fibrillation ([place]) 09/09/2015  . Right rotator cuff tear 10/08/2014    Past Surgical History:  Procedure Laterality Date  . CARDIOVERSION [name] 09/14/2015   Procedure: CARDIOVERSION;  Surgeon: Thayer Headings, MD;  Location: [facility] ENDOSCOPY;  Service: Cardiovascular;  Laterality: [name];  . large piece of wood removed from buttocks     as child  . SHOULDER ARTHROSCOPY WITH ROTATOR CUFF REPAIR Right 10/08/2014   Procedure: RIGHT SHOULDER ARTHROSCOPY, DEBRIDEMENT, ACROMIOPLASTY WITH ROTATOR CUFF REPAIR;  Surgeon: Marchia Bond, MD;  Location: [facility];  Service: Orthopedics;  Laterality: Right;  . TONSILLECTOMY         Home Medications    Prior to Admission medications   Medication Sig Start Date End Date Taking? Authorizing Provider  apixaban (ELIQUIS) 5 MG TABS tablet Take 1 tablet (5 mg total) by mouth 2 (two) times daily. 09/09/15   Liliane Shi, PA-C  carvedilol (COREG) 6.25 MG tablet TAKE 1 TABLET (6.25 MG TOTAL) BY MOUTH 2 (TWO) TIMES DAILY WITH A MEAL. 07/22/15   Thayer Headings, MD  lisinopril (PRINIVIL,ZESTRIL) 10 MG tablet Take 1 tablet (10 mg total) by mouth daily. 08/19/15 11/17/15  Thayer Headings, MD  LORazepam (ATIVAN) 0.5 MG tablet Take 0.5 mg by mouth every 6 (six) hours as needed  for anxiety.  04/21/11   Historical Provider, MD    Family History Family History  Problem Relation Age of Onset  . Hypertension Mother   . Heart attack Neg Hx     Social History Social History  Substance Use Topics  . Smoking status: Current Every Day Smoker    Packs/day: 0.50    Types: Cigarettes    Last attempt to quit: 02/15/2014  . Smokeless tobacco: Never Used  . Alcohol use 0.0 oz/week     Comment:  2 beers daily     Allergies   Review of patient's allergies indicates no known  allergies.   Review of Systems Review of Systems  10 systems reviewed and found to be negative, except as noted in the HPI.   Physical Exam Updated Vital Signs BP 168/84   Pulse 61   Temp 97.8 F (36.6 C) (Oral)   Resp 17   Ht 6' (1.829 m)   Wt 72.6 kg   SpO2 100%   BMI 21.70 kg/m   Physical Exam  Constitutional: He is oriented to person, place, and time. He appears well-developed and well-nourished.  HENT:  Head: Normocephalic and atraumatic.  Mouth/Throat: Oropharynx is clear and moist.  Eyes: Conjunctivae and EOM are normal. Pupils are equal, round, and reactive to light.  No TTP of maxillary or frontal sinuses  No TTP or induration of temporal arteries bilaterally  Neck: Normal range of motion. Neck supple.  FROM to C-spine. Pt can touch chin to chest without discomfort. No TTP of midline cervical spine.   Cardiovascular: Normal rate, regular rhythm and intact distal pulses.   Pulmonary/Chest: Effort normal and breath sounds normal. No respiratory distress. He has no wheezes. He has no rales. He exhibits no tenderness.  Abdominal: Soft. Bowel sounds are normal. There is no tenderness.  Musculoskeletal: Normal range of motion. He exhibits no edema or tenderness.  Neurological: He is alert and oriented to person, place, and time. No cranial nerve deficit.  II-Visual fields grossly intact. III/IV/VI-Extraocular movements intact.  Pupils reactive bilaterally. V/VII-Smile symmetric, equal eyebrow raise,  facial sensation intact VIII- Hearing grossly intact IX/X-Normal gag XI-bilateral shoulder shrug XII-midline tongue extension Motor: 5/5 bilaterally with normal tone and bulk Cerebellar: Normal finger-to-nose  and normal heel-to-shin test.     Nursing note and vitals reviewed.    ED Treatments / Results  Labs (all labs ordered are listed, but only abnormal results are displayed) Labs Reviewed  CBC WITH DIFFERENTIAL/PLATELET - Abnormal; Notable for the  following:       Result Value   Monocytes Absolute 1.3 (*)    All other components within normal limits  PROTIME-INR - Abnormal; Notable for the following:    Prothrombin Time 16.0 (*)    All other components within normal limits  APTT - Abnormal; Notable for the following:    aPTT 41 (*)    All other components within normal limits  COMPREHENSIVE METABOLIC PANEL - Abnormal; Notable for the following:    Sodium 130 (*)    Chloride 96 (*)    BUN <5 (*)    ALT 13 (*)    All other components within normal limits  TYPE AND SCREEN    EKG  EKG Interpretation  Date/Time:  Wednesday October 05 2015 10:36:50 EDT Ventricular Rate:  67 PR Interval:    QRS Duration: 91 QT Interval:  424 QTC Calculation: 448 R Axis:   70 Text Interpretation:  Sinus rhythm Left atrial enlargement Confirmed by Ralene Bathe,  Kathlee Nations [phone]) on 10/05/2015 10:47:40 AM       Radiology Ct Head Wo Contrast  Result Date: 10/05/2015 CLINICAL DATA:  [place] skateboarding. No memory of incident. Blacked out and woke with blood on chin. On blood thinner. H/A's and transient memory loss. EXAM: CT HEAD WITHOUT CONTRAST TECHNIQUE: Contiguous axial images were obtained from the base of the skull through the vertex without intravenous contrast. COMPARISON:  07/27/2007 FINDINGS: Brain: There is a significant acute left-sided subdural hematoma, indenting the left frontal and parietal lobes, measuring 19 mm in greatest thickness. It causes midline shift to the right of 7 mm. No other intracranial hemorrhage. The ventricles are normal in size and configuration. There are no parenchymal masses or mass effect. There is no evidence of an infarct. Vascular: No hyperdense vessel or unexpected calcification. Skull: No skull fracture.  No skull lesion. Sinuses/Orbits: No acute finding. Other: No scalp hematoma. IMPRESSION: 1. Significant left-sided subdural hemorrhage with mass effect and midline shift to the right of 7 mm. 2. No parenchymal  hemorrhage. No other abnormality. No skull fracture. These results were called by telephone at the time of interpretation on 10/05/2015 at 9:31 am to Dr. Richardson Dopp , who verbally acknowledged these results. Electronically Signed   By: Lajean Manes M.D.   On: 10/05/2015 09:37   Dg Chest Port 1 View  Result Date: 10/05/2015 CLINICAL DATA:  Preop brain hemorrhage EXAM: PORTABLE CHEST 1 VIEW COMPARISON:  CT chest dated 01/19/2010 FINDINGS: Lungs are clear.  No pleural effusion or pneumothorax. The heart is normal in size. IMPRESSION: No evidence of acute cardiopulmonary disease. Electronically Signed   By: Julian Hy M.D.   On: 10/05/2015 11:25    Procedures Procedures (including critical care time)  CRITICAL CARE Performed by: Monico Blitz   Total critical care time: 35 minutes  Critical care time was exclusive of separately billable procedures and treating other patients.  Critical care was necessary to treat or prevent imminent or life-threatening deterioration.  Critical care was time spent personally by me on the following activities: development of treatment plan with patient and/or surrogate as well as nursing, discussions with consultants, evaluation of patient's response to treatment, examination of patient, obtaining history from patient or surrogate, ordering and performing treatments and interventions, ordering and review of laboratory studies, ordering and review of radiographic studies, pulse oximetry and re-evaluation of patient's condition.   Medications Ordered in ED Medications  nicotine (NICODERM CQ - dosed in mg/24 hours) patch 21 mg (21 mg Transdermal Patch Applied 10/05/15 1207)  lisinopril (PRINIVIL,ZESTRIL) tablet 10 mg (not administered)  carvedilol (COREG) tablet 6.25 mg (not administered)  LORazepam (ATIVAN) tablet 0.5 mg (not administered)  0.9 %  sodium chloride infusion (not administered)  lactated ringers 1,000 mL with potassium chloride 20 mEq  infusion (not administered)  acetaminophen (TYLENOL) tablet 650 mg (650 mg Oral Given 10/05/15 1207)  LORazepam (ATIVAN) tablet 0.5 mg (0.5 mg Oral Given 10/05/15 1207)     Initial Impression / Assessment and Plan / ED Course  I have reviewed the triage vital signs and the nursing notes.  Pertinent labs & imaging results that were available during my care of the patient were reviewed by me and considered in my medical decision making (see chart for details).  Clinical Course    Vitals:   10/05/15 1015 10/05/15 1021 10/05/15 1030  BP: 180/84 180/84 168/84  Pulse: 69 69 61  Resp: 12 14 17   Temp:  97.8 F (36.6 C)   TempSrc:  Oral   SpO2: 100% 100% 100%  Weight:  72.6 kg   Height:  6' (1.829 m)     Medications  nicotine (NICODERM CQ - dosed in mg/24 hours) patch 21 mg (21 mg Transdermal Patch Applied 10/05/15 1207)  lisinopril (PRINIVIL,ZESTRIL) tablet 10 mg (not administered)  carvedilol (COREG) tablet 6.25 mg (not administered)  LORazepam (ATIVAN) tablet 0.5 mg (not administered)  0.9 %  sodium chloride infusion (not administered)  lactated ringers 1,000 mL with potassium chloride 20 mEq infusion (not administered)  acetaminophen (TYLENOL) tablet 650 mg (650 mg Oral Given 10/05/15 1207)  LORazepam (ATIVAN) tablet 0.5 mg (0.5 mg Oral Given 10/05/15 1207)    THEORDORE VOLBRECHT is 65 y.o. male presenting with Left-sided subdural hematoma indenting the left frontal and parietal lobes With 7 mm of midline shift. Surprisingly, this patient is clinically doing quite well with a normal neurologic exam, he reported a left-sided headache onset 2-3 weeks ago and some difficulty with memory and word finding starting last night. He is anticoagulated with Eliquis for A. fib however he was cardioverted approximately 3 weeks ago.  Neurosurgery consult from Dr. Arnoldo Morale appreciated: He will admit to the ICU at 3100.  This is a shared visit with the attending physician who personally evaluated the  patient and agrees with the care plan.    Final Clinical Impressions(s) / ED Diagnoses   Final diagnoses:  Subdural hematoma (HCC)  Chronic anticoagulation  Midline shift of brain due to hematoma    New Prescriptions New Prescriptions   No medications on file     Monico Blitz, PA-C 10/05/15 [address], PA-C 10/05/15 1345    Quintella Reichert, MD 10/06/15 0710

## 2015-10-05 NOTE — H&P (Signed)
Subjective: The patient is a 65 year old white male who recalls striking his head about 2 months ago. He subsequently developed A. fib and was started on Eliquis anticoagulation. The patient had some periods of dysphasia and confusion. He was worked up with a head CT by Dr. Cathie Olden which demonstrated a left subdural hematoma. The patient is admitted to [facility] for further management.  Presently the patient is alert and pleasant. He admits to an intermittent moderate headache. He's had no seizures, nausea, vomiting, etc. By report he had some episodes of difficulty with his speech and mild confusion over the last few days.  Past Medical History:  Diagnosis Date  . Anxiety   . Hepatitis C   . History of stress test    ETT-Echo in 2010 was normal  . HTN (hypertension)   . Persistent atrial fibrillation ([place]) 09/09/2015   CHADS2-VASc=2 //  Apixaban //  s/p DCCV 09/14/15  . Right rotator cuff tear 10/08/2014    Past Surgical History:  Procedure Laterality Date  . CARDIOVERSION [name] 09/14/2015   Procedure: CARDIOVERSION;  Surgeon: Thayer Headings, MD;  Location: [facility] ENDOSCOPY;  Service: Cardiovascular;  Laterality: [name];  . large piece of wood removed from buttocks     as child  . SHOULDER ARTHROSCOPY WITH ROTATOR CUFF REPAIR Right 10/08/2014   Procedure: RIGHT SHOULDER ARTHROSCOPY, DEBRIDEMENT, ACROMIOPLASTY WITH ROTATOR CUFF REPAIR;  Surgeon: Marchia Bond, MD;  Location: [facility];  Service: Orthopedics;  Laterality: Right;  . TONSILLECTOMY      No Known Allergies  Social History  Substance Use Topics  . Smoking status: Current Every Day Smoker    Packs/day: 0.50    Types: Cigarettes    Last attempt to quit: 02/15/2014  . Smokeless tobacco: Never Used  . Alcohol use 0.0 oz/week     Comment:  2 beers daily    Family History  Problem Relation Age of Onset  . Hypertension Mother   . Heart attack Neg Hx    Prior to Admission medications   Medication Sig Start  Date End Date Taking? Authorizing Provider  apixaban (ELIQUIS) 5 MG TABS tablet Take 1 tablet (5 mg total) by mouth 2 (two) times daily. 09/09/15   Liliane Shi, PA-C  carvedilol (COREG) 6.25 MG tablet TAKE 1 TABLET (6.25 MG TOTAL) BY MOUTH 2 (TWO) TIMES DAILY WITH A MEAL. 07/22/15   Thayer Headings, MD  lisinopril (PRINIVIL,ZESTRIL) 10 MG tablet Take 1 tablet (10 mg total) by mouth daily. 08/19/15 11/17/15  Thayer Headings, MD  LORazepam (ATIVAN) 0.5 MG tablet Take 0.5 mg by mouth every 6 (six) hours as needed for anxiety.  04/21/11   Historical Provider, MD     Review of Systems  Positive ROS: As above, the patient denies neck pain.  All other systems have been reviewed and were otherwise negative with the exception of those mentioned in the HPI and as above.  Objective: Vital signs in last 24 hours: Temp:  [97.8 F (36.6 C)] 97.8 F (36.6 C) (09/20 1021) Pulse Rate:  [61-69] 61 (09/20 1030) Resp:  [12-17] 17 (09/20 1030) BP: (168-180)/(82-84) 168/84 (09/20 1030) SpO2:  [100 %] 100 % (09/20 1030) Weight:  [72.6 kg (160 lb)-72.9 kg (160 lb 12.8 oz)] 72.6 kg (160 lb) (09/20 1021)  Physical exam:  General: An alert and pleasant thin 65 year old white male in no apparent distress.  HEENT: Normocephalic, atraumatic, pupils equal round reactive light, extraocular muscles are intact.  Neck: Supple without  masses or deformities. He has a normal range of motion.  Thorax: Symmetric  Abdomen: Soft  Extremities: Unremarkable  Heart: regular rhythm   Neurologic exam: The patient is alert and oriented 3. Glasgow Coma Scale 15. Cranial nerves II through XII were examined bilaterally and grossly normal. Vision and hearing are grossly normal bilaterally. The patient's motor strength is 5 over 5 in the bilateral biceps, triceps, hand grip, quadriceps, gastrocnemius, dorsiflexors. Cerebellar function is intact to rapid alternate movements of the upper extremities bilaterally. Sensory function is  grossly intact to light touch sensation all tested dermatomes bilaterally.  I have reviewed the patient's head CT performed today. It demonstrates a left subacute subdural hematoma with some loculations. There is left to right midline shift.  Data Review Lab Results  Component Value Date   WBC 7.9 10/05/2015   HGB 14.6 10/05/2015   HCT 42.2 10/05/2015   MCV 95.0 10/05/2015   PLT 226 10/05/2015   Lab Results  Component Value Date   NA 130 (L) 10/05/2015   K 4.3 10/05/2015   CL 96 (L) 10/05/2015   CO2 26 10/05/2015   BUN <5 (L) 10/05/2015   CREATININE 0.74 10/05/2015   GLUCOSE 99 10/05/2015   Lab Results  Component Value Date   INR 1.27 10/05/2015    Assessment/Plan: Left subdural hematoma, anticoagulation: I have discussed the situation with the patient. Clinically he is doing very well and is presently anticoagulated. I recommended that the patient be admitted to ICU for observation. We will stop his Eliquis. I'll plan to repeat his CAT scan tomorrow. If the patient is clinically stable and his CAT scan is no worse than we will likely continue observation. If his CAT scan worsens or he develops neurologic symptoms, then we will likely need to do surgery. I have answered all the patient's questions.   Corvette Orser D 10/05/2015 1:07 PM

## 2015-10-05 NOTE — ED Triage Notes (Signed)
Pt here from md office after getting a CT of head after a fall about a month ago , CT showed SDH.  Pt is on blood thinners Pt has also has some memory loss

## 2015-10-06 ENCOUNTER — Encounter (HOSPITAL_COMMUNITY): Payer: Self-pay | Admitting: Radiology

## 2015-10-06 ENCOUNTER — Encounter (HOSPITAL_COMMUNITY)
Admission: EM | Disposition: A | Discharge status: Home / Self Care | Payer: Self-pay | Source: Home / Self Care | Attending: Neurosurgery

## 2015-10-06 ENCOUNTER — Inpatient Hospital Stay (HOSPITAL_COMMUNITY): Payer: Medicare Other

## 2015-10-06 DIAGNOSIS — I6200 Nontraumatic subdural hemorrhage, unspecified: Secondary | ICD-10-CM

## 2015-10-06 DIAGNOSIS — Z720 Tobacco use: Secondary | ICD-10-CM

## 2015-10-06 DIAGNOSIS — I480 Paroxysmal atrial fibrillation: Secondary | ICD-10-CM

## 2015-10-06 LAB — GLUCOSE, CAPILLARY: Glucose-Capillary: 82 mg/dL (ref 65–99)

## 2015-10-06 LAB — MRSA PCR SCREENING: MRSA by PCR: NEGATIVE

## 2015-10-06 SURGERY — CRANIOTOMY HEMATOMA EVACUATION SUBDURAL
Anesthesia: General | Laterality: Left

## 2015-10-06 NOTE — Care Management Note (Signed)
Case Management Note  Patient Details  Name: JUSIN QUESINBERRY MRN: [id number] Date of Birth: 08/07/1950  Subjective/Objective:     Admitted with subdural hematoma               Action/Plan:  PTA from home with wife on anticoagulation for A fib.  CM will continue to follow for discharge needs   Expected Discharge Date:                  Expected Discharge Plan:     In-House Referral:     Discharge planning Services  CM Consult  Post Acute Care Choice:    Choice offered to:     DME Arranged:    DME Agency:     HH Arranged:    HH Agency:     Status of Service:  In process, will continue to follow  If discussed at Long Length of Stay Meetings, dates discussed:    Additional Comments:  Maryclare Labrador, RN 10/06/2015, 9:12 AM

## 2015-10-06 NOTE — Progress Notes (Signed)
Patient Name: Kevin Leon Date of Encounter: 10/06/2015  Primary Cardiologist: Dr. Antonieta Loveless Problem List     Active Problems:   Subdural hematoma (HCC)     Subjective   Talkative. No CP. Mild confusion he states. Was anxious about telemetry saying "irreg heart beat". Reassured him that it was artifact.   Inpatient Medications    Scheduled Meds: . carvedilol  6.25 mg Oral BID WC  . lisinopril  10 mg Oral Daily  . nicotine  21 mg Transdermal Daily   Continuous Infusions: . lactated ringers with kcl 75 mL/hr at 10/06/15 0656   PRN Meds:.sodium chloride, acetaminophen, LORazepam   Vital Signs    Vitals:   10/06/15 0700 10/06/15 0748 10/06/15 0800 10/06/15 0900  BP: 132/67  130/62 116/63  Pulse: 61  69 (!) 58  Resp: 12  18 13   Temp:  98.3 F (36.8 C)    TempSrc:  Oral    SpO2: 98%  99% 99%  Weight:      Height:        Intake/Output Summary (Last 24 hours) at 10/06/15 1017 Last data filed at 10/06/15 0900  Gross per 24 hour  Intake              675 ml  Output              550 ml  Net              125 ml   Filed Weights   10/05/15 1021 10/06/15 0020 10/06/15 0358  Weight: 160 lb (72.6 kg) 154 lb 5.2 oz (70 kg) 154 lb 5.2 oz (70 kg)    Physical Exam    GEN: Well nourished, well developed, in no acute distress.  HEENT: Grossly normal.  Neck: Supple, no JVD, carotid bruits, or masses. Cardiac: RRR, no murmurs, rubs, or gallops. No clubbing, cyanosis, edema.  Radials/DP/PT 2+ and equal bilaterally.  Respiratory:  Respirations regular and unlabored, clear to auscultation bilaterally. GI: Soft, nontender, nondistended, BS + x 4. MS: no deformity or atrophy. Skin: warm and dry, no rash. Neuro:  Strength and sensation are intact. Psych: AAOx3.  Normal affect.  Labs    CBC  Recent Labs  10/05/15 1050  WBC 7.9  NEUTROABS 3.8  HGB 14.6  HCT 42.2  MCV 95.0  PLT [id number]   Basic Metabolic Panel  Recent Labs  10/05/15 1050  NA 130*  K 4.3    CL 96*  CO2 26  GLUCOSE 99  BUN <5*  CREATININE 0.74  CALCIUM 9.3   Liver Function Tests  Recent Labs  10/05/15 1050  AST 20  ALT 13*  ALKPHOS 90  BILITOT 1.2  PROT 7.1  ALBUMIN 3.8    Telemetry    NSR, rare PVC - Personally Reviewed  ECG    NSR - Personally Reviewed  Radiology    Ct Head Wo Contrast  Result Date: 10/06/2015 CLINICAL DATA:  Follow-up exam for subdural hematoma EXAM: CT HEAD WITHOUT CONTRAST TECHNIQUE: Contiguous axial images were obtained from the base of the skull through the vertex without intravenous contrast. COMPARISON:  Prior CT from 10/05/2015. FINDINGS: Brain: Left holo hemispheric subdural hematoma again seen. Hematoma measures up to 21 mm in maximal thickness on today's study. Mass effect on the subjacent left cerebral hemisphere, similar. Left-to-right shift is relatively similar measuring 8 mm on today's study. No hydrocephalus or evidence for ventricular trapping. No other acute intracranial hemorrhage. No evidence for acute infarct.  No mass lesion. Vascular: Mild scattered vascular calcifications within the carotid siphons. No hyperdense vessel. Skull: Scalp soft tissues within normal limits.  Calvarium intact. Sinuses/Orbits: Orbital soft tissues within normal limits. Globes intact. Paranasal sinuses are clear. No mastoid effusion. Other: No other significant finding. IMPRESSION: 1. Overall little interval change in size and appearance of acute left subdural hematoma with similar mass effect on the subjacent left cerebral hemisphere. Left-to-right shift measures 8 mm on today's study, previously 7 mm. 2. No other new acute intracranial process. Electronically Signed   By: Jeannine Boga M.D.   On: 10/06/2015 05:26   Ct Head Wo Contrast  Result Date: 10/05/2015 CLINICAL DATA:  [place] skateboarding. No memory of incident. Blacked out and woke with blood on chin. On blood thinner. H/A's and transient memory loss. EXAM: CT HEAD WITHOUT CONTRAST  TECHNIQUE: Contiguous axial images were obtained from the base of the skull through the vertex without intravenous contrast. COMPARISON:  07/27/2007 FINDINGS: Brain: There is a significant acute left-sided subdural hematoma, indenting the left frontal and parietal lobes, measuring 19 mm in greatest thickness. It causes midline shift to the right of 7 mm. No other intracranial hemorrhage. The ventricles are normal in size and configuration. There are no parenchymal masses or mass effect. There is no evidence of an infarct. Vascular: No hyperdense vessel or unexpected calcification. Skull: No skull fracture.  No skull lesion. Sinuses/Orbits: No acute finding. Other: No scalp hematoma. IMPRESSION: 1. Significant left-sided subdural hemorrhage with mass effect and midline shift to the right of 7 mm. 2. No parenchymal hemorrhage. No other abnormality. No skull fracture. These results were called by telephone at the time of interpretation on 10/05/2015 at 9:31 am to Dr. Richardson Dopp , who verbally acknowledged these results. Electronically Signed   By: Lajean Manes M.D.   On: 10/05/2015 09:37   Dg Chest Port 1 View  Result Date: 10/05/2015 CLINICAL DATA:  Preop brain hemorrhage EXAM: PORTABLE CHEST 1 VIEW COMPARISON:  CT chest dated 01/19/2010 FINDINGS: Lungs are clear.  No pleural effusion or pneumothorax. The heart is normal in size. IMPRESSION: No evidence of acute cardiopulmonary disease. Electronically Signed   By: Julian Hy M.D.   On: 10/05/2015 11:25     Cardiac Studies   ECHO: 08/30/15 - Left ventricle: The cavity size was normal. Wall thickness was   normal. Systolic function was normal. The estimated ejection   fraction was in the range of 60% to 65%. Wall motion was normal;   there were no regional wall motion abnormalities. - Mitral valve: There was mild regurgitation.  Patient Profile     65 year old with left subdural hematoma, fall about 2 months ago, subsequent AFIB with  cardioversion - Eliquis - stopped.   Assessment & Plan    Subdural hematoma  - Eliquis stopped. Obviously will need to be off for a while. Will look to Dr. Arnoldo Morale for guidance on when to resume.   - CHADS2-VASc=2  Parox AFIB  - as above  - post cardioversion 09/14/15  - trying to maintain NSR. Doing well so far.   Hyponatremia  - per primary team.   Essential hypertension  - controlled on meds reviewed.   Tobacco use  - encourage cessation.   Please let us know if we can be of further assistance while he is here. Will sign off.   Signed, Candee Furbish, MD  10/06/2015, 10:17 AM

## 2015-10-06 NOTE — Progress Notes (Signed)
Patient ID: Kevin Leon, male   DOB: 12/16/1950, 65 y.o.   MRN: [id number] Subjective:  The patient is alert and pleasant. He is in no apparent distress. He continues to have a headache which is well controlled with Tylenol. He looks well.  Objective: Vital signs in last 24 hours: Temp:  [97.8 F (36.6 C)-98.5 F (36.9 C)] 98.3 F (36.8 C) (09/21 0748) Pulse Rate:  [60-83] 61 (09/21 0700) Resp:  [10-22] 12 (09/21 0700) BP: (116-180)/(59-88) 132/67 (09/21 0700) SpO2:  [96 %-100 %] 98 % (09/21 0700) Weight:  [70 kg (154 lb 5.2 oz)-72.9 kg (160 lb 12.8 oz)] 70 kg (154 lb 5.2 oz) (09/21 0358)  Intake/Output from previous day: 09/20 0701 - 09/21 0700 In: 525 [I.V.:525] Out: 550 [Urine:550] Intake/Output this shift: No intake/output data recorded.  Physical exam the patient is alert and oriented 3. Glasgow Coma Scale 15. His speech and strength is normal. His pupils are equal.  I have reviewed the patient's follow-up head CT performed today. It demonstrates he has a left subacute subdural hematoma with some midline shift. There is no significant change from his scan yesterday.  Lab Results:  Recent Labs  10/05/15 1050  WBC 7.9  HGB 14.6  HCT 42.2  PLT 226   BMET  Recent Labs  10/05/15 1050  NA 130*  K 4.3  CL 96*  CO2 26  GLUCOSE 99  BUN <5*  CREATININE 0.74  CALCIUM 9.3    Studies/Results: Ct Head Wo Contrast  Result Date: 10/06/2015 CLINICAL DATA:  Follow-up exam for subdural hematoma EXAM: CT HEAD WITHOUT CONTRAST TECHNIQUE: Contiguous axial images were obtained from the base of the skull through the vertex without intravenous contrast. COMPARISON:  Prior CT from 10/05/2015. FINDINGS: Brain: Left holo hemispheric subdural hematoma again seen. Hematoma measures up to 21 mm in maximal thickness on today's study. Mass effect on the subjacent left cerebral hemisphere, similar. Left-to-right shift is relatively similar measuring 8 mm on today's study. No  hydrocephalus or evidence for ventricular trapping. No other acute intracranial hemorrhage. No evidence for acute infarct. No mass lesion. Vascular: Mild scattered vascular calcifications within the carotid siphons. No hyperdense vessel. Skull: Scalp soft tissues within normal limits.  Calvarium intact. Sinuses/Orbits: Orbital soft tissues within normal limits. Globes intact. Paranasal sinuses are clear. No mastoid effusion. Other: No other significant finding. IMPRESSION: 1. Overall little interval change in size and appearance of acute left subdural hematoma with similar mass effect on the subjacent left cerebral hemisphere. Left-to-right shift measures 8 mm on today's study, previously 7 mm. 2. No other new acute intracranial process. Electronically Signed   By: Jeannine Boga M.D.   On: 10/06/2015 05:26   Ct Head Wo Contrast  Result Date: 10/05/2015 CLINICAL DATA:  [place]. No memory of incident. Blacked out and woke with blood on chin. On blood thinner. H/A's and transient memory loss. EXAM: CT HEAD WITHOUT CONTRAST TECHNIQUE: Contiguous axial images were obtained from the base of the skull through the vertex without intravenous contrast. COMPARISON:  07/27/2007 FINDINGS: Brain: There is a significant acute left-sided subdural hematoma, indenting the left frontal and parietal lobes, measuring 19 mm in greatest thickness. It causes midline shift to the right of 7 mm. No other intracranial hemorrhage. The ventricles are normal in size and configuration. There are no parenchymal masses or mass effect. There is no evidence of an infarct. Vascular: No hyperdense vessel or unexpected calcification. Skull: No skull fracture.  No skull lesion. Sinuses/Orbits: No acute  finding. Other: No scalp hematoma. IMPRESSION: 1. Significant left-sided subdural hemorrhage with mass effect and midline shift to the right of 7 mm. 2. No parenchymal hemorrhage. No other abnormality. No skull fracture. These results  were called by telephone at the time of interpretation on 10/05/2015 at 9:31 am to Dr. Richardson Dopp , who verbally acknowledged these results. Electronically Signed   By: Lajean Manes M.D.   On: 10/05/2015 09:37   Dg Chest Port 1 View  Result Date: 10/05/2015 CLINICAL DATA:  Preop brain hemorrhage EXAM: PORTABLE CHEST 1 VIEW COMPARISON:  CT chest dated 01/19/2010 FINDINGS: Lungs are clear.  No pleural effusion or pneumothorax. The heart is normal in size. IMPRESSION: No evidence of acute cardiopulmonary disease. Electronically Signed   By: Julian Hy M.D.   On: 10/05/2015 11:25    Assessment/Plan: Left subdural hematoma: I have discussed the situation with the patient. He is doing well clinically. We have stopped his Eliquis. I have recommended that we continue to observe him overnight. If he is doing well we can discharge him tomorrow and have him follow-up with me in the office in about a week for follow-up head CT. Hopefully the hematoma will resolve without intervention. If not it would be easier to remove after the blood further liquefies via bur holes versus doing a craniotomy now. He lives at home with his wife. The patient is agreeable. I have answered all his questions.  LOS: 1 day     Cary Lothrop D 10/06/2015, 7:54 AM

## 2015-10-07 NOTE — Progress Notes (Signed)
Pt being discharged home via wheelchair with family. Pt alert and oriented x4. VSS. Pt given tylenol and ativan prior to discharge. No signs of respiratory distress. Education complete and care plans resolved. IV removed with catheter intact and pt tolerated well. No further issues at this time. Pt to follow up with PCP. Leanne Chang, RN

## 2015-10-07 NOTE — Discharge Summary (Signed)
Physician Discharge Summary  Patient ID: Kevin Leon MRN: [id number] DOB/AGE: [birth year] 65 y.o.  Admit date: 10/05/2015 Discharge date: 10/07/2015  Admission Diagnoses:subdural hematoma  Discharge Diagnoses:  Active Problems:   Subdural hematoma [facility])   Discharged Condition: good  Hospital Course: Kevin Leon was admitted secondary to a subdural hematoma. He had some dysphagia and confusion which led to a head ct which revealed a subacute subdural hematoma on the left.  Treatments: observation, cessation of Eliquis  Discharge Exam: Blood pressure 127/66, pulse 72, temperature 98.5 F (36.9 C), temperature source Oral, resp. rate 17, height 6' (1.829 m), weight 71.6 kg (157 lb 14.4 oz), SpO2 96 %. General appearance: alert, cooperative, appears stated age and no distress Neurologic: Alert and oriented X 3, normal strength and tone. Normal symmetric reflexes. Normal coordination and gait  Disposition: 01-Home or Self Care Subdural Hematoma    Medication List    STOP taking these medications   apixaban 5 MG Tabs tablet Commonly known as:  ELIQUIS     TAKE these medications   carvedilol 6.25 MG tablet Commonly known as:  COREG TAKE 1 TABLET (6.25 MG TOTAL) BY MOUTH 2 (TWO) TIMES DAILY WITH A MEAL.   lisinopril 10 MG tablet Commonly known as:  PRINIVIL,ZESTRIL Take 1 tablet (10 mg total) by mouth daily.   LORazepam 0.5 MG tablet Commonly known as:  ATIVAN Take 0.5 mg by mouth every 6 (six) hours as needed for anxiety.      Follow-up Information    Ophelia Charter, MD .   Specialty:  Neurosurgery Why:  please call to make followup appointment one week from . Inform the staff you will need a noncontrast head ct for the day of your appointment Contact information: [address] [place] [address] [phone]           Signed: Winfield Cunas 10/07/2015, 12:15 PM

## 2015-10-07 NOTE — Care Management Note (Signed)
Case Management Note  Patient Details  Name: Kevin Leon MRN: [id number] Date of Birth: 1950/07/03  Subjective/Objective:                    Action/Plan: Pt discharging home with his spouse. No further needs per CM.   Expected Discharge Date:                  Expected Discharge Plan:  Home/Self Care  In-House Referral:     Discharge planning Services  CM Consult  Post Acute Care Choice:    Choice offered to:     DME Arranged:    DME Agency:     HH Arranged:    [place] Agency:     Status of Service:  Completed, signed off  If discussed at [employer] of Stay Meetings, dates discussed:    Additional Comments:  Pollie Friar, RN 10/07/2015, 12:44 PM

## 2015-10-12 ENCOUNTER — Other Ambulatory Visit: Payer: Self-pay | Admitting: Neurosurgery

## 2015-10-12 DIAGNOSIS — S065XAA Traumatic subdural hemorrhage with loss of consciousness status unknown, initial encounter: Secondary | ICD-10-CM

## 2015-10-12 DIAGNOSIS — S065X9A Traumatic subdural hemorrhage with loss of consciousness of unspecified duration, initial encounter: Secondary | ICD-10-CM

## 2015-10-14 ENCOUNTER — Ambulatory Visit
Admission: RE | Admit: 2015-10-14 | Discharge: 2015-10-14 | Disposition: A | Discharge status: Home / Self Care | Payer: Medicare Other | Source: Ambulatory Visit | Attending: Neurosurgery | Admitting: Neurosurgery

## 2015-10-14 DIAGNOSIS — S065XAA Traumatic subdural hemorrhage with loss of consciousness status unknown, initial encounter: Secondary | ICD-10-CM

## 2015-10-14 DIAGNOSIS — S065X9A Traumatic subdural hemorrhage with loss of consciousness of unspecified duration, initial encounter: Secondary | ICD-10-CM

## 2015-10-19 ENCOUNTER — Other Ambulatory Visit: Payer: Self-pay | Admitting: Neurosurgery

## 2015-10-19 DIAGNOSIS — S065XAA Traumatic subdural hemorrhage with loss of consciousness status unknown, initial encounter: Secondary | ICD-10-CM

## 2015-10-19 DIAGNOSIS — S065X9A Traumatic subdural hemorrhage with loss of consciousness of unspecified duration, initial encounter: Secondary | ICD-10-CM

## 2015-11-07 ENCOUNTER — Other Ambulatory Visit: Payer: Medicare Other

## 2015-11-14 ENCOUNTER — Ambulatory Visit
Admission: RE | Admit: 2015-11-14 | Discharge: 2015-11-14 | Disposition: A | Discharge status: Home / Self Care | Payer: Medicare Other | Source: Ambulatory Visit | Attending: Neurosurgery | Admitting: Neurosurgery

## 2015-11-14 ENCOUNTER — Inpatient Hospital Stay: Admission: RE | Admit: 2015-11-14 | Payer: Medicare Other | Source: Ambulatory Visit

## 2015-11-14 DIAGNOSIS — S065X9A Traumatic subdural hemorrhage with loss of consciousness of unspecified duration, initial encounter: Secondary | ICD-10-CM

## 2015-11-14 DIAGNOSIS — S065XAA Traumatic subdural hemorrhage with loss of consciousness status unknown, initial encounter: Secondary | ICD-10-CM

## 2015-12-23 ENCOUNTER — Other Ambulatory Visit: Payer: Self-pay | Admitting: Neurosurgery

## 2015-12-23 DIAGNOSIS — S065XAA Traumatic subdural hemorrhage with loss of consciousness status unknown, initial encounter: Secondary | ICD-10-CM

## 2015-12-23 DIAGNOSIS — S065X9A Traumatic subdural hemorrhage with loss of consciousness of unspecified duration, initial encounter: Secondary | ICD-10-CM

## 2015-12-26 ENCOUNTER — Ambulatory Visit
Admission: RE | Admit: 2015-12-26 | Discharge: 2015-12-26 | Disposition: A | Discharge status: Home / Self Care | Payer: Medicare Other | Source: Ambulatory Visit | Attending: Neurosurgery | Admitting: Neurosurgery

## 2015-12-26 ENCOUNTER — Other Ambulatory Visit: Payer: Medicare Other

## 2015-12-26 DIAGNOSIS — S065X9A Traumatic subdural hemorrhage with loss of consciousness of unspecified duration, initial encounter: Secondary | ICD-10-CM

## 2015-12-26 DIAGNOSIS — S065XAA Traumatic subdural hemorrhage with loss of consciousness status unknown, initial encounter: Secondary | ICD-10-CM

## 2016-07-17 ENCOUNTER — Encounter: Payer: Self-pay | Admitting: Physician Assistant

## 2016-07-31 ENCOUNTER — Ambulatory Visit: Payer: Medicare Other | Admitting: Physician Assistant

## 2016-08-15 ENCOUNTER — Encounter: Payer: Self-pay | Admitting: Physician Assistant

## 2016-08-15 ENCOUNTER — Other Ambulatory Visit: Payer: Self-pay | Admitting: Physician Assistant

## 2016-08-15 ENCOUNTER — Ambulatory Visit (INDEPENDENT_AMBULATORY_CARE_PROVIDER_SITE_OTHER): Payer: Medicare Other | Admitting: Physician Assistant

## 2016-08-15 VITALS — BP 150/80 | HR 60 | Ht 72.0 in | Wt 155.4 lb

## 2016-08-15 DIAGNOSIS — I10 Essential (primary) hypertension: Secondary | ICD-10-CM | POA: Diagnosis not present

## 2016-08-15 DIAGNOSIS — Z8679 Personal history of other diseases of the circulatory system: Secondary | ICD-10-CM | POA: Insufficient documentation

## 2016-08-15 DIAGNOSIS — I481 Persistent atrial fibrillation: Secondary | ICD-10-CM

## 2016-08-15 DIAGNOSIS — I4819 Other persistent atrial fibrillation: Secondary | ICD-10-CM

## 2016-08-15 DIAGNOSIS — Z720 Tobacco use: Secondary | ICD-10-CM

## 2016-08-15 HISTORY — DX: Personal history of other diseases of the circulatory system: Z86.79

## 2016-08-15 MED ORDER — LISINOPRIL 5 MG PO TABS: 5.0000 mg | ORAL_TABLET | Freq: Every day | ORAL | 3 refills | Status: DC

## 2016-08-15 MED ORDER — LISINOPRIL 10 MG PO TABS: 10.0000 mg | ORAL_TABLET | Freq: Every day | ORAL | 3 refills | Status: DC

## 2016-08-15 MED ORDER — CARVEDILOL 6.25 MG PO TABS: ORAL_TABLET | ORAL | 3 refills | Status: DC

## 2016-08-15 NOTE — Progress Notes (Signed)
Cardiology Office Note:    Date:  08/15/2016   ID:  CHE RACHAL, DOB 1950/11/10, MRN [id number]  PCP:  Vicenta Aly, [place]  Cardiologist:  Dr. Liam Rogers    Referring MD: Vicenta Aly, [place]   Chief Complaint  Patient presents with  . Atrial Fibrillation    Follow-up    History of Present Illness:    Kevin Leon is a 66 y.o. male with a hx of PAF, HTN, hepatitis C, tobacco abuse. Stress echo in 2010 with normal echo images.  He was dx with AFib in 08/2015.  CHADS2-VASc=2.  He was started on Apixaban for anticoagulation and underwent DCCV on 09/14/15 with restoration of NSR.  He was last seen in 09/2015 for follow up after his DCCV.  He complained of HAs and confusion and a stat CT in the office demonstrated a large L sided subdural hematoma.  He was admitted and followed by neurosurgery.  He was managed conservatively.  He did not require drainage.  His anticoagulation was stopped.   Kevin Leon returns for Cardiology follow up.  He is here alone. He is doing well overall.  He denies chest pain, shortness of breath, syncope, orthopnea, PND or significant pedal edema.  He had headaches for several months after his SDH.  These have largely resolved.  He does have high blood pressure when he comes in to the office. But, his BP at home averages 130s/60s.   He admits to increased anxiety when he comes in to the office.   Prior CV studies:   The following studies were reviewed today:  Echo 08/30/15 EF 60-65%, normal wall motion, mild MR   Past Medical History:  Diagnosis Date  . Anxiety   . Hepatitis C   . History of stress test    ETT-Echo in 2010 was normal  . HTN (hypertension)   . Persistent atrial fibrillation ([place]) 09/09/2015   CHADS2-VASc=2 //  Apixaban //  s/p DCCV 09/14/15  . Right rotator cuff tear 10/08/2014    Past Surgical History:  Procedure Laterality Date  . CARDIOVERSION [name] 09/14/2015   Procedure: CARDIOVERSION;  Surgeon: Thayer Headings, MD;  Location: [facility]  ENDOSCOPY;  Service: Cardiovascular;  Laterality: [name];  . large piece of wood removed from buttocks     as child  . SHOULDER ARTHROSCOPY WITH ROTATOR CUFF REPAIR Right 10/08/2014   Procedure: RIGHT SHOULDER ARTHROSCOPY, DEBRIDEMENT, ACROMIOPLASTY WITH ROTATOR CUFF REPAIR;  Surgeon: Marchia Bond, MD;  Location: [facility];  Service: Orthopedics;  Laterality: Right;  . TONSILLECTOMY      Current Medications: Current Meds  Medication Sig  . aspirin 81 MG tablet Take 81 mg by mouth daily.  . carvedilol (COREG) 6.25 MG tablet TAKE 1 TABLET (6.25 MG TOTAL) BY MOUTH 2 (TWO) TIMES DAILY WITH A MEAL.  Marland Kitchen lisinopril (PRINIVIL,ZESTRIL) 10 MG tablet Take 1 tablet (10 mg total) by mouth daily.  Marland Kitchen LORazepam (ATIVAN) 0.5 MG tablet Take 0.5 mg by mouth every 6 (six) hours as needed for anxiety.      Allergies:   No known allergies   Social History   Social History  . Marital status: Married    Spouse name: [name]  . Number of children: [name]  . Years of education: [name]   Social History Main Topics  . Smoking status: Current Every Day Smoker    Packs/day: 0.50    Types: Cigarettes    Last attempt to quit: 02/15/2014  . Smokeless tobacco: Never Used  .  Alcohol use 0.0 oz/week     Comment:  2 beers daily  . 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 Sexual activity: Yes   Other Topics Concern  . None   Social History Narrative  . None     Family Hx: The patient's family history includes Hypertension in his mother. There is no history of Heart attack.  ROS:   Please see the history of present illness.    ROS All other systems reviewed and are negative.   EKGs/Labs/Other Test Reviewed:    EKG:  EKG is  ordered today.  The ekg ordered today demonstrates NSR, HR 60, PACs, QTc 414 ms, no change from prior tracing  Recent Labs: 10/05/2015: ALT 13; BUN <5; Creatinine, Ser 0.74; Hemoglobin 14.6; Platelets 226; Potassium 4.3; Sodium 130   Recent Lipid  Panel Lab Results  Component Value Date/Time   CHOL 151 04/03/2013 08:40 AM   TRIG 19.0 04/03/2013 08:40 AM   HDL 84.50 04/03/2013 08:40 AM   CHOLHDL 2 04/03/2013 08:40 AM   LDLCALC 63 04/03/2013 08:40 AM    Physical Exam:    VS:  BP (!) 150/80   Pulse 60   Ht 6' (1.829 m)   Wt 155 lb 6.4 oz (70.5 kg)   BMI 21.08 kg/m     Wt Readings from Last 3 Encounters:  08/15/16 155 lb 6.4 oz (70.5 kg)  10/07/15 157 lb 14.4 oz (71.6 kg)  10/05/15 160 lb 12.8 oz (72.9 kg)     Physical Exam  Constitutional: He is oriented to person, place, and time. He appears well-developed and well-nourished. No distress.  HENT:  Head: Normocephalic and atraumatic.  Eyes: No scleral icterus.  Neck: Normal range of motion. No JVD present.  Cardiovascular: Normal rate, regular rhythm, S1 normal and S2 normal.   No murmur heard. Pulmonary/Chest: Effort normal and breath sounds normal. He has no wheezes. He has no rhonchi. He has no rales.  Abdominal: Soft. There is no hepatomegaly.  Musculoskeletal: He exhibits no edema.  Neurological: He is alert and oriented to person, place, and time.  Skin: Skin is warm and dry.  Psychiatric: He has a normal mood and affect.    ASSESSMENT:    1. Persistent atrial fibrillation ([place])   2. Essential hypertension   3. History of subdural hematoma   4. Tobacco abuse    PLAN:    In order of problems listed above:  1. Persistent atrial fibrillation ([place]) -  He underwent cardioversion last year with restoration of normal sinus rhythm. He then was noted to have a large subdural hematoma. He spent several days in the hospital but did not require drainage. He has been off of anticoagulation since that time. CHADS2-VASc=2 (age, HTN). Given his prior history of subdural hematoma, I do not think that we should attempt anticoagulation again.  Continue beta blocker.   2. Essential hypertension Blood pressure is elevated today. He does have "white coat hypertension."  Blood pressures at home are somewhat above target.  -  Increase lisinopril to 15 mg daily  -  Monitor BP x 2 weeks and send readings.  3. History of subdural hematoma Resolved. He has no further headaches.  4. Tobacco abuse He has tried to quit several times.   Dispo:  Return in about 1 year (around 08/15/2017) for Routine Follow Up, w/ Dr. Acie Fredrickson, or Richardson Dopp, PA-C.   Medication Adjustments/Labs and Tests Ordered: Current medicines  are reviewed at length with the patient today.  Concerns regarding medicines are outlined above.  Tests Ordered: Orders Placed This Encounter  Procedures  . EKG 12-Lead   Medication Changes: Meds ordered this encounter  Medications  . lisinopril (PRINIVIL,ZESTRIL) 5 MG tablet    Sig: Take 1 tablet (5 mg total) by mouth daily. Take 5 mg with 10 mg tab = 15 mg daily    Dispense:  90 tablet    Refill:  3  . carvedilol (COREG) 6.25 MG tablet    Sig: TAKE 1 TABLET (6.25 MG TOTAL) BY MOUTH 2 (TWO) TIMES DAILY WITH A MEAL.    Dispense:  180 tablet    Refill:  3    Signed, Richardson Dopp, PA-C  08/15/2016 4:35 PM    [facility] [place], [place], [place]  [address] Phone: [phone]; Fax: [phone]

## 2016-08-15 NOTE — Patient Instructions (Signed)
Medication Instructions:  1. INCREASE LISINOPRIL TO 15 MG DAILY: A NEW RX HAS BEEN SENT IN FOR LISINOPRIL 5 MG TABLET TO ADD TO YOUR 10 MG TABLE OF LISINOPRIL = 15 MG DAILY  Labwork: NONE ORDERED TODAY  Testing/Procedures: NONE ORDERED  Follow-Up: Your physician wants you to follow-up in: [place] DR. Acie Fredrickson You will receive a reminder letter in the mail two months in advance. If you don't receive a letter, please call our office to schedule the follow-up appointment.   Any Other Special Instructions Will Be Listed Below (If Applicable).  MONITOR BLOOD PRESSURE DAILY FOR 2 WEEKS; [place], [place]    If you need a refill on your cardiac medications before your next appointment, please call your pharmacy.

## 2016-08-17 ENCOUNTER — Telehealth: Payer: Self-pay | Admitting: Physician Assistant

## 2016-08-17 MED ORDER — LISINOPRIL 10 MG PO TABS: 10.0000 mg | ORAL_TABLET | Freq: Every day | ORAL | 3 refills | Status: DC

## 2016-08-17 MED ORDER — CARVEDILOL 6.25 MG PO TABS: ORAL_TABLET | ORAL | 3 refills | Status: DC

## 2016-08-17 MED ORDER — LISINOPRIL 5 MG PO TABS: 5.0000 mg | ORAL_TABLET | Freq: Every day | ORAL | 3 refills | Status: DC

## 2016-08-17 NOTE — Telephone Encounter (Signed)
Pt's medication was sent to the correct pharmacy as requested by pt. Confirmation received.

## 2016-08-22 ENCOUNTER — Other Ambulatory Visit: Payer: Self-pay | Admitting: Cardiovascular Disease

## 2016-08-22 NOTE — Telephone Encounter (Signed)
Medication Detail    Disp Refills Start End   carvedilol (COREG) 6.25 MG tablet 180 tablet 3 08/17/2016    Sig: TAKE 1 TABLET (6.25 MG TOTAL) BY MOUTH 2 (TWO) TIMES DAILY WITH A MEAL.   Sent to pharmacy as: carvedilol (COREG) 6.25 MG tablet   E-Prescribing Status: Receipt confirmed by pharmacy (08/17/2016 12:53 PM EDT)   Pharmacy   [id number] - [place], [place] - [address]

## 2016-08-30 ENCOUNTER — Telehealth: Payer: Self-pay | Admitting: Physician Assistant

## 2016-08-30 NOTE — Telephone Encounter (Signed)
New message    Pt is calling to report his BP. He asked for a call back.

## 2016-08-30 NOTE — Telephone Encounter (Signed)
I called pt back with recommendation per Brynda Rim. PA to remain on Lisinopril 15 mg daily and that BP is improving. Pt agreeable to plan of care. Pt thanked me for my call today.

## 2016-08-30 NOTE — Telephone Encounter (Signed)
BP improving. Continue with current treatment plan. Richardson Dopp, PA-C   08/30/2016 10:47 AM

## 2016-08-30 NOTE — Telephone Encounter (Signed)
Pt called in BP readings as requested per Richardson Dopp, PAC. Readings are as follows:  8/2: 142/62 hr 61; this was on lisinopril 10 mg  8/3: 125/58 hr 66; this was on lisinopril 10 mg  Starting 8/4 pt on lisinopril 15 mg daily as last

## 2016-08-30 NOTE — Telephone Encounter (Signed)
Pt called in BP readings as requested per Richardson Dopp, PAC. Readings are as follows: I will route to Richardson Dopp, PA for further review  8/2: 142/62 hr 61; this was on lisinopril 10 mg  8/3: 125/58 hr 66; this was on lisinopril 10 mg  Starting 8/4 pt on lisinopril 15 mg daily as instructed per last ov 8/4: 138/68 hr 66 8/5: 114/56 hr 63 8/6: 155/70 hr 59 8/7: 145/60 hr 67 8/8: 134/64 hr 62 8/9: 136/68 hr 65 8/10: 115/53 hr 65 8/11: 139/65 hr 64 8/12: 130/64 hr 64 8/13: 120/62 hr 70 8/14: 126/60 hr 65 8/15: 134/66 hr 66

## 2016-09-03 ENCOUNTER — Telehealth: Payer: Self-pay | Admitting: *Deleted

## 2016-09-03 NOTE — Telephone Encounter (Signed)
Agree Edessa Jakubowicz, PA-C    09/03/2016 4:56 PM   

## 2016-09-03 NOTE — Telephone Encounter (Signed)
Per request of Richardson Dopp, [place] I called the pt in regards to lab needing to be done. Pt saw [employer]. PA 8/1 with Lisinopril increased at that ov. Pt sent in BP readings 2 weeks following the increased dose of Lisinopril. Pt was advised on 8/16 after PA reviewed BP readings that he needs to have lab work ( bmet ) done. Pt said he wants to have this done with PCP which he states that is appt is in about 2 weeks. I explained to the pt that whenever there is a medication change it is important to make sure the kidneys are handling the change in the medication.  I did ask pt to please let the office know when he gets the lab work and to make sure labs are faxed to [facility] [phone]. Pt is agreeable to plan of care.

## 2016-09-03 NOTE — Telephone Encounter (Signed)
-----   Message from Liliane Shi, [place] sent at 09/03/2016 12:33 PM EDT ----- Please request recent BMET done with PCP. I increased his Lisinopril at last visit and he was to get a repeat BMET with his PCP at his annual physical. If the PCP did not get lab work since I last saw him a few weeks ago, please arrange a follow up BMET. Richardson Dopp, PA-C    09/03/2016 12:34 PM

## 2016-09-18 DIAGNOSIS — Z789 Other specified health status: Secondary | ICD-10-CM | POA: Insufficient documentation

## 2016-09-19 ENCOUNTER — Telehealth: Payer: Self-pay | Admitting: *Deleted

## 2016-09-19 NOTE — Telephone Encounter (Signed)
I tried to reach pt to see if he has had the lab work done that was discussed on call 09/03/16. See phone note from 09/03/16. Lmtcb.

## 2016-09-20 NOTE — Telephone Encounter (Signed)
New message     Pt is returning [place] call

## 2016-09-20 NOTE — Telephone Encounter (Signed)
[address] [place], [place]    09/20/2016 4:41 PM

## 2016-09-20 NOTE — Telephone Encounter (Signed)
Pt returned my call about his lab work if it had been yet. Pt said he mixed up his days when he was supposed to go to his PCP and thought it was the 2nd of this month though appt is not until 9/20. Pt said he will make sure results are sent to Richardson Dopp, PA. I again stated the importance of the lab work whenever there is any kind of change with medications, needing to make sure the kidneys are handling the medication changes. I told pt that I will Richardson Dopp, PAC know.

## 2017-02-06 ENCOUNTER — Other Ambulatory Visit: Payer: Self-pay | Admitting: Gastroenterology

## 2017-02-06 DIAGNOSIS — R1310 Dysphagia, unspecified: Secondary | ICD-10-CM

## 2017-02-12 ENCOUNTER — Ambulatory Visit
Admission: RE | Admit: 2017-02-12 | Discharge: 2017-02-12 | Disposition: A | Discharge status: Home / Self Care | Payer: Medicare Other | Source: Ambulatory Visit | Attending: Gastroenterology | Admitting: Gastroenterology

## 2017-02-12 DIAGNOSIS — R1310 Dysphagia, unspecified: Secondary | ICD-10-CM

## 2017-03-21 DIAGNOSIS — K5790 Diverticulosis of intestine, part unspecified, without perforation or abscess without bleeding: Secondary | ICD-10-CM | POA: Insufficient documentation

## 2017-04-04 DIAGNOSIS — K2900 Acute gastritis without bleeding: Secondary | ICD-10-CM | POA: Insufficient documentation

## 2017-08-07 ENCOUNTER — Telehealth: Payer: Self-pay | Admitting: Podiatry

## 2017-08-07 NOTE — Telephone Encounter (Signed)
I'm a pt of Dr. Mellody Drown and I'm having problems with my left ankle. I'm wondering if that is something Dr. Paulla Dolly can take care of or do I need to go somewhere else. If I can be seen by Dr. Paulla Dolly please call me at [phone]. Thank you.

## 2017-08-08 ENCOUNTER — Encounter: Payer: Self-pay | Admitting: Podiatry

## 2017-08-08 ENCOUNTER — Other Ambulatory Visit: Payer: Self-pay | Admitting: Podiatry

## 2017-08-08 ENCOUNTER — Ambulatory Visit (INDEPENDENT_AMBULATORY_CARE_PROVIDER_SITE_OTHER): Payer: Medicare Other | Admitting: Podiatry

## 2017-08-08 ENCOUNTER — Ambulatory Visit (INDEPENDENT_AMBULATORY_CARE_PROVIDER_SITE_OTHER): Payer: Medicare Other

## 2017-08-08 DIAGNOSIS — M779 Enthesopathy, unspecified: Secondary | ICD-10-CM | POA: Diagnosis not present

## 2017-08-08 DIAGNOSIS — M25572 Pain in left ankle and joints of left foot: Secondary | ICD-10-CM

## 2017-08-08 MED ORDER — TRIAMCINOLONE ACETONIDE 10 MG/ML IJ SUSP
10.0000 mg | Freq: Once | INTRAMUSCULAR | Status: AC
  Administered 2017-08-08: 10 mg

## 2017-08-09 NOTE — Progress Notes (Signed)
Subjective:   Patient ID: Kevin Leon, male   DOB: 67 y.o.   MRN: [id number]   HPI Patient states that the left ankle has really been bothering me for the last few months and he does not remember specific injury.  States it is worse when he tries to be active and has intensified over this period of time   ROS      Objective:  Physical Exam  Neurovascular status intact with patient noted to have inflammation pain of the sinus tarsi left extending in the lateral direction with +1 pitting edema and negative Homans sign noted.  It is also tender in the peroneal complex but mostly in the sinus tarsi     Assessment:  Inflammatory capsulitis sinus tarsi left with possibility for inflammatory changes also of the peroneal group     Plan:  H&P condition reviewed and injected the sinus tarsi left 3 mg Kenalog 5 mg Xylocaine and also into the flexor tendon group on that side.  Advised on ice therapy and compression and reappoint the next several weeks  X-ray indicates there is no indications of arthritis subtalar joint or stress fracture

## 2017-08-28 ENCOUNTER — Encounter: Payer: Self-pay | Admitting: Physician Assistant

## 2017-08-28 ENCOUNTER — Encounter: Payer: Self-pay | Admitting: Podiatry

## 2017-08-28 ENCOUNTER — Ambulatory Visit: Payer: Medicare Other | Admitting: Physician Assistant

## 2017-08-28 ENCOUNTER — Ambulatory Visit: Payer: Medicare Other | Admitting: Podiatry

## 2017-08-28 VITALS — BP 142/70 | HR 68 | Ht 72.0 in | Wt 151.0 lb

## 2017-08-28 DIAGNOSIS — I10 Essential (primary) hypertension: Secondary | ICD-10-CM | POA: Diagnosis not present

## 2017-08-28 DIAGNOSIS — I481 Persistent atrial fibrillation: Secondary | ICD-10-CM

## 2017-08-28 DIAGNOSIS — M779 Enthesopathy, unspecified: Secondary | ICD-10-CM | POA: Diagnosis not present

## 2017-08-28 DIAGNOSIS — I4819 Other persistent atrial fibrillation: Secondary | ICD-10-CM

## 2017-08-28 DIAGNOSIS — Z720 Tobacco use: Secondary | ICD-10-CM

## 2017-08-28 DIAGNOSIS — F17210 Nicotine dependence, cigarettes, uncomplicated: Secondary | ICD-10-CM

## 2017-08-28 MED ORDER — CARVEDILOL 6.25 MG PO TABS: ORAL_TABLET | ORAL | 3 refills | Status: DC

## 2017-08-28 MED ORDER — LISINOPRIL 10 MG PO TABS: 10.0000 mg | ORAL_TABLET | Freq: Every day | ORAL | 3 refills | Status: DC

## 2017-08-28 MED ORDER — LISINOPRIL 5 MG PO TABS: 5.0000 mg | ORAL_TABLET | Freq: Every day | ORAL | 3 refills | Status: DC

## 2017-08-28 NOTE — Progress Notes (Signed)
Cardiology Office Note:    Date:  08/28/2017   ID:  Kevin Leon, DOB [birth year], MRN [id number]  PCP:  Vicenta Aly, [place]  Cardiologist:  Mertie Moores, MD   Referring MD: Vicenta Aly, [place]   Chief Complaint  Patient presents with  . Follow-up    Atrial fibrillation, hypertension    History of Present Illness:    Kevin Leon is a 67 y.o. male with paroxysmal atrial fibrillation, hypertension, hepatitis C, tobacco abuse, prior subdural hematoma.  He underwent cardioversion in August 2017.  He developed a subdural hematoma in September 2017.  This was managed conservatively.  He was taken off of anticoagulation.  He was last seen in clinic in August 2018.  Kevin Leon returns for follow up.  He is here alone. He has not had chest pain, shortness of breath, syncope, palpitations.  He struggles with quitting smoking.    Prior CV studies:   The following studies were reviewed today:  Echo 08/30/15 EF 60-65%, normal wall motion, mild MR  Past Medical History:  Diagnosis Date  . Anxiety   . Essential hypertension 10/04/2015  . Hepatitis C   . History of stress test    ETT-Echo in 2010 was normal  . History of subdural hematoma 08/15/2016  . HTN (hypertension)   . Persistent atrial fibrillation ([place]) 09/09/2015   CHADS2-VASc=2 //  Apixaban //  s/p DCCV 09/14/15  . Right rotator cuff tear 10/08/2014  . Tobacco abuse 10/04/2015   Surgical Hx: The patient  has a past surgical history that includes Tonsillectomy; large piece of wood removed from buttocks; Shoulder arthroscopy with rotator cuff repair (Right, 10/08/2014); and Cardioversion ([name], 09/14/2015).   Current Medications: Current Meds  Medication Sig  . aspirin 81 MG tablet Take 81 mg by mouth daily.  . carvedilol (COREG) 6.25 MG tablet TAKE 1 TABLET (6.25 MG TOTAL) BY MOUTH 2 (TWO) TIMES DAILY WITH A MEAL.  Marland Kitchen ketoconazole (NIZORAL) 2 % cream Apply 1 application topically as needed.   Marland Kitchen lisinopril  (PRINIVIL,ZESTRIL) 5 MG tablet Take 1 tablet (5 mg total) by mouth daily. Take 5 mg with 10 mg tab = 15 mg daily  . LORazepam (ATIVAN) 0.5 MG tablet Take 0.5 mg by mouth every 6 (six) hours as needed for anxiety.   . [DISCONTINUED] carvedilol (COREG) 6.25 MG tablet TAKE 1 TABLET (6.25 MG TOTAL) BY MOUTH 2 (TWO) TIMES DAILY WITH A MEAL.  . [DISCONTINUED] lisinopril (PRINIVIL,ZESTRIL) 5 MG tablet Take 1 tablet (5 mg total) by mouth daily. Take 5 mg with 10 mg tab = 15 mg daily     Allergies:   No known allergies   Social History   Tobacco Use  . Smoking status: Current Every Day Smoker    Packs/day: 0.50    Types: Cigarettes    Last attempt to quit: 02/15/2014    Years since quitting: 3.5  . Smokeless tobacco: Never Used  Substance Use Topics  . Alcohol use: Yes    Alcohol/week: 0.0 standard drinks    Comment:  2 beers daily  . 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 There is no history of Heart attack.  ROS:   Please see the history of present illness.    ROS All other systems reviewed and are negative.   EKGs/Labs/Other Test Reviewed:    EKG:  EKG is  ordered today.  The ekg ordered today demonstrates normal sinus rhythm, heart rate 68, normal axis, QTC 425  Recent Labs: No results found for requested labs within last 8760 hours.   Recent Lipid Panel Lab Results  Component Value Date/Time   CHOL 151 04/03/2013 08:40 AM   TRIG 19.0 04/03/2013 08:40 AM   HDL 84.50 04/03/2013 08:40 AM   CHOLHDL 2 04/03/2013 08:40 AM   LDLCALC 63 04/03/2013 08:40 AM    Physical Exam:    VS:  BP (!) 142/70   Pulse 68   Ht 6' (1.829 m)   Wt 151 lb (68.5 kg)   SpO2 97%   BMI 20.48 kg/m     Wt Readings from Last 3 Encounters:  08/28/17 151 lb (68.5 kg)  08/15/16 155 lb 6.4 oz (70.5 kg)  10/07/15 157 lb 14.4 oz (71.6 kg)     Physical Exam  Constitutional: He  is oriented to person, place, and time. He appears well-developed and well-nourished. No distress.  HENT:  Head: Normocephalic and atraumatic.  Neck: No JVD present. Carotid bruit is not present.  Cardiovascular: Normal rate and regular rhythm.  No murmur heard. Pulmonary/Chest: Effort normal. He has no rales.  Abdominal: Soft. There is no tenderness.  Musculoskeletal: He exhibits no edema.  Neurological: He is alert and oriented to person, place, and time.  Skin: Skin is warm and dry.    ASSESSMENT & PLAN:    Persistent atrial fibrillation (HCC)  Maintaining normal sinus rhythm.  He is no longer on anticoagulation due to history of subdural hematoma.  Essential hypertension Blood pressure at home is optimal.  He usually has high blood pressures in clinic.  Continue current medical regimen.  Tobacco abuse We discussed the importance of tobacco cessation and different strategies for quitting.  Continued tobacco abuse significantly impacts his diagnosis of hypertension.  We spent 5 minutes discussing pharmacotherapy including Chantix and Wellbutrin.  He has used nicotine patches in the past.  I suggested that he try remaining on nicotine patches longer after he quits to see if this helps.   Dispo:  Return in about 1 year (around 08/29/2018) for Routine Follow Up, w/ Dr. Acie Fredrickson, or Richardson Dopp, PA-C.   Medication Adjustments/Labs and Tests Ordered: Current medicines are reviewed at length with the patient today.  Concerns regarding medicines are outlined above.  Tests Ordered: Orders Placed This Encounter  Procedures  . EKG 12-Lead   Medication Changes: Meds ordered this encounter  Medications  . lisinopril (PRINIVIL,ZESTRIL) 10 MG tablet    Sig: Take 1 tablet (10 mg total) by mouth daily. (take with 5 mg tab to equal 15 mg)    Dispense:  90 tablet    Refill:  3  . lisinopril (PRINIVIL,ZESTRIL) 5 MG tablet    Sig: Take 1 tablet (5 mg total) by mouth daily. Take 5 mg with 10 mg  tab = 15 mg daily    Dispense:  90 tablet    Refill:  3  . carvedilol (COREG) 6.25 MG tablet    Sig: TAKE 1 TABLET (6.25 MG TOTAL) BY MOUTH 2 (TWO) TIMES DAILY WITH A MEAL.    Dispense:  180 tablet    Refill:  3    Signed, Richardson Dopp, PA-C  08/28/2017 2:14 PM    [facility] [place], [place], [place]  [address] Phone: [phone]; Fax: [phone]

## 2017-08-28 NOTE — Patient Instructions (Signed)
Medication Instructions:  1. REFILLS HAVE BEEN SENT IN FOR COREG AND LISINOPRIL  Labwork: NONE ORDERED TODAY  Testing/Procedures: NONE ORDERED TODAY  Follow-Up: DR. Acie Fredrickson IN 1 YEAR   Any Other Special Instructions Will Be Listed Below (If Applicable).     If you need a refill on your cardiac medications before your next appointment, please call your pharmacy.

## 2017-08-29 NOTE — Progress Notes (Signed)
Subjective:   Patient ID: Kevin Leon, male   DOB: 67 y.o.   MRN: [id number]   HPI Patient states that his ankle feels a lot better with minimal discomfort and is very pleased so far   ROS      Objective:  Physical Exam  Neurovascular status intact with significant diminishment of discomfort in the ankle left sinus tarsi with fluid still present but much improved     Assessment:  Dramatic improvement of sinus tarsitis left     Plan:  Advised on physical therapy supportive shoes and gradual increase in activity and patient will be seen back if symptoms were to recur again

## 2018-01-29 ENCOUNTER — Ambulatory Visit: Payer: Medicare Other | Admitting: Podiatry

## 2018-01-29 ENCOUNTER — Encounter: Payer: Self-pay | Admitting: Podiatry

## 2018-01-29 DIAGNOSIS — L84 Corns and callosities: Secondary | ICD-10-CM

## 2018-01-29 DIAGNOSIS — D169 Benign neoplasm of bone and articular cartilage, unspecified: Secondary | ICD-10-CM

## 2018-01-29 NOTE — Progress Notes (Signed)
Subjective:   Patient ID: Kevin Leon, male   DOB: 68 y.o.   MRN: [id number]   HPI Patient states getting a lot of pain in the inside of the fifth digit left and states he does not remember injury but it is been hurting him as it presses against his fourth toe   ROS      Objective:  Physical Exam  Neurovascular status intact with rotated fifth digit left with what appears to be an exostotic lesion with keratotic tissue distal medial aspect digit 5 left     Assessment:  Probable exostosis fifth digit left with keratotic lesion     Plan:  H&P reviewed condition and today sterile debridement of the lesion was accomplished along with padding therapy and cushion.  Discussed exostosis and the possibility for exostectomy which may be necessary

## 2018-02-17 ENCOUNTER — Telehealth: Payer: Self-pay | Admitting: Podiatry

## 2018-02-17 NOTE — Telephone Encounter (Signed)
I called pt and he stated he saw Dr. Paulla Dolly and had a callous in between the toes and is painful again. Pt states Dr. Paulla Dolly offered surgery and pt wanted to know what that entailed. I told pt that right after the surgery he would be in a surgical shoe and surgical dressing that would stay in place for 1 week, then after the surgery check he may be in a light surgical dressing and the surgery boot, and able to be up about 15 minutes/hr to go to the bathroom, and fix a light snack, but otherwise he would need to rest and elevate. 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 I told pt that would be a problem, because the area has to be kept clean and dry. Pt states he will discuss with his wife and then Dr. Paulla Dolly on Monday.

## 2018-02-17 NOTE — Telephone Encounter (Signed)
I had a little procedure on 15 January and he told me I may need surgery. I wanted to speak to someone about the ramifications about that and if I would be out.

## 2018-02-17 NOTE — Telephone Encounter (Signed)
"  I left a message this morning and no one has called me back.  Dr. Paulla Dolly had mentioned doing surgery on my toe if it didn't get any better.  It hasn't gotten better.  So I'd like to discuss the ramifications of me having surgery.  He had discussed doing a 15 minute procedure in the office."  You will need an appointment to see Dr. Paulla Dolly for a consultation.  During the consultation he will explain your surgery in detail.  Then we can get you scheduled for your surgery.  Would you like me to transfer you to an appointment scheduler.  "Yes, that will be fine."  If they don't answer, leave them a message and someone will give you a call back.  I transferred the call to [place].

## 2018-02-17 NOTE — Telephone Encounter (Signed)
Pt called and scheduled an appointment to see Dr. Paulla Dolly next Monday but would still like to speak to the nurse.

## 2018-02-24 ENCOUNTER — Ambulatory Visit (INDEPENDENT_AMBULATORY_CARE_PROVIDER_SITE_OTHER): Payer: Medicare Other

## 2018-02-24 ENCOUNTER — Encounter: Payer: Self-pay | Admitting: Podiatry

## 2018-02-24 ENCOUNTER — Ambulatory Visit: Payer: Medicare Other | Admitting: Podiatry

## 2018-02-24 ENCOUNTER — Other Ambulatory Visit: Payer: Self-pay | Admitting: Podiatry

## 2018-02-24 DIAGNOSIS — M898X9 Other specified disorders of bone, unspecified site: Secondary | ICD-10-CM | POA: Diagnosis not present

## 2018-02-24 DIAGNOSIS — M79672 Pain in left foot: Secondary | ICD-10-CM | POA: Diagnosis not present

## 2018-02-24 DIAGNOSIS — L84 Corns and callosities: Secondary | ICD-10-CM

## 2018-02-24 DIAGNOSIS — D169 Benign neoplasm of bone and articular cartilage, unspecified: Secondary | ICD-10-CM

## 2018-02-24 NOTE — Progress Notes (Signed)
Subjective:   Patient ID: Kevin Leon, male   DOB: 68 y.o.   MRN: [id number]   HPI Patient presents stating he wants to get this fixed as he only got short relief after the last treatment that we did   ROS      Objective:  Physical Exam  Patient was noted to have a distal medial keratotic lesion digit 5 left that is very painful when pressed and is making shoe gear difficult and was noted to have rotation of the fifth toe with pain with palpation    Assessment:  Distal medial keratotic lesion digit 5 left with pain     Plan:  H&P condition reviewed and recommended exostectomy and explained procedure to patient along with risk patient.  Patient wants surgery understanding this procedure and the fact there is no long-term guarantees and that reoccurrence could occur and ultimately may require a different type of procedure.  He is willing to accept risk and at this time after extensive review signed consent form and is scheduled for outpatient surgery at the current time.  Patient is encouraged to call with questions concerns.  Debrided the lesion today with no iatrogenic bleeding to try to get short-term relief and scheduled for his procedure  X-ray indicates that there is satisfactory spur formation with no no changes from previous visit which was conveyed to the patient

## 2018-03-03 ENCOUNTER — Telehealth: Payer: Self-pay | Admitting: *Deleted

## 2018-03-03 NOTE — Telephone Encounter (Signed)
"  I'm supposed to be scheduled for surgery with Dr. Paulla Dolly.  My notes indicate March 10 at 7:30 am.  Please call me back."  I am returning your call.  "I was just calling to confirm my surgery date."  We have you scheduled for March 4.  "Okay, March 4 with an arrival time of 7:30 am, correct?"  Your arrival time is 7:45 am.

## 2018-03-19 ENCOUNTER — Ambulatory Visit (INDEPENDENT_AMBULATORY_CARE_PROVIDER_SITE_OTHER): Payer: Medicare Other | Admitting: Podiatry

## 2018-03-19 ENCOUNTER — Encounter: Payer: Self-pay | Admitting: Podiatry

## 2018-03-19 VITALS — BP 170/80 | HR 58 | Resp 16

## 2018-03-19 DIAGNOSIS — D169 Benign neoplasm of bone and articular cartilage, unspecified: Secondary | ICD-10-CM

## 2018-03-19 NOTE — Progress Notes (Signed)
Subjective:   Patient ID: Kevin Leon, male   DOB: 68 y.o.   MRN: [id number]   HPI Patient presents to have the fifth toe fixed left foot states it is been very tender and he cannot trim it anymore   ROS      Objective:  Physical Exam  Neurovascular status intact with patient's left fifth digit showing keratotic lesion on the inside of the toe with good digital perfusion toe warm to touch     Assessment:  Chronic exostotic lesion digit 5 left     Plan:  Recommended correction and at this point I infiltrated the left fifth digit 60 mg like Marcaine mixture and take the patient to the OR and sterile prep was applied to the toe and I then exsanguinated with Ace wrap and inflated tourniquet to 250 mmHg.  Following procedure was performed attention was directed to the medial aspect digit 5 left where a semielliptical incision was made centered over the exostotic and keratotic lesion.  It was taken to bone and the intervening skin wedge was removed in toto.  I then went ahead and exposed the bone on the fifth digit and utilizing a sidecutting rasp I smooth all rough and bone surfaces and then flushed out the bone paste.  I found to be satisfactorily removed and I then went ahead and sutured the tissue with 5-0 nylon and sterile dressing was applied to the left foot.  Tourniquet deflated capillary fill noted be immediate to all digits on the toe and the patient left the OR in satisfactory condition with surgical shoe

## 2018-03-20 ENCOUNTER — Telehealth: Payer: Self-pay | Admitting: *Deleted

## 2018-03-20 NOTE — Telephone Encounter (Signed)
Pt states he had surgery on his foot and he wanted to know how long the dressing was to stay in place.

## 2018-03-20 NOTE — Telephone Encounter (Signed)
He can take it off in next few day if it feels good. Earliest would be sunday

## 2018-03-20 NOTE — Telephone Encounter (Signed)
I informed pt he could remove the dressing Sunday and cover with a bandaid. Pt thanked me for the call back and stated understanding.

## 2018-04-02 ENCOUNTER — Other Ambulatory Visit: Payer: Self-pay

## 2018-04-02 ENCOUNTER — Ambulatory Visit (INDEPENDENT_AMBULATORY_CARE_PROVIDER_SITE_OTHER): Payer: Medicare Other | Admitting: Podiatry

## 2018-04-02 ENCOUNTER — Ambulatory Visit (INDEPENDENT_AMBULATORY_CARE_PROVIDER_SITE_OTHER): Payer: Medicare Other

## 2018-04-02 ENCOUNTER — Encounter: Payer: Self-pay | Admitting: Podiatry

## 2018-04-02 VITALS — Temp 97.1°F

## 2018-04-02 DIAGNOSIS — D169 Benign neoplasm of bone and articular cartilage, unspecified: Secondary | ICD-10-CM

## 2018-04-02 DIAGNOSIS — Z09 Encounter for follow-up examination after completed treatment for conditions other than malignant neoplasm: Secondary | ICD-10-CM

## 2018-04-06 NOTE — Progress Notes (Signed)
Subjective:   Patient ID: Kevin Leon, male   DOB: 69 y.o.   MRN: [id number]   HPI Patient states he is doing great after surgery with minimal discomfort   ROS      Objective:  Physical Exam  Neurovascular status intact negative Homans sign noted patient's fifth digit left is healing well wound edges well coapted digit in good alignment     Assessment:  Doing well post exostectomy digit 5 left     Plan:  X-ray and H&P reviewed and remove stitches with wound edges well coapted and applied sterile dressing with instructions on continued open toed shoes and reappoint to recheck  X-ray indicates that there is good alignment of the toe with satisfactory resection of bone

## 2018-08-15 ENCOUNTER — Other Ambulatory Visit: Payer: Self-pay | Admitting: Physician Assistant

## 2018-09-02 ENCOUNTER — Other Ambulatory Visit: Payer: Self-pay

## 2018-09-02 ENCOUNTER — Encounter: Payer: Self-pay | Admitting: Physician Assistant

## 2018-09-02 ENCOUNTER — Encounter (INDEPENDENT_AMBULATORY_CARE_PROVIDER_SITE_OTHER): Payer: Self-pay

## 2018-09-02 ENCOUNTER — Ambulatory Visit (INDEPENDENT_AMBULATORY_CARE_PROVIDER_SITE_OTHER): Payer: Medicare Other | Admitting: Physician Assistant

## 2018-09-02 VITALS — BP 168/70 | HR 62 | Ht 72.0 in | Wt 160.4 lb

## 2018-09-02 DIAGNOSIS — I10 Essential (primary) hypertension: Secondary | ICD-10-CM | POA: Diagnosis not present

## 2018-09-02 DIAGNOSIS — E871 Hypo-osmolality and hyponatremia: Secondary | ICD-10-CM

## 2018-09-02 DIAGNOSIS — I480 Paroxysmal atrial fibrillation: Secondary | ICD-10-CM

## 2018-09-02 MED ORDER — LISINOPRIL 10 MG PO TABS: 10.0000 mg | ORAL_TABLET | Freq: Every day | ORAL | 3 refills | Status: DC

## 2018-09-02 MED ORDER — CARVEDILOL 6.25 MG PO TABS: ORAL_TABLET | ORAL | 0 refills | Status: DC

## 2018-09-02 NOTE — Patient Instructions (Signed)
Medication Instructions:  You may stop taking aspirin  If you need a refill on your cardiac medications before your next appointment, please call your pharmacy.   Lab work: None   If you have labs (blood work) drawn today and your tests are completely normal, you will receive your results only by: Marland Kitchen MyChart Message (if you have MyChart) OR . A paper copy in the mail If you have any lab test that is abnormal or we need to change your treatment, we will call you to review the results.  Testing/Procedures: None   Follow-Up: At [facility], you and your health needs are our priority.  As part of our continuing mission to provide you with exceptional heart care, we have created designated Provider Care Teams.  These Care Teams include your primary Cardiologist (physician) and Advanced Practice Providers (APPs -  Physician Assistants and Nurse Practitioners) who all work together to provide you with the care you need, when you need it. You will need a follow up appointment in:  12 months.  Please call our office 2 months in advance to schedule this appointment.  You may see Mertie Moores, MD or Richardson Dopp, PA-C   Any Other Special Instructions Will Be Listed Below (If Applicable). Check your blood pressure twice a day for 2 weeks and send me those readings. Try to reduce your consumption of beer to improve your sodium level.  Use caution in drinking Gatorade as the salt can increase your blood pressure some.

## 2018-09-02 NOTE — Progress Notes (Signed)
Cardiology Office Note:    Date:  09/02/2018   ID:  Kevin Leon, DOB 1950-06-04, MRN [id number]  PCP:  Kevin Leon, [place]  Cardiologist:  Kevin Moores, MD  Electrophysiologist:  None   Referring MD: Kevin Leon, [place]   Chief Complaint  Patient presents with  . Follow-up    Atrial fibrillation, hypertension    History of Present Illness:    Kevin Leon is a 68 y.o. male with:  Paroxysmal atrial fibrillation  Prior subdural hematoma >> no longer on anticoagulation  Hypertension  Hepatitis C  Tobacco abuse   [name] was last seen in August 2019.  He returns for follow-up.  He is here alone.  Since last seen, he has been doing well.  He has not had chest pain, shortness of breath or syncope.  He has had some headaches ever since his subdural hematoma.  These are overall stable.  He also notes a lot of sinus congestion.  His sodium was recently low and his PCP asked him to drink a Gatorade every day.  Prior CV studies:   The following studies were reviewed today:  Echo 08/30/15 EF 60-65%, normal wall motion, mild MR  Past Medical History:  Diagnosis Date  . Anxiety   . Essential hypertension 10/04/2015  . Hepatitis C   . History of stress test    ETT-Echo in 2010 was normal  . History of subdural hematoma 08/15/2016  . HTN (hypertension)   . Persistent atrial fibrillation 09/09/2015   CHADS2-VASc=2 //  Apixaban //  s/p DCCV 09/14/15  . Right rotator cuff tear 10/08/2014  . Tobacco abuse 10/04/2015   Surgical Hx: The patient  has a past surgical history that includes Tonsillectomy; large piece of wood removed from buttocks; Shoulder arthroscopy with rotator cuff repair (Right, 10/08/2014); and Cardioversion ([name], 09/14/2015).   Current Medications: Current Meds  Medication Sig  . aspirin 81 MG tablet Take 81 mg by mouth daily.  . carvedilol (COREG) 6.25 MG tablet TAKE 1 TABLET (6.25 MG TOTAL) BY MOUTH 2 (TWO) TIMES DAILY WITH A MEAL.  Marland Kitchen ketoconazole  (NIZORAL) 2 % cream Apply 1 application topically as needed.   Marland Kitchen lisinopril (ZESTRIL) 10 MG tablet Take 1 tablet (10 mg total) by mouth daily.  Marland Kitchen LORazepam (ATIVAN) 0.5 MG tablet Take 0.5 mg by mouth every 6 (six) hours as needed for anxiety.   . [DISCONTINUED] carvedilol (COREG) 6.25 MG tablet TAKE 1 TABLET (6.25 MG TOTAL) BY MOUTH 2 (TWO) TIMES DAILY WITH A MEAL. Please keep upcoming appt in August. Thanks  . [DISCONTINUED] lisinopril (ZESTRIL) 10 MG tablet Take 1 tablet (10 mg total) by mouth daily. (take with 5 mg tab to equal 15 mg) Please keep upcoming appt in August. Thanks     Allergies:   No known allergies   Social History   Tobacco Use  . Smoking status: Current Every Day Smoker    Packs/day: 0.50    Types: Cigarettes    Last attempt to quit: 02/15/2014    Years since quitting: 4.5  . Smokeless tobacco: Never Used  Substance Use Topics  . Alcohol use: Yes    Alcohol/week: 0.0 standard drinks    Comment:  2 beers daily  . 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 There is no history of Heart attack.  ROS:  Please see the history of present illness.    ROS All other systems reviewed and are negative.   EKGs/Labs/Other Test Reviewed:    EKG:  EKG is  ordered today.  The ekg ordered today demonstrates normal sinus rhythm, heart rate 62, normal axis, QTC 408, no change from prior tracing  Recent Labs: No results found for requested labs within last 8760 hours.   Recent Lipid Panel Lab Results  Component Value Date/Time   CHOL 151 04/03/2013 08:40 AM   TRIG 19.0 04/03/2013 08:40 AM   HDL 84.50 04/03/2013 08:40 AM   CHOLHDL 2 04/03/2013 08:40 AM   LDLCALC 63 04/03/2013 08:40 AM         Physical Exam:    VS:  BP (!) 168/70   Pulse 62   Ht 6' (1.829 m)   Wt 160 lb 6.4 oz (72.8 kg)   SpO2 98%   BMI 21.75 kg/m     Wt Readings from Last  3 Encounters:  09/02/18 160 lb 6.4 oz (72.8 kg)  08/28/17 151 lb (68.5 kg)  08/15/16 155 lb 6.4 oz (70.5 kg)     Physical Exam  Constitutional: He is oriented to person, place, and time. He appears well-developed and well-nourished. No distress.  HENT:  Head: Normocephalic and atraumatic.  Neck: Neck supple. No JVD present.  Cardiovascular: Normal rate, regular rhythm, S1 normal and S2 normal.  No murmur heard. Pulmonary/Chest: Breath sounds normal. He has no rales.  Abdominal: Soft. There is no hepatomegaly.  Musculoskeletal:        General: No edema.  Neurological: He is alert and oriented to person, place, and time.  Skin: Skin is warm and dry.    ASSESSMENT & PLAN:    1. PAF (paroxysmal atrial fibrillation) (HCC) Maintaining sinus rhythm.  He is no longer on anticoagulation due to history of subdural hematoma.  He inquired about aspirin therapy.  There is no benefit to taking aspirin in terms of stroke prevention related to atrial fibrillation.  Also recent trial data has seemed to suggest that there is no benefit to taking aspirin for primary prevention in patients in his risk group.  Therefore, he can certainly stop taking aspirin if he so desires.  2. Essential hypertension Blood pressure is elevated.  He has had better blood pressure readings at home.  I have asked him to cut back on the Gatorade.  I have also asked him to check his blood pressure twice a day for 2 weeks and send me those readings.   If his BP remains above target, increase lisinopril to 10 mg twice daily.    3. Hyponatremia I suspect that this is mainly related to beer consumption.  He has had chronic hyponatremia for several years now.  We discussed reducing his beer consumption to help improve this.   Dispo:  Return in about 1 year (around 09/02/2019) for Routine Follow Up, w/ Dr. Acie Fredrickson, or Richardson Dopp, PA-C.   Medication Adjustments/Labs and Tests Ordered: Current medicines are reviewed at length with  the patient today.  Concerns regarding medicines are outlined above.  Tests Ordered: Orders Placed This Encounter  Procedures  . EKG 12-Lead   Medication Changes: Meds ordered this encounter  Medications  . carvedilol (COREG) 6.25 MG tablet    Sig: TAKE 1 TABLET (6.25 MG TOTAL) BY MOUTH 2 (TWO) TIMES DAILY WITH A MEAL.    Dispense:  180 tablet    Refill:  0  . lisinopril (ZESTRIL) 10 MG tablet  Sig: Take 1 tablet (10 mg total) by mouth daily.    Dispense:  90 tablet    Refill:  3    Signed, Richardson Dopp, PA-C  09/02/2018 3:02 PM    [facility] [place], [place], [place]  [address] Phone: [phone]; Fax: [phone]

## 2018-09-04 ENCOUNTER — Other Ambulatory Visit: Payer: Self-pay | Admitting: Physician Assistant

## 2018-09-08 ENCOUNTER — Ambulatory Visit: Payer: Medicare Other | Admitting: Podiatry

## 2018-09-08 ENCOUNTER — Encounter: Payer: Self-pay | Admitting: Podiatry

## 2018-09-08 ENCOUNTER — Ambulatory Visit (INDEPENDENT_AMBULATORY_CARE_PROVIDER_SITE_OTHER): Payer: Medicare Other

## 2018-09-08 ENCOUNTER — Other Ambulatory Visit: Payer: Self-pay

## 2018-09-08 ENCOUNTER — Other Ambulatory Visit: Payer: Self-pay | Admitting: Podiatry

## 2018-09-08 VITALS — Temp 97.8°F

## 2018-09-08 DIAGNOSIS — M779 Enthesopathy, unspecified: Secondary | ICD-10-CM

## 2018-09-08 DIAGNOSIS — D169 Benign neoplasm of bone and articular cartilage, unspecified: Secondary | ICD-10-CM

## 2018-09-08 NOTE — Progress Notes (Signed)
Subjective:   Patient ID: Kevin Leon, male   DOB: 68 y.o.   MRN: [id number]   HPI Patient presents with a lot of pain in the second toe joint stating he does not remember specific injury but it is been really bothering him recently   ROS      Objective:  Physical Exam  Neurovascular status intact with inflammatory capsulitis second MPJ left with fluid buildup and pain with palpation     Assessment:  Inflammatory capsulitis second MPJ left with pain     Plan:  H&P condition reviewed and recommended conservative treatment and did proximal nerve block aspirated the joint getting out a small amount of clear fluid and injected quarter cc dexamethasone Kenalog and advised on rigid bottom shoes.  Reappoint if symptoms indicate  X-rays indicate that there is no signs of stress fracture or arthritic process present in the second MPJ signed visit

## 2018-10-03 ENCOUNTER — Other Ambulatory Visit: Payer: Self-pay | Admitting: Physician Assistant

## 2018-10-03 NOTE — Telephone Encounter (Signed)
Pt's pharmacy and pt calling requesting a refill on lisinopril 5 mg tablet. Pt stated that he is supposed to be taking lisinopril 10 mg and lisinopril 5 mg tablets, to equal lisinopril 15 mg. This medication was D/C and taken off of pt medication list. Would Richardson Dopp, PA like to reorder this medication? Please address

## 2018-11-25 ENCOUNTER — Other Ambulatory Visit: Payer: Self-pay

## 2018-11-25 ENCOUNTER — Emergency Department (HOSPITAL_COMMUNITY)
Admission: EM | Admit: 2018-11-25 | Discharge: 2018-11-26 | Discharge status: Against Medical Advice | Payer: Medicare Other | Attending: Emergency Medicine | Admitting: Emergency Medicine

## 2018-11-25 ENCOUNTER — Encounter (HOSPITAL_COMMUNITY): Payer: Self-pay | Admitting: Emergency Medicine

## 2018-11-25 DIAGNOSIS — Z5321 Procedure and treatment not carried out due to patient leaving prior to being seen by health care provider: Secondary | ICD-10-CM | POA: Insufficient documentation

## 2018-11-25 DIAGNOSIS — R42 Dizziness and giddiness: Secondary | ICD-10-CM | POA: Diagnosis present

## 2018-11-25 LAB — CBC
HCT: 40.8 % (ref 39.0–52.0)
Hemoglobin: 14 g/dL (ref 13.0–17.0)
MCH: 32.4 pg (ref 26.0–34.0)
MCHC: 34.3 g/dL (ref 30.0–36.0)
MCV: 94.4 fL (ref 80.0–100.0)
Platelets: 235 10*3/uL (ref 150–400)
RBC: 4.32 MIL/uL (ref 4.22–5.81)
RDW: 11.7 % (ref 11.5–15.5)
WBC: 5.2 10*3/uL (ref 4.0–10.5)
nRBC: 0 % (ref 0.0–0.2)

## 2018-11-25 LAB — TROPONIN I (HIGH SENSITIVITY)
Troponin I (High Sensitivity): 5 ng/L (ref <18)
Troponin I (High Sensitivity): 5 ng/L (ref <18)

## 2018-11-25 LAB — BASIC METABOLIC PANEL
Anion gap: 8 (ref 5–15)
BUN: 11 mg/dL (ref 8–23)
CO2: 25 mmol/L (ref 22–32)
Calcium: 9 mg/dL (ref 8.9–10.3)
Chloride: 97 mmol/L — ABNORMAL LOW (ref 98–111)
Creatinine, Ser: 0.89 mg/dL (ref 0.61–1.24)
GFR calc Af Amer: >60 mL/min (ref >60)
GFR calc non Af Amer: >60 mL/min (ref >60)
Glucose, Bld: 112 mg/dL — ABNORMAL HIGH (ref 70–99)
Potassium: 4.1 mmol/L (ref 3.5–5.1)
Sodium: 130 mmol/L — ABNORMAL LOW (ref 135–145)

## 2018-11-25 NOTE — ED Notes (Signed)
Pt states he is not waiting here another 5 hours and is going to call for a ride home

## 2018-11-25 NOTE — ED Triage Notes (Signed)
Pt here from home with c/o low Na that has been ongoing for a while pt states he has been dizzy and some numbness in his arms over the last 48 hrs

## 2018-11-26 ENCOUNTER — Other Ambulatory Visit: Payer: Self-pay

## 2018-11-26 ENCOUNTER — Encounter (HOSPITAL_COMMUNITY): Payer: Self-pay | Admitting: Emergency Medicine

## 2018-11-26 ENCOUNTER — Emergency Department (HOSPITAL_COMMUNITY)
Admission: EM | Admit: 2018-11-26 | Discharge: 2018-11-26 | Disposition: A | Discharge status: Home / Self Care | Payer: Medicare Other | Source: Home / Self Care | Attending: Emergency Medicine | Admitting: Emergency Medicine

## 2018-11-26 DIAGNOSIS — E871 Hypo-osmolality and hyponatremia: Secondary | ICD-10-CM

## 2018-11-26 DIAGNOSIS — Z7982 Long term (current) use of aspirin: Secondary | ICD-10-CM | POA: Insufficient documentation

## 2018-11-26 DIAGNOSIS — I10 Essential (primary) hypertension: Secondary | ICD-10-CM | POA: Insufficient documentation

## 2018-11-26 DIAGNOSIS — R202 Paresthesia of skin: Secondary | ICD-10-CM

## 2018-11-26 DIAGNOSIS — Z79899 Other long term (current) drug therapy: Secondary | ICD-10-CM | POA: Insufficient documentation

## 2018-11-26 DIAGNOSIS — F17210 Nicotine dependence, cigarettes, uncomplicated: Secondary | ICD-10-CM | POA: Insufficient documentation

## 2018-11-26 NOTE — Discharge Instructions (Addendum)
Follow-up with your endocrinologist on Friday as scheduled, and return to the ER if symptoms significantly worsen or change.

## 2018-11-26 NOTE — ED Triage Notes (Signed)
Pt here due to having continuing problems with his sodium level being low. Pt states that he was at [facility] earlier but didn't want to wait any longer there.

## 2018-11-26 NOTE — ED Provider Notes (Signed)
[facility] EMERGENCY DEPARTMENT Provider Note   CSN: [id number] Arrival date & time: 11/26/18  0126     History   Chief Complaint Chief Complaint  Patient presents with  . Low Sodium    HPI Kevin Leon is a 68 y.o. male.     Patient is a 68 year old male with past medical history of hypertension, hepatitis C, atrial fibrillation, anxiety.  He presents today for evaluation of numbness to his left fingertips and around his mouth.  This is been ongoing for the past 2 days.  He also describes feeling lightheaded and dizzy.  Patient denies any nausea, vomiting, or diarrhea.  He has been diagnosed with hyponatremia and is due to see an endocrinologist this Friday.  Patient called his doctor and told him how he was feeling and was instructed to come to the ER to be evaluated.  Patient initially presented to [facility] where he waited for 6 hours, then left and came here to [facility].  He had laboratory studies obtained there, but does not know the results.  The history is provided by the patient.    Past Medical History:  Diagnosis Date  . Anxiety   . Essential hypertension 10/04/2015  . Hepatitis C   . History of stress test    ETT-Echo in 2010 was normal  . History of subdural hematoma 08/15/2016  . HTN (hypertension)   . Persistent atrial fibrillation ([place]) 09/09/2015   CHADS2-VASc=2 //  Apixaban //  s/p DCCV 09/14/15  . Right rotator cuff tear 10/08/2014  . Tobacco abuse 10/04/2015    Patient Active Problem List   Diagnosis Date Noted  . Acute superficial gastritis without hemorrhage 04/04/2017  . Diverticulosis 03/21/2017  . Alcohol consumption heavy 09/18/2016  . History of subdural hematoma 08/15/2016  . Essential hypertension 10/04/2015  . Tobacco abuse 10/04/2015  . Persistent atrial fibrillation ([place]) 09/09/2015  . Right rotator cuff tear 10/08/2014  . Quit smoking within past year 04/02/2014  . GAD (generalized anxiety disorder) 03/31/2013  . Hepatitis C  03/31/2013  . Tinea cruris 03/31/2013    Past Surgical History:  Procedure Laterality Date  . CARDIOVERSION [name] 09/14/2015   Procedure: CARDIOVERSION;  Surgeon: Thayer Headings, MD;  Location: [facility] ENDOSCOPY;  Service: Cardiovascular;  Laterality: [name];  . large piece of wood removed from buttocks     as child  . SHOULDER ARTHROSCOPY WITH ROTATOR CUFF REPAIR Right 10/08/2014   Procedure: RIGHT SHOULDER ARTHROSCOPY, DEBRIDEMENT, ACROMIOPLASTY WITH ROTATOR CUFF REPAIR;  Surgeon: Marchia Bond, MD;  Location: [facility];  Service: Orthopedics;  Laterality: Right;  . TONSILLECTOMY          Home Medications    Prior to Admission medications   Medication Sig Start Date End Date Taking? Authorizing Provider  aspirin 81 MG tablet Take 81 mg by mouth daily.    [provider]  carvedilol (COREG) 6.25 MG tablet TAKE 1 TABLET (6.25 MG TOTAL) BY MOUTH 2 (TWO) TIMES DAILY WITH A MEAL. 09/02/18   Richardson Dopp T, PA-C  ketoconazole (NIZORAL) 2 % cream Apply 1 application topically as needed.  03/31/13   [provider]  LORazepam (ATIVAN) 0.5 MG tablet Take 0.5 mg by mouth every 6 (six) hours as needed for anxiety.  04/21/11   [provider]    Family History Family History  Problem Relation Age of Onset  . Hypertension Mother   . Heart attack Neg Hx     Social History Social History  Tobacco Use  . Smoking status: Current Every Day Smoker    Packs/day: 0.50    Types: Cigarettes    Last attempt to quit: 02/15/2014    Years since quitting: 4.7  . Smokeless tobacco: Never Used  Substance Use Topics  . Alcohol use: Yes    Alcohol/week: 0.0 standard drinks    Comment:  2 beers daily  . 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    Physical Exam Updated Vital Signs BP (!) 189/73 (BP  Location: Left Arm)   Pulse 70   Temp 98 F (36.7 C) (Oral)   Resp 17   Ht 6' (1.829 m)   Wt 73.5 kg   SpO2 100%   BMI 21.97 kg/m   Physical Exam Vitals signs and nursing note reviewed.  Constitutional:      General: He is not in acute distress.    Appearance: He is well-developed. He is not diaphoretic.  HENT:     Head: Normocephalic and atraumatic.  Eyes:     Extraocular Movements: Extraocular movements intact.     Pupils: Pupils are equal, round, and reactive to light.  Neck:     Musculoskeletal: Normal range of motion and neck supple.  Cardiovascular:     Rate and Rhythm: Normal rate and regular rhythm.     Heart sounds: No murmur. No friction rub.  Pulmonary:     Effort: Pulmonary effort is normal. No respiratory distress.     Breath sounds: Normal breath sounds. No wheezing or rales.  Abdominal:     General: Bowel sounds are normal. There is no distension.     Palpations: Abdomen is soft.     Tenderness: There is no abdominal tenderness.  Musculoskeletal: Normal range of motion.  Skin:    General: Skin is warm and dry.  Neurological:     General: No focal deficit present.     Mental Status: He is alert and oriented to person, place, and time.     Cranial Nerves: No cranial nerve deficit.     Motor: No weakness.     Coordination: Coordination normal.      ED Treatments / Results  Labs (all labs ordered are listed, but only abnormal results are displayed) Labs Reviewed - No data to display  EKG ED ECG REPORT   Date: 11/26/2018  Rate: 69  Rhythm: normal sinus rhythm  QRS Axis: normal  Intervals: normal  ST/T Wave abnormalities: normal  Conduction Disutrbances:none  Narrative Interpretation:   Old EKG Reviewed: none available  I have personally reviewed the EKG tracing and agree with the computerized printout as noted.    Radiology No results found.  Procedures Procedures (including critical care time)  Medications Ordered in ED Medications  - No data to display   Initial Impression / Assessment and Plan / ED Course  I have reviewed the triage vital signs and the nursing notes.  Pertinent labs & imaging results that were available during my care of the patient were reviewed by me and considered in my medical decision making (see chart for details).  Patient is a 68 year old male presenting with numbness of his left hand and mouth and recent diagnosis of hyponatremia.  His sodium at a recent doctor's appointment was found to be  125.  He has had a referral made to an endocrinologist who he is due to see this Friday.  Patient admits to drinking 3-4 beers per day, but stopped doing this a couple weeks ago at the recommendation of his doctor.    Laboratory studies drawn at [facility] show his sodium to be 130 and are otherwise unremarkable.  His EKG is normal and shows a normal sinus rhythm.  At this point, I see no indication for further intervention.  Patient has an appointment with his endocrinologist this Friday and I have advised him to keep this appointment.  He is to return if symptoms worsen or change.  Nothing today appears emergent or in need of admission.  Final Clinical Impressions(s) / ED Diagnoses   Final diagnoses:  None    ED Discharge Orders    None       Veryl Speak, MD 11/26/18 [phone]

## 2018-12-30 ENCOUNTER — Ambulatory Visit (INDEPENDENT_AMBULATORY_CARE_PROVIDER_SITE_OTHER): Payer: Medicare Other | Admitting: Physician Assistant

## 2018-12-30 ENCOUNTER — Encounter: Payer: Self-pay | Admitting: Physician Assistant

## 2018-12-30 ENCOUNTER — Telehealth: Payer: Self-pay | Admitting: Radiology

## 2018-12-30 ENCOUNTER — Other Ambulatory Visit: Payer: Self-pay

## 2018-12-30 VITALS — BP 180/70 | HR 62 | Ht 72.0 in | Wt 158.8 lb

## 2018-12-30 DIAGNOSIS — I480 Paroxysmal atrial fibrillation: Secondary | ICD-10-CM | POA: Diagnosis not present

## 2018-12-30 DIAGNOSIS — E871 Hypo-osmolality and hyponatremia: Secondary | ICD-10-CM | POA: Diagnosis not present

## 2018-12-30 DIAGNOSIS — R002 Palpitations: Secondary | ICD-10-CM

## 2018-12-30 DIAGNOSIS — I10 Essential (primary) hypertension: Secondary | ICD-10-CM | POA: Diagnosis not present

## 2018-12-30 DIAGNOSIS — Z7189 Other specified counseling: Secondary | ICD-10-CM

## 2018-12-30 MED ORDER — LISINOPRIL 10 MG PO TABS: 15.0000 mg | ORAL_TABLET | Freq: Every day | ORAL | 1 refills | Status: DC

## 2018-12-30 NOTE — Progress Notes (Signed)
Cardiology Office Note:    Date:  12/30/2018   ID:  Kevin Leon, DOB 1950/12/24, MRN [id number]  PCP:  Kevin Leon, [place]  Cardiologist:  Kevin Moores, MD   Electrophysiologist:  None  Endocrinologist: Dr. Francetta Leon [facility])  Referring MD: Kevin Leon, [place]   Chief Complaint  Patient presents with  . Follow-up    BP issues     History of Present Illness:    Kevin Leon is a 68 y.o. male with:   Paroxysmal atrial fibrillation ? Prior subdural hematoma >> no longer on anticoagulation  Hypertension  Hepatitis C  Tobacco abuse  Hyponatremia  Anxiety    Kevin Leon was last seen in 08/2018.  Since then, he was referred to Endocrinology (Dr. Francetta Leon) who stopped his Lisinopril to see if his sodium would improve.  There was no changed and he was placed back on Lisinopril.  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  He has been managed with fluid restriction and salt liberalization.    He recently contacted me via [place] with some fluctuations in his blood pressure  He also had an irregular heartbeat on his BP monitor.  He was added on today for further evaluation.    He is here alone today.  He notes an occasional skipped beat sensation.  He has not sustained, rapid heartbeats.  He feels this every day.  It is not necessarily like what he had with atrial fibrillation.  He has not had chest pain, shortness of breath, syncope, orthopnea, leg swelling.  He has stopped drinking alcohol altogether and feels better.      Prior CV studies:   The following studies were reviewed today:  Echo 08/30/15 EF 60-65%, normal wall motion, mild MR  Past Medical History:  Diagnosis Date  . Anxiety   . Essential hypertension 10/04/2015  . Hepatitis C   . History of stress test    ETT-Echo in 2010 was normal  . History of subdural hematoma 08/15/2016  . HTN (hypertension)   . Persistent atrial fibrillation ([name])  09/09/2015   CHADS2-VASc=2 //  Apixaban //  s/p DCCV 09/14/15  . Right rotator cuff tear 10/08/2014  . Tobacco abuse 10/04/2015   Surgical Hx: The patient  has a past surgical history that includes Tonsillectomy; large piece of wood removed from buttocks; Shoulder arthroscopy with rotator cuff repair (Right, 10/08/2014); and Cardioversion ([name], 09/14/2015).   Current Medications: Current Meds  Medication Sig  . carvedilol (COREG) 6.25 MG tablet TAKE 1 TABLET (6.25 MG TOTAL) BY MOUTH 2 (TWO) TIMES DAILY WITH A MEAL.  Marland Kitchen LORazepam (ATIVAN) 0.5 MG tablet Take 0.5 mg by mouth every 6 (six) hours as needed for anxiety.   . [DISCONTINUED] lisinopril (ZESTRIL) 10 MG tablet Take by mouth.     Allergies:   No known allergies   Social History   Tobacco Use  . Smoking status: Current Every Day Smoker    Packs/day: 0.50    Types: Cigarettes    Last attempt to quit: 02/15/2014    Years since quitting: 4.8  . Smokeless tobacco: Never Used  Substance Use Topics  . Alcohol use: Yes    Alcohol/week: 0.0 standard drinks    Comment:  2 beers daily  . 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 There is no history of Heart attack.  ROS:   Please see the history of present illness.    ROS All other systems reviewed and are negative.   EKGs/Labs/Other Test Reviewed:    EKG:  EKG is  ordered today.  The ekg ordered today demonstrates normal sinus rhythm, heart rate 62, normal axis, no ST-T wave changes, QTC 420, no change from prior tracings  Recent Labs: 11/25/2018: BUN 11; Creatinine, Ser 0.89; Hemoglobin 14.0; Platelets 235; Potassium 4.1; Sodium 130   Recent Lipid Panel Lab Results  Component Value Date/Time   CHOL 151 04/03/2013 08:40 AM   TRIG 19.0 04/03/2013 08:40 AM   HDL 84.50 04/03/2013 08:40 AM   CHOLHDL 2 04/03/2013 08:40 AM   LDLCALC 63 04/03/2013 08:40 AM     Physical Exam:    VS:  BP (!) 180/70   Pulse 62   Ht 6' (1.829 m)   Wt 158 lb 12.8 oz (72 kg)   SpO2 99%   BMI 21.54 kg/m     Wt Readings from Last 3 Encounters:  12/30/18 158 lb 12.8 oz (72 kg)  11/26/18 162 lb (73.5 kg)  09/02/18 160 lb 6.4 oz (72.8 kg)     Physical Exam  Constitutional: He is oriented to person, place, and time. He appears well-developed and well-nourished. No distress.  HENT:  Head: Normocephalic and atraumatic.  Eyes: No scleral icterus.  Neck: No JVD present. No thyromegaly present.  Cardiovascular: Normal rate, regular rhythm and normal heart sounds.  No murmur heard. Pulmonary/Chest: Effort normal and breath sounds normal. He has no rales.  Abdominal: Soft. There is no hepatomegaly.  Musculoskeletal:        General: No edema.  Lymphadenopathy:    He has no cervical adenopathy.  Neurological: He is alert and oriented to person, place, and time.  Skin: Skin is warm and dry.  Psychiatric: He has a normal mood and affect.    ASSESSMENT & PLAN:    1. Essential hypertension BP today by me was 158/76.  His machine read 184/76 and 176/76.  He was previously on Lisinopril 15 mg once daily and this was stopped during his workup for hyponatremia.  He had one low BP reading but the others have been borderline to elevated.  He is also liberalizing salt to help with his hyponatremia, which is likely helping to push up his blood pressure.    -Increase Lisinopril to 15 mg once daily   -Monitor BP over the next 2 weeks and send reading to me for review  -FU with me in 3 mos.   -He has follow up labs (BMET) thru his endocrinologist the 1st week of Jan.   2. PAF (paroxysmal atrial fibrillation) (HCC) 3. Palpitations Maintaining normal sinus rhythm.  He was taken off of anticoagulation due to prior subdural hematoma.  He has a lot of palpitations.  I suspect these are PACs vs PVCs.  I will have him wear a 14 day Zio patch to assess for recurrent atrial  fibrillation.    4. Hyponatremia He has been dx with SIADH and is limiting water and liberalizing salt.  He has a CXR pending to rule out lung CA.  He has stopped drinking alcohol.  He is followed by endocrinology.    5. Educated about COVID-19 virus infection He inquired about getting a SARS-CoV-2 antibody test but has no known exposure or undiagnosed illness.  I am not certain insurance will cover this.  I will check with our ID service to see what guidance they have.     Dispo:  Return in about 3 months (around 03/30/2019) for Routine Follow Up, w/ Richardson Dopp, PA-C, (virtual or in-person).   Medication Adjustments/Labs and Tests Ordered: Current medicines are reviewed at length with the patient today.  Concerns regarding medicines are outlined above.  Tests Ordered: Orders Placed This Encounter  Procedures  . LONG TERM MONITOR-LIVE TELEMETRY (3-14 DAYS)  . EKG 12-Lead   Medication Changes: Meds ordered this encounter  Medications  . lisinopril (ZESTRIL) 10 MG tablet    Sig: Take 1.5 tablets (15 mg total) by mouth daily.    Dispense:  135 tablet    Refill:  1    Signed, Richardson Dopp, PA-C  12/30/2018 3:17 PM    [facility] [place], [place], [place]  [address] Phone: [phone]; Fax: [phone]

## 2018-12-30 NOTE — Telephone Encounter (Signed)
Enrolled patient for a 14 day Zio monitor to be mailed to patients home.  

## 2018-12-30 NOTE — Telephone Encounter (Signed)
S/w pt SW had a appt time and date today @ 2:15 pm. Pt wanted to be seen today. Pt will have EKG and bring bp cuff today to ov.   Will send to [place] to [place].

## 2018-12-30 NOTE — Patient Instructions (Addendum)
Medication Instructions:   Your physician has recommended you make the following change in your medication:   1) Increase your Lisinopril to 15MG , 1 1/2 tablets by mouth once a day  *If you need a refill on your cardiac medications before your next appointment, please call your pharmacy*  Lab Work:  None ordered today  Testing/Procedures:  [place] Monitor Instructions   Your physician has requested you wear your ZIO patch monitor__14__days.   This is a single patch monitor.  [employer] supplies one patch monitor per enrollment.  Additional stickers are not available.   Please do not apply patch if you will be having a Nuclear Stress Test, Echocardiogram, Cardiac CT, MRI, or Chest Xray during the time frame you would be wearing the monitor. The patch cannot be worn during these tests.  You cannot remove and re-apply the ZIO XT patch monitor.   Your ZIO patch monitor will be sent USPS Priority mail from [facility] directly to your home address. The monitor may also be mailed to a PO BOX if home delivery is not available.   It may take 3-5 days to receive your monitor after you have been enrolled.   Once you have received you monitor, please review enclosed instructions.  Your monitor has already been registered assigning a specific monitor serial # to you.   Applying the monitor   Shave hair from upper left chest.   Hold abrader disc by orange tab.  Rub abrader in 40 strokes over left upper chest as indicated in your monitor instructions.   Clean area with 4 enclosed alcohol pads .  Use all pads to assure are is cleaned thoroughly.  Let dry.   Apply patch as indicated in monitor instructions.  Patch will be place under collarbone on left side of chest with arrow pointing upward.   Rub patch adhesive wings for 2 minutes.Remove white label marked "1".  Remove white label marked "2".  Rub patch adhesive wings for 2 additional minutes.   While looking in a mirror,  press and release button in center of patch.  A small green light will flash 3-4 times .  This will be your only indicator the monitor has been turned on.     Do not shower for the first 24 hours.  You may shower after the first 24 hours.   Press button if you feel a symptom. You will hear a small click.  Record Date, Time and Symptom in the Patient Log Book.   When you are ready to remove patch, follow instructions on last 2 pages of Patient Log Book.  Stick patch monitor onto last page of Patient Log Book.   Place Patient Log Book in [place] box.  Use locking tab on box and tape box closed securely.  The [employer] and [employer] has [employer] on it.  Please place in mailbox as soon as possible.  Your physician should have your test results approximately 7 days after the monitor has been mailed back to [facility].   Call [place] at [phone] if you have questions regarding your ZIO XT patch monitor.  Call them immediately if you see an orange light blinking on your monitor.   If your monitor falls off in less than 4 days contact our Monitor department at [phone].  If your monitor becomes loose or falls off after 4 days call [employer] at [phone] for suggestions on securing your monitor.    Follow-Up:  On 03/31/19 at 9:15AM with Richardson Dopp, PA-C  Check your blood pressure once a day for 2 weeks, send the readings to [facility] via mychart.

## 2018-12-30 NOTE — Telephone Encounter (Signed)
See patient message. Please schedule in office visit with me or Dr. Acie Fredrickson.  He will need an ECG. Richardson Dopp, PA-C    12/30/2018 10:20 AM

## 2019-01-10 ENCOUNTER — Other Ambulatory Visit (INDEPENDENT_AMBULATORY_CARE_PROVIDER_SITE_OTHER): Payer: Medicare Other

## 2019-01-10 DIAGNOSIS — E871 Hypo-osmolality and hyponatremia: Secondary | ICD-10-CM | POA: Diagnosis not present

## 2019-01-10 DIAGNOSIS — Z7189 Other specified counseling: Secondary | ICD-10-CM

## 2019-01-10 DIAGNOSIS — I10 Essential (primary) hypertension: Secondary | ICD-10-CM | POA: Diagnosis not present

## 2019-01-10 DIAGNOSIS — R002 Palpitations: Secondary | ICD-10-CM | POA: Diagnosis not present

## 2019-01-10 DIAGNOSIS — I480 Paroxysmal atrial fibrillation: Secondary | ICD-10-CM

## 2019-01-22 ENCOUNTER — Other Ambulatory Visit: Payer: Self-pay | Admitting: Physician Assistant

## 2019-02-04 ENCOUNTER — Encounter: Payer: Self-pay | Admitting: Physician Assistant

## 2019-02-04 ENCOUNTER — Telehealth: Payer: Self-pay

## 2019-02-04 DIAGNOSIS — I4729 Other ventricular tachycardia: Secondary | ICD-10-CM

## 2019-02-04 DIAGNOSIS — I480 Paroxysmal atrial fibrillation: Secondary | ICD-10-CM

## 2019-02-04 DIAGNOSIS — I472 Ventricular tachycardia: Secondary | ICD-10-CM

## 2019-02-04 NOTE — Telephone Encounter (Signed)
The patient has been notified of the result and verbalized understanding.  All questions (if any) were answered. Echocardiogram scheduled. Mady Haagensen, [place] 02/04/2019 4:53 PM

## 2019-02-18 ENCOUNTER — Other Ambulatory Visit: Payer: Self-pay

## 2019-02-18 ENCOUNTER — Encounter: Payer: Self-pay | Admitting: Physician Assistant

## 2019-02-18 ENCOUNTER — Ambulatory Visit (HOSPITAL_COMMUNITY): Payer: Medicare Other | Attending: Cardiology

## 2019-02-18 DIAGNOSIS — I480 Paroxysmal atrial fibrillation: Secondary | ICD-10-CM | POA: Insufficient documentation

## 2019-02-18 DIAGNOSIS — I472 Ventricular tachycardia: Secondary | ICD-10-CM | POA: Diagnosis not present

## 2019-02-18 DIAGNOSIS — I4729 Other ventricular tachycardia: Secondary | ICD-10-CM

## 2019-02-18 DIAGNOSIS — I351 Nonrheumatic aortic (valve) insufficiency: Secondary | ICD-10-CM | POA: Insufficient documentation

## 2019-03-30 NOTE — Progress Notes (Signed)
Cardiology Office Note:    Date:  03/31/2019   ID:  Kevin Leon, DOB 1950-06-24, MRN [id number]  PCP:  Kevin Leon, [place]  Cardiologist:  Kevin Moores, MD  Electrophysiologist:  None  Endocrinologist: Dr. Francetta [facility])  Referring MD: Kevin Aly, FNP   Chief Complaint:  Follow-up (BP)    Patient Profile:    Kevin Leon is a 69 y.o. male with:   Paroxysmal atrial fibrillation ? Prior subdural hematoma>>no longer on anticoagulation  Aortic insufficiency   Echocardiogram 02/2019: EF 60-65, Gr 2 DD mild to mod AI  Hypertension  Hepatitis C  Tobacco abuse  Hyponatremia  Anxiety    Rheumatoid Arthritis  Latent TB Infection   Palpitations  Monitor 02/2019: NSR, 4 bt NSVT, 6 bt SVT  Prior CV studies: Echocardiogram 02/18/2019 EF 60-65, Gr 2 DD, no RWMA, mild LAE, trivial MR, trivial Tr, mild to mod AI  3-14 day monitor 01/2019 Normal sinus rhythm, 4 beat NSVT, single episode of SVT (6 beats)  Echo 08/30/15 EF 60-65%, normal wall motion, mild MR  History of Present Illness:    Mr. Kevin Leon was last seen in clinic in 12/2018.  I adjusted his medications for blood pressure.  He noted palpitations and I set him up for a 3-14 day monitor.  This demonstrated normal sinus rhythm, 1 episode of SVT and 4 beat episode of NSVT.  I had him get an echocardiogram and it demonstrated normal EF.  Incidentally, he was noted to have mild to mod AI.  He returns for follow-up.  He is here alone.  Since last seen, he has had several issues.  He had basal cell carcinoma recently removed from his right temple area.  He has also been diagnosed with rheumatoid arthritis.  He was being evaluated for Humira and it was noted that he has latent TB.  He has to see infectious disease to determine the treatment before he can start something like Humira.  He has been on a prednisone taper for several weeks.  He has not had chest pain, shortness of breath, lower extremity  swelling.  His blood pressures at home have been 140s/80-70.    Past Medical History:  Diagnosis Date  . Anxiety   . Aortic insufficiency    Echo 02/2019: EF 60-65, Gr 2 DD, normal wall motion, normal RV SF, mild LAE, trivial MR/TR, mild to moderate AI  . Essential hypertension 10/04/2015  . Hepatitis C   . History of stress test    ETT-Echo in 2010 was normal  . History of subdural hematoma 08/15/2016  . HTN (hypertension)   . Persistent atrial fibrillation ([place]) 09/09/2015   CHADS2-VASc=2 //  Apixaban //  s/p DCCV 09/14/15 // Monitor 01/2019: NSR, single NSVT (4 beats); single episode of SVT (6 beats)  . Right rotator cuff tear 10/08/2014  . Tobacco abuse 10/04/2015    Current Medications: Current Meds  Medication Sig  . carvedilol (COREG) 6.25 MG tablet TAKE 1 TABLET (6.25 MG TOTAL) BY MOUTH 2 (TWO) TIMES DAILY WITH A MEAL.  Marland Kitchen lisinopril (ZESTRIL) 20 MG tablet Take 1 tablet (20 mg total) by mouth daily.  Marland Kitchen LORazepam (ATIVAN) 0.5 MG tablet Take 0.5 mg by mouth every 6 (six) hours as needed for anxiety.   . predniSONE (DELTASONE) 5 MG tablet   . [DISCONTINUED] lisinopril (ZESTRIL) 10 MG tablet Take 1.5 tablets (15 mg total) by mouth daily.  . [DISCONTINUED] lisinopril (ZESTRIL) 5 MG tablet Take 5 mg by mouth daily.  Allergies:   No known allergies   Social History   Tobacco Use  . Smoking status: Current Every Day Smoker    Packs/day: 0.50    Types: Cigarettes    Last attempt to quit: 02/15/2014    Years since quitting: 5.1  . Smokeless tobacco: Never Used  Substance Use Topics  . Alcohol use: Yes    Alcohol/week: 0.0 standard drinks    Comment:  2 beers daily  . 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 There is no history of Heart attack.  ROS   EKGs/Labs/Other Test Reviewed:    EKG:  EKG is not ordered today.  The ekg ordered today demonstrates  n/a  Recent Labs: 11/25/2018: BUN 11; Creatinine, Ser 0.89; Hemoglobin 14.0; Platelets 235; Potassium 4.1; Sodium 130   Recent Lipid Panel Lab Results  Component Value Date/Time   CHOL 151 04/03/2013 08:40 AM   TRIG 19.0 04/03/2013 08:40 AM   HDL 84.50 04/03/2013 08:40 AM   CHOLHDL 2 04/03/2013 08:40 AM   LDLCALC 63 04/03/2013 08:40 AM    Physical Exam:    VS:  BP (!) 162/52   Pulse 70   Ht 6' (1.829 m)   Wt 159 lb 6.4 oz (72.3 kg)   SpO2 95%   BMI 21.62 kg/m     Wt Readings from Last 3 Encounters:  03/31/19 159 lb 6.4 oz (72.3 kg)  12/30/18 158 lb 12.8 oz (72 kg)  11/26/18 162 lb (73.5 kg)     Constitutional:      Appearance: Healthy appearance. Not in distress.  Neck:     Thyroid: Thyroid normal.     Vascular: JVD normal.  Pulmonary:     Effort: Pulmonary effort is normal.     Breath sounds: No wheezing. No rales.  Cardiovascular:     Normal rate. Regular rhythm. Normal S1. Normal S2.     Murmurs: There is no murmur.  Edema:    Peripheral edema absent.  Abdominal:     Palpations: Abdomen is soft. There is no hepatomegaly.  Skin:    General: Skin is warm and dry.  Neurological:     General: No focal deficit present.     Mental Status: Alert and oriented to person, place and time.     Cranial Nerves: Cranial nerves are intact.      ASSESSMENT & PLAN:    1. Essential hypertension Blood pressure above target.  It usually runs up in the office.  His readings at home have been 140/60-70.  His pressures have been somewhat higher since starting on prednisone.  Therefore, I have recommended increasing lisinopril to 20 mg daily.  Continue current dose of carvedilol.  Obtain BMET in 2 weeks.  He knows to contact us if his pressures are remaining above target (130/80).  2. Nonrheumatic aortic valve insufficiency Mild to moderate by echocardiogram in February 2021.  Plan repeat in 1 year.  3. Palpitations No significant arrhythmias noted on recent monitor.  There  was no recurrent atrial fibrillation.  EF is normal by echocardiogram in 02/2019.  He has not had any significant palpitations.    Dispo:  Return in about 1 year (around 03/30/2020) for Routine Follow Up, w/ Dr. Acie Fredrickson, or Richardson Dopp, PA-C, in person.   Medication Adjustments/Labs and Tests Ordered: Current medicines are reviewed at length with  the patient today.  Concerns regarding medicines are outlined above.  Tests Ordered: Orders Placed This Encounter  Procedures  . Basic metabolic panel  . ECHOCARDIOGRAM COMPLETE   Medication Changes: Meds ordered this encounter  Medications  . lisinopril (ZESTRIL) 20 MG tablet    Sig: Take 1 tablet (20 mg total) by mouth daily.    Dispense:  90 tablet    Refill:  3    Signed, Richardson Dopp, PA-C  03/31/2019 10:08 AM    [facility] [address], [place], [place]  [address] Phone: [phone]; Fax: [phone]

## 2019-03-31 ENCOUNTER — Encounter: Payer: Self-pay | Admitting: Physician Assistant

## 2019-03-31 ENCOUNTER — Other Ambulatory Visit: Payer: Self-pay

## 2019-03-31 ENCOUNTER — Ambulatory Visit: Payer: Medicare Other | Admitting: Physician Assistant

## 2019-03-31 VITALS — BP 162/52 | HR 70 | Ht 72.0 in | Wt 159.4 lb

## 2019-03-31 DIAGNOSIS — I351 Nonrheumatic aortic (valve) insufficiency: Secondary | ICD-10-CM | POA: Diagnosis not present

## 2019-03-31 DIAGNOSIS — I10 Essential (primary) hypertension: Secondary | ICD-10-CM | POA: Diagnosis not present

## 2019-03-31 DIAGNOSIS — R002 Palpitations: Secondary | ICD-10-CM | POA: Diagnosis not present

## 2019-03-31 MED ORDER — LISINOPRIL 20 MG PO TABS: 20.0000 mg | ORAL_TABLET | Freq: Every day | ORAL | 3 refills | Status: DC

## 2019-03-31 NOTE — Patient Instructions (Addendum)
Medication Instructions:   Your physician has recommended you make the following change in your medication:   1) Increase your Lisinopril to 20 mg, 1 tablet by mouth once a day  *If you need a refill on your cardiac medications before your next appointment, please call your pharmacy*  Lab Work:  Your physician recommends that you return for lab work in 2 weeks on 04/14/19 at 10:00AM  If you have labs (blood work) drawn today and your tests are completely normal, you will receive your results only by: Marland Kitchen MyChart Message (if you have MyChart) OR . A paper copy in the mail If you have any lab test that is abnormal or we need to change your treatment, we will call you to review the results.  Testing/Procedures:  None ordered today  Follow-Up: At [facility], you and your health needs are our priority.  As part of our continuing mission to provide you with exceptional heart care, we have created designated Provider Care Teams.  These Care Teams include your primary Cardiologist (physician) and Advanced Practice Providers (APPs -  Physician Assistants and Nurse Practitioners) who all work together to provide you with the care you need, when you need it.  We recommend signing up for the patient portal called "MyChart".  Sign up information is provided on this After Visit Summary.  MyChart is used to connect with patients for Virtual Visits (Telemedicine).  Patients are able to view lab/test results, encounter notes, upcoming appointments, etc.  Non-urgent messages can be sent to your provider as well.   To learn more about what you can do with MyChart, go to [email or web address].    Your next appointment:   12 month(s)  The format for your next appointment:   In Person  Provider:   You may see Mertie Moores, MD or Richardson Dopp, PA-C   Other Instructions  Call our office or send [place] a mychart message if you see your blood pressure running over 130/80 consistently.

## 2019-04-14 ENCOUNTER — Other Ambulatory Visit: Payer: Medicare Other

## 2019-05-28 DIAGNOSIS — I10 Essential (primary) hypertension: Secondary | ICD-10-CM

## 2019-05-29 MED ORDER — LISINOPRIL 20 MG PO TABS: 30.0000 mg | ORAL_TABLET | Freq: Every day | ORAL | 3 refills | Status: DC

## 2019-05-29 NOTE — Telephone Encounter (Signed)
Increase Lisinopril to 30 mg once daily  BMET 1 week Richardson Dopp, PA-C    05/29/2019 8:04 AM

## 2019-05-29 NOTE — Telephone Encounter (Signed)
I called and spoke with patient, he does not need a new prescription sent in. Patient will come back on 06/05/19 for BMET.

## 2019-06-05 ENCOUNTER — Other Ambulatory Visit: Payer: Medicare Other | Admitting: *Deleted

## 2019-06-05 ENCOUNTER — Other Ambulatory Visit: Payer: Self-pay

## 2019-06-05 DIAGNOSIS — I10 Essential (primary) hypertension: Secondary | ICD-10-CM

## 2019-06-05 LAB — BASIC METABOLIC PANEL
BUN/Creatinine Ratio: 16 (ref 10–24)
BUN: 16 mg/dL (ref 8–27)
CO2: 23 mmol/L (ref 20–29)
Calcium: 8.8 mg/dL (ref 8.6–10.2)
Chloride: 98 mmol/L (ref 96–106)
Creatinine, Ser: 0.97 mg/dL (ref 0.76–1.27)
GFR calc Af Amer: 92 mL/min/{1.73_m2} (ref >59)
GFR calc non Af Amer: 79 mL/min/{1.73_m2} (ref >59)
Glucose: 117 mg/dL — ABNORMAL HIGH (ref 65–99)
Potassium: 4.2 mmol/L (ref 3.5–5.2)
Sodium: 137 mmol/L (ref 134–144)

## 2019-07-01 ENCOUNTER — Other Ambulatory Visit: Payer: Self-pay | Admitting: Physician Assistant

## 2019-07-11 ENCOUNTER — Encounter (HOSPITAL_COMMUNITY): Payer: Self-pay | Admitting: Emergency Medicine

## 2019-07-11 ENCOUNTER — Emergency Department (HOSPITAL_COMMUNITY): Payer: Medicare Other

## 2019-07-11 ENCOUNTER — Emergency Department (HOSPITAL_COMMUNITY)
Admission: EM | Admit: 2019-07-11 | Discharge: 2019-07-11 | Disposition: A | Discharge status: Home / Self Care | Payer: Medicare Other | Attending: Emergency Medicine | Admitting: Emergency Medicine

## 2019-07-11 ENCOUNTER — Other Ambulatory Visit: Payer: Self-pay

## 2019-07-11 DIAGNOSIS — S2241XA Multiple fractures of ribs, right side, initial encounter for closed fracture: Secondary | ICD-10-CM | POA: Insufficient documentation

## 2019-07-11 DIAGNOSIS — Y9289 Other specified places as the place of occurrence of the external cause: Secondary | ICD-10-CM | POA: Insufficient documentation

## 2019-07-11 DIAGNOSIS — I10 Essential (primary) hypertension: Secondary | ICD-10-CM | POA: Insufficient documentation

## 2019-07-11 DIAGNOSIS — Z79899 Other long term (current) drug therapy: Secondary | ICD-10-CM | POA: Insufficient documentation

## 2019-07-11 DIAGNOSIS — S0990XA Unspecified injury of head, initial encounter: Secondary | ICD-10-CM | POA: Diagnosis not present

## 2019-07-11 DIAGNOSIS — W5582XA Struck by other mammals, initial encounter: Secondary | ICD-10-CM | POA: Insufficient documentation

## 2019-07-11 DIAGNOSIS — R109 Unspecified abdominal pain: Secondary | ICD-10-CM | POA: Insufficient documentation

## 2019-07-11 DIAGNOSIS — F17210 Nicotine dependence, cigarettes, uncomplicated: Secondary | ICD-10-CM | POA: Insufficient documentation

## 2019-07-11 DIAGNOSIS — Y998 Other external cause status: Secondary | ICD-10-CM | POA: Diagnosis not present

## 2019-07-11 DIAGNOSIS — Y9389 Activity, other specified: Secondary | ICD-10-CM | POA: Insufficient documentation

## 2019-07-11 DIAGNOSIS — S299XXA Unspecified injury of thorax, initial encounter: Secondary | ICD-10-CM | POA: Diagnosis present

## 2019-07-11 HISTORY — DX: Respiratory tuberculosis unspecified: A15.9

## 2019-07-11 HISTORY — DX: Unspecified osteoarthritis, unspecified site: M19.90

## 2019-07-11 LAB — CBC
HCT: 38.8 % — ABNORMAL LOW (ref 39.0–52.0)
Hemoglobin: 12.8 g/dL — ABNORMAL LOW (ref 13.0–17.0)
MCH: 32.2 pg (ref 26.0–34.0)
MCHC: 33 g/dL (ref 30.0–36.0)
MCV: 97.5 fL (ref 80.0–100.0)
Platelets: 182 10*3/uL (ref 150–400)
RBC: 3.98 MIL/uL — ABNORMAL LOW (ref 4.22–5.81)
RDW: 12.8 % (ref 11.5–15.5)
WBC: 6.7 10*3/uL (ref 4.0–10.5)
nRBC: 0 % (ref 0.0–0.2)

## 2019-07-11 LAB — BASIC METABOLIC PANEL
Anion gap: 8 (ref 5–15)
BUN: 15 mg/dL (ref 8–23)
CO2: 28 mmol/L (ref 22–32)
Calcium: 8.5 mg/dL — ABNORMAL LOW (ref 8.9–10.3)
Chloride: 95 mmol/L — ABNORMAL LOW (ref 98–111)
Creatinine, Ser: 1.09 mg/dL (ref 0.61–1.24)
GFR calc Af Amer: >60 mL/min (ref >60)
GFR calc non Af Amer: >60 mL/min (ref >60)
Glucose, Bld: 92 mg/dL (ref 70–99)
Potassium: 4 mmol/L (ref 3.5–5.1)
Sodium: 131 mmol/L — ABNORMAL LOW (ref 135–145)

## 2019-07-11 MED ORDER — IOHEXOL 300 MG/ML  SOLN
100.0000 mL | Freq: Once | INTRAMUSCULAR | Status: AC | PRN
  Administered 2019-07-11: 100 mL via INTRAVENOUS

## 2019-07-11 MED ORDER — SODIUM CHLORIDE 0.9 % IV SOLN: INTRAVENOUS | Status: DC

## 2019-07-11 NOTE — ED Triage Notes (Signed)
Patient was kicked in right side of chest/abd today. Per patient pain with deep breath and movement. Patient states that he fell backwards and hit head. Per patient no LOC, dizziness, headache, blurred vision, or nausea/vomiting. Patient has skin tear to left elbow. Dressed with telfa and gauze.

## 2019-07-11 NOTE — ED Notes (Signed)
Pt in bed, pt states that he got kicked by a horse, pt has a large abrasion to his R flank, abd soft with bowel sounds, lungs clear, breathing shallow, reports pain with breathing, states that he hit his head, denies loc, pt awake and oriented times three, pupils are 13mm and reactive to light.

## 2019-07-11 NOTE — ED Provider Notes (Signed)
[facility] EMERGENCY DEPARTMENT Provider Note   CSN: [id number] Arrival date & time: 07/11/19  1436     History Chief Complaint  Patient presents with  . Rib Injury    Kevin Leon is a 69 y.o. male.  Patient was kicked by horse shortly prior to arrival.  Kicked on the right side chest upper part of abdomen.  Patient with complaint of pain with deep breath and movement.  He also fell backwards and hit his head.  No loss of consciousness.  Denies any headache dizziness blurred vision or nausea vomiting.  Patient states tetanus up-to-date.  In addition he did have get a skin tear to his left elbow area.        Past Medical History:  Diagnosis Date  . Anxiety   . Aortic insufficiency    Echo 02/2019: EF 60-65, Gr 2 DD, normal wall motion, normal RV SF, mild LAE, trivial MR/TR, mild to moderate AI  . Arthritis   . Essential hypertension 10/04/2015  . Hepatitis C   . History of stress test    ETT-Echo in 2010 was normal  . History of subdural hematoma 08/15/2016  . HTN (hypertension)   . Persistent atrial fibrillation ([place]) 09/09/2015   CHADS2-VASc=2 //  Apixaban //  s/p DCCV 09/14/15 // Monitor 01/2019: NSR, single NSVT (4 beats); single episode of SVT (6 beats)  . Right rotator cuff tear 10/08/2014  . Tobacco abuse 10/04/2015  . Tuberculosis     Patient Active Problem List   Diagnosis Date Noted  . Aortic insufficiency   . Acute superficial gastritis without hemorrhage 04/04/2017  . Diverticulosis 03/21/2017  . Alcohol consumption heavy 09/18/2016  . History of subdural hematoma 08/15/2016  . Essential hypertension 10/04/2015  . Tobacco abuse 10/04/2015  . Persistent atrial fibrillation ([place]) 09/09/2015  . Right rotator cuff tear 10/08/2014  . Quit smoking within past year 04/02/2014  . GAD (generalized anxiety disorder) 03/31/2013  . Hepatitis C 03/31/2013  . Tinea cruris 03/31/2013    Past Surgical History:  Procedure Laterality Date  . CARDIOVERSION [name]  09/14/2015   Procedure: CARDIOVERSION;  Surgeon: Thayer Headings, MD;  Location: [facility] ENDOSCOPY;  Service: Cardiovascular;  Laterality: [name];  . large piece of wood removed from buttocks     as child  . SHOULDER ARTHROSCOPY WITH ROTATOR CUFF REPAIR Right 10/08/2014   Procedure: RIGHT SHOULDER ARTHROSCOPY, DEBRIDEMENT, ACROMIOPLASTY WITH ROTATOR CUFF REPAIR;  Surgeon: Marchia Bond, MD;  Location: [place];  Service: Orthopedics;  Laterality: Right;  . TONSILLECTOMY         Family History  Problem Relation Age of Onset  . Hypertension Mother   . Heart attack Neg Hx     Social History   Tobacco Use  . Smoking status: Current Every Day Smoker    Packs/day: 0.50    Types: Cigarettes    Last attempt to quit: 02/15/2014    Years since quitting: 5.4  . Smokeless tobacco: Never Used  Vaping Use  . Vaping Use: Never used  Substance Use Topics  . Alcohol use: Never    Alcohol/week: 0.0 standard drinks  . 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 Authorizing Provider  carvedilol (COREG) 6.25 MG tablet TAKE 1 TABLET (6.25 MG TOTAL) BY MOUTH 2 (TWO) TIMES DAILY WITH A MEAL. Patient  taking differently: Take 6.25 mg by mouth 2 (two) times daily with a meal.  01/22/19  Yes Weaver, Aloma Boch T, PA-C  hydroxychloroquine (PLAQUENIL) 200 MG tablet Take 200 mg by mouth daily. 07/01/19  Yes [provider]  lisinopril (ZESTRIL) 20 MG tablet Take 1.5 tablets (30 mg total) by mouth daily. 05/29/19  Yes Weaver, Sarahelizabeth Conway T, PA-C  LORazepam (ATIVAN) 0.5 MG tablet Take 0.5 mg by mouth every 6 (six) hours as needed for anxiety.  04/21/11  Yes [provider]  predniSONE (DELTASONE) 5 MG tablet Take 5 mg by mouth daily with breakfast.  03/30/19  Yes [provider]    Allergies    No known allergies  Review of Systems   Review of Systems    Constitutional: Negative for chills and fever.  HENT: Negative for rhinorrhea and sore throat.   Eyes: Negative for visual disturbance.  Respiratory: Positive for shortness of breath. Negative for cough.   Cardiovascular: Positive for chest pain. Negative for leg swelling.  Gastrointestinal: Positive for abdominal pain. Negative for diarrhea, nausea and vomiting.  Genitourinary: Negative for dysuria.  Musculoskeletal: Negative for back pain and neck pain.  Skin: Negative for rash.  Neurological: Negative for dizziness, light-headedness and headaches.  Hematological: Does not bruise/bleed easily.  Psychiatric/Behavioral: Negative for confusion.    Physical Exam Updated Vital Signs BP (!) 188/74 (BP Location: Left Arm)   Pulse 83   Temp 98.1 F (36.7 C) (Oral)   Resp 20   Ht 1.829 m (6')   Wt 68 kg   SpO2 99%   BMI 20.34 kg/m   Physical Exam Vitals and nursing note reviewed.  Constitutional:      Appearance: Normal appearance. He is well-developed.  HENT:     Head: Normocephalic and atraumatic.  Eyes:     Extraocular Movements: Extraocular movements intact.     Conjunctiva/sclera: Conjunctivae normal.     Pupils: Pupils are equal, round, and reactive to light.  Cardiovascular:     Rate and Rhythm: Normal rate and regular rhythm.     Heart sounds: No murmur heard.   Pulmonary:     Effort: Pulmonary effort is normal. No respiratory distress.     Breath sounds: Normal breath sounds.     Comments: Tenderness palpation right lateral lower ribs also has an abrasion there and includes part of the right flank right upper quadrant area.  Tenderness to palpation there as well. Chest:     Chest wall: Tenderness present.  Abdominal:     Palpations: Abdomen is soft.     Tenderness: There is no abdominal tenderness.  Musculoskeletal:        General: Normal range of motion.     Cervical back: Normal range of motion and neck supple.     Comments: Skin tear to the left elbow.   Skin:    General: Skin is warm and dry.     Capillary Refill: Capillary refill takes less than 2 seconds.  Neurological:     General: No focal deficit present.     Mental Status: He is alert and oriented to person, place, and time.     ED Results / Procedures / Treatments   Labs (all labs ordered are listed, but only abnormal results are displayed) Labs Reviewed  BASIC METABOLIC PANEL - Abnormal; Notable for the following components:      Result Value   Sodium 131 (*)    Chloride 95 (*)    Calcium 8.5 (*)  All other components within normal limits  CBC - Abnormal; Notable for the following components:   RBC 3.98 (*)    Hemoglobin 12.8 (*)    HCT 38.8 (*)    All other components within normal limits    EKG None  Radiology DG Ribs Unilateral W/Chest Right  Result Date: 07/11/2019 CLINICAL DATA:  Kicked by horse EXAM: RIGHT RIBS AND CHEST - 3+ VIEW COMPARISON:  10/05/2015 FINDINGS: Mild hyperinflation. Heart is normal size. Lungs are clear. No effusions or pneumothorax. Fractures noted through the right 5th through 8th ribs. IMPRESSION: Fractures through the right 5th through 8th ribs.  No pneumothorax. Electronically Signed   By: Rolm Baptise M.D.   On: 07/11/2019 15:23   CT Head Wo Contrast  Result Date: 07/11/2019 CLINICAL DATA:  Trauma, kicked in right side EXAM: CT HEAD WITHOUT CONTRAST TECHNIQUE: Contiguous axial images were obtained from the base of the skull through the vertex without intravenous contrast. COMPARISON:  12/26/2015 FINDINGS: Brain: Mild atrophy. No acute intracranial abnormality. Specifically, no hemorrhage, hydrocephalus, mass lesion, acute infarction, or significant intracranial injury. Vascular: No hyperdense vessel or unexpected calcification. Skull: No acute calvarial abnormality. Sinuses/Orbits: Visualized paranasal sinuses and mastoids clear. Orbital soft tissues unremarkable. Other: None IMPRESSION: No acute intracranial abnormality. Electronically  Signed   By: Rolm Baptise M.D.   On: 07/11/2019 17:16   CT Chest W Contrast  Result Date: 07/11/2019 CLINICAL DATA:  Patient was kicked in right side of chest/abd today. Per patient pain with deep breath and movement. Patient states that he fell backwards and hit head. Per patient no LOC, dizziness, headache, blurred vision, or nausea/vomiting. Hx of TB, HTN, subdural hematoma, HEP C, aortic insufficiency EXAM: CT CHEST, ABDOMEN, AND PELVIS WITH CONTRAST TECHNIQUE: Multidetector CT imaging of the chest, abdomen and pelvis was performed following the standard protocol during bolus administration of intravenous contrast. CONTRAST:  146mL OMNIPAQUE IOHEXOL 300 MG/ML  SOLN COMPARISON:  01/19/2010 FINDINGS: CT CHEST FINDINGS Cardiovascular: Heart normal in size and configuration. There are 2 vessel coronary artery calcifications. No pericardial effusion. Great vessels are normal in caliber. No evidence of vascular injury. Mild aortic atherosclerosis. Mediastinum/Nodes: No enlarged mediastinal, hilar, or axillary lymph nodes. Thyroid gland, trachea, and esophagus demonstrate no significant findings. Lungs/Pleura: Small right pleural effusion. Mild dependent hazy opacity in the right lower lobe. Focal area of opacity in the anterior aspect of the right lower lobe adjacent to the oblique fissure associated with a small amount of pleural based air. Minor dependent opacity in the left lower lobe consistent with atelectasis. Mild to moderate centrilobular emphysema. Mild areas of interstitial thickening which appear chronic. No lung mass or nodule. Paraseptal emphysema noted at the lung apices associated with mild pleuroparenchymal scarring. Musculoskeletal: Multiple right-sided rib fractures. There are displaced fractures of the posterior right 6 and seventh ribs associated with pleural base soft tissue swelling and small bubbles of pleural space air, as well as air in the intercostal soft tissues. There are additional  nondisplaced fractures of the posterior left eighth and ninth ribs. There are fractures of the lateral right eighth, seventh and sixth ribs. Additional fracture noted along the anterior right sixth rib and anterior seventh rib. The seventh rib anterior fracture associated with soft tissue swelling and adjacent soft tissue air. No other acute fractures.  No bone lesions. CT ABDOMEN PELVIS FINDINGS Hepatobiliary: Normal liver size and attenuation. No contusion or laceration. No mass or focal lesion normal gallbladder. No bile duct dilation. Pancreas: No pancreatic contusion or  laceration. No mass or inflammation. Spleen: Normal spleen size.  No contusion or laceration.  No masses. Adrenals/Urinary Tract: No adrenal mass or hemorrhage. Kidneys normal in size, orientation and position with symmetric enhancement and excretion. No contusion or laceration. Right kidney posterior lower pole 1.4 cm cyst. No other masses, no stones and no hydronephrosis. Normal ureters. Normal bladder. Stomach/Bowel: Subtle haziness in the inferior small bowel mesentery, in the central to upper pelvis, nonspecific. No convincing bowel injury. Small bowel and colon are normal in caliber. No wall thickening or convincing acute inflammation. Normal stomach. Vascular/Lymphatic: No evidence of a vascular injury. Diffuse aortic, iliac and branch vessel atherosclerosis. Reproductive: No acute abnormality. Mildly enlarged prostate, 4.6 cm in greatest transverse dimension. Other: No ascites or hemoperitoneum.  No abdominal wall hernia. Musculoskeletal: No fracture or acute finding.  No bone lesion. IMPRESSION: 1. Multiple right-sided rib fractures as detailed above. Small areas of lung contusion along the anterior aspect of the right lower lobe adjacent to the oblique fissure with a small amount of localized pleural based air. Small right pleural effusion associated with dependent atelectasis, less likely contusion. Minimal pleural base bubbles of air  are noted along the non dependent aspect of the right pleural fluid. Small amounts of soft tissue air are seen adjacent to several of the right-sided rib fractures. 2. There is no free pneumothorax. 3. No other acute abnormality within the chest. 4. Coronary artery calcifications and aortic atherosclerosis. 5. Mild to moderate centrilobular emphysema with mild apical paraseptal emphysema. 6. No acute injury or findings within the abdomen or pelvis. Electronically Signed   By: Lajean Manes M.D.   On: 07/11/2019 17:30   CT Cervical Spine Wo Contrast  Result Date: 07/11/2019 CLINICAL DATA:  Trauma, kicked in right side. EXAM: CT CERVICAL SPINE WITHOUT CONTRAST TECHNIQUE: Multidetector CT imaging of the cervical spine was performed without intravenous contrast. Multiplanar CT image reconstructions were also generated. COMPARISON:  None. FINDINGS: Alignment: Normal Skull base and vertebrae: No acute fracture. No primary bone lesion or focal pathologic process. Soft tissues and spinal canal: No prevertebral fluid or swelling. No visible canal hematoma. Disc levels: Degenerative spurring anteriorly. Mild diffuse degenerative facet disease bilaterally, left greater than right. Upper chest: Emphysema in the apices. Other: None IMPRESSION: Mild degenerative changes.  No acute bony abnormality. Electronically Signed   By: Rolm Baptise M.D.   On: 07/11/2019 17:16   CT Abdomen Pelvis W Contrast  Result Date: 07/11/2019 CLINICAL DATA:  Patient was kicked in right side of chest/abd today. Per patient pain with deep breath and movement. Patient states that he fell backwards and hit head. Per patient no LOC, dizziness, headache, blurred vision, or nausea/vomiting. Hx of TB, HTN, subdural hematoma, HEP C, aortic insufficiency EXAM: CT CHEST, ABDOMEN, AND PELVIS WITH CONTRAST TECHNIQUE: Multidetector CT imaging of the chest, abdomen and pelvis was performed following the standard protocol during bolus administration of  intravenous contrast. CONTRAST:  132mL OMNIPAQUE IOHEXOL 300 MG/ML  SOLN COMPARISON:  01/19/2010 FINDINGS: CT CHEST FINDINGS Cardiovascular: Heart normal in size and configuration. There are 2 vessel coronary artery calcifications. No pericardial effusion. Great vessels are normal in caliber. No evidence of vascular injury. Mild aortic atherosclerosis. Mediastinum/Nodes: No enlarged mediastinal, hilar, or axillary lymph nodes. Thyroid gland, trachea, and esophagus demonstrate no significant findings. Lungs/Pleura: Small right pleural effusion. Mild dependent hazy opacity in the right lower lobe. Focal area of opacity in the anterior aspect of the right lower lobe adjacent to the oblique fissure associated with  a small amount of pleural based air. Minor dependent opacity in the left lower lobe consistent with atelectasis. Mild to moderate centrilobular emphysema. Mild areas of interstitial thickening which appear chronic. No lung mass or nodule. Paraseptal emphysema noted at the lung apices associated with mild pleuroparenchymal scarring. Musculoskeletal: Multiple right-sided rib fractures. There are displaced fractures of the posterior right 6 and seventh ribs associated with pleural base soft tissue swelling and small bubbles of pleural space air, as well as air in the intercostal soft tissues. There are additional nondisplaced fractures of the posterior left eighth and ninth ribs. There are fractures of the lateral right eighth, seventh and sixth ribs. Additional fracture noted along the anterior right sixth rib and anterior seventh rib. The seventh rib anterior fracture associated with soft tissue swelling and adjacent soft tissue air. No other acute fractures.  No bone lesions. CT ABDOMEN PELVIS FINDINGS Hepatobiliary: Normal liver size and attenuation. No contusion or laceration. No mass or focal lesion normal gallbladder. No bile duct dilation. Pancreas: No pancreatic contusion or laceration. No mass or  inflammation. Spleen: Normal spleen size.  No contusion or laceration.  No masses. Adrenals/Urinary Tract: No adrenal mass or hemorrhage. Kidneys normal in size, orientation and position with symmetric enhancement and excretion. No contusion or laceration. Right kidney posterior lower pole 1.4 cm cyst. No other masses, no stones and no hydronephrosis. Normal ureters. Normal bladder. Stomach/Bowel: Subtle haziness in the inferior small bowel mesentery, in the central to upper pelvis, nonspecific. No convincing bowel injury. Small bowel and colon are normal in caliber. No wall thickening or convincing acute inflammation. Normal stomach. Vascular/Lymphatic: No evidence of a vascular injury. Diffuse aortic, iliac and branch vessel atherosclerosis. Reproductive: No acute abnormality. Mildly enlarged prostate, 4.6 cm in greatest transverse dimension. Other: No ascites or hemoperitoneum.  No abdominal wall hernia. Musculoskeletal: No fracture or acute finding.  No bone lesion. IMPRESSION: 1. Multiple right-sided rib fractures as detailed above. Small areas of lung contusion along the anterior aspect of the right lower lobe adjacent to the oblique fissure with a small amount of localized pleural based air. Small right pleural effusion associated with dependent atelectasis, less likely contusion. Minimal pleural base bubbles of air are noted along the non dependent aspect of the right pleural fluid. Small amounts of soft tissue air are seen adjacent to several of the right-sided rib fractures. 2. There is no free pneumothorax. 3. No other acute abnormality within the chest. 4. Coronary artery calcifications and aortic atherosclerosis. 5. Mild to moderate centrilobular emphysema with mild apical paraseptal emphysema. 6. No acute injury or findings within the abdomen or pelvis. Electronically Signed   By: Lajean Manes M.D.   On: 07/11/2019 17:30    Procedures Procedures (including critical care time)  CRITICAL  CARE Performed by: Fredia Sorrow Total critical care time: 30 minutes Critical care time was exclusive of separately billable procedures and treating other patients. Critical care was necessary to treat or prevent imminent or life-threatening deterioration. Critical care was time spent personally by me on the following activities: development of treatment plan with patient and/or surrogate as well as nursing, discussions with consultants, evaluation of patient's response to treatment, examination of patient, obtaining history from patient or surrogate, ordering and performing treatments and interventions, ordering and review of laboratory studies, ordering and review of radiographic studies, pulse oximetry and re-evaluation of patient's condition.   Medications Ordered in ED Medications  0.9 %  sodium chloride infusion ( Intravenous New Bag/Given 07/11/19 1602)  iohexol (  OMNIPAQUE) 300 MG/ML solution 100 mL (100 mLs Intravenous Contrast Given 07/11/19 1648)    ED Course  I have reviewed the triage vital signs and the nursing notes.  Pertinent labs & imaging results that were available during my care of the patient were reviewed by me and considered in my medical decision making (see chart for details).    MDM Rules/Calculators/A&P                          Chest x-ray with ribs on the right side showed rib fractures 5 through 8.  CT head neck chest abdomen and pelvis without any findings other than the rib fractures on the right side and some pulmonary contusion.  No pneumothorax.  No intra-abdominal injury.  With Dr. Arnoldo Morale who felt patient would be safe to go home with incentive spirometer.  Patient nontoxic no acute distress.  Patient not able to take any narcotic pain medicine because he is a prior attic.  And patient refused any kind of pain medicine.   Patient will return for any new or worse symptoms. Final Clinical Impression(s) / ED Diagnoses Final diagnoses:  None     Rx / DC Orders ED Discharge Orders    None       Fredia Sorrow, MD 07/11/19 2031

## 2019-07-11 NOTE — Discharge Instructions (Addendum)
Use the incentive spirometer as directed.  Return for fevers or trouble breathing.  The concern is that the rib fractures can set you up for pneumonia.  Make an appointment to follow-up with your doctor.

## 2019-07-11 NOTE — ED Notes (Signed)
Pt back from ct, pt states that he doesn't want anything for pain at this time. Significant other at bedside

## 2019-08-14 ENCOUNTER — Encounter (HOSPITAL_COMMUNITY): Payer: Self-pay | Admitting: *Deleted

## 2019-08-14 ENCOUNTER — Ambulatory Visit (HOSPITAL_COMMUNITY)
Admission: RE | Admit: 2019-08-14 | Discharge: 2019-08-14 | Disposition: A | Discharge status: Home / Self Care | Payer: Medicare Other | Source: Ambulatory Visit

## 2019-08-14 ENCOUNTER — Ambulatory Visit (INDEPENDENT_AMBULATORY_CARE_PROVIDER_SITE_OTHER): Payer: Medicare Other

## 2019-08-14 ENCOUNTER — Emergency Department (HOSPITAL_COMMUNITY): Payer: Medicare Other

## 2019-08-14 ENCOUNTER — Inpatient Hospital Stay (HOSPITAL_COMMUNITY)
Admission: EM | Admit: 2019-08-14 | Discharge: 2019-08-17 | DRG: 186 | Disposition: A | Discharge status: Home / Self Care | Payer: Medicare Other | Attending: Internal Medicine | Admitting: Internal Medicine

## 2019-08-14 ENCOUNTER — Encounter (HOSPITAL_COMMUNITY): Payer: Self-pay

## 2019-08-14 ENCOUNTER — Inpatient Hospital Stay (HOSPITAL_COMMUNITY): Payer: Medicare Other

## 2019-08-14 ENCOUNTER — Other Ambulatory Visit: Payer: Self-pay

## 2019-08-14 VITALS — BP 98/58 | HR 97 | Temp 98.0°F | Resp 18 | Ht 72.0 in | Wt 137.4 lb

## 2019-08-14 DIAGNOSIS — E871 Hypo-osmolality and hyponatremia: Secondary | ICD-10-CM | POA: Diagnosis present

## 2019-08-14 DIAGNOSIS — J9601 Acute respiratory failure with hypoxia: Secondary | ICD-10-CM | POA: Diagnosis present

## 2019-08-14 DIAGNOSIS — Y848 Other medical procedures as the cause of abnormal reaction of the patient, or of later complication, without mention of misadventure at the time of the procedure: Secondary | ICD-10-CM | POA: Diagnosis present

## 2019-08-14 DIAGNOSIS — S272XXA Traumatic hemopneumothorax, initial encounter: Secondary | ICD-10-CM | POA: Diagnosis present

## 2019-08-14 DIAGNOSIS — Z8249 Family history of ischemic heart disease and other diseases of the circulatory system: Secondary | ICD-10-CM

## 2019-08-14 DIAGNOSIS — J90 Pleural effusion, not elsewhere classified: Secondary | ICD-10-CM | POA: Insufficient documentation

## 2019-08-14 DIAGNOSIS — Z881 Allergy status to other antibiotic agents status: Secondary | ICD-10-CM

## 2019-08-14 DIAGNOSIS — I10 Essential (primary) hypertension: Secondary | ICD-10-CM | POA: Diagnosis present

## 2019-08-14 DIAGNOSIS — S299XXA Unspecified injury of thorax, initial encounter: Secondary | ICD-10-CM | POA: Insufficient documentation

## 2019-08-14 DIAGNOSIS — S2241XD Multiple fractures of ribs, right side, subsequent encounter for fracture with routine healing: Secondary | ICD-10-CM

## 2019-08-14 DIAGNOSIS — I4819 Other persistent atrial fibrillation: Secondary | ICD-10-CM | POA: Diagnosis present

## 2019-08-14 DIAGNOSIS — M1990 Unspecified osteoarthritis, unspecified site: Secondary | ICD-10-CM | POA: Diagnosis present

## 2019-08-14 DIAGNOSIS — Z8679 Personal history of other diseases of the circulatory system: Secondary | ICD-10-CM | POA: Diagnosis not present

## 2019-08-14 DIAGNOSIS — J9811 Atelectasis: Secondary | ICD-10-CM | POA: Diagnosis present

## 2019-08-14 DIAGNOSIS — F17210 Nicotine dependence, cigarettes, uncomplicated: Secondary | ICD-10-CM | POA: Diagnosis present

## 2019-08-14 DIAGNOSIS — Z8615 Personal history of latent tuberculosis infection: Secondary | ICD-10-CM

## 2019-08-14 DIAGNOSIS — M069 Rheumatoid arthritis, unspecified: Secondary | ICD-10-CM | POA: Diagnosis present

## 2019-08-14 DIAGNOSIS — Z4682 Encounter for fitting and adjustment of non-vascular catheter: Secondary | ICD-10-CM

## 2019-08-14 DIAGNOSIS — J189 Pneumonia, unspecified organism: Secondary | ICD-10-CM | POA: Diagnosis not present

## 2019-08-14 DIAGNOSIS — Z20822 Contact with and (suspected) exposure to covid-19: Secondary | ICD-10-CM | POA: Diagnosis present

## 2019-08-14 DIAGNOSIS — Z8619 Personal history of other infectious and parasitic diseases: Secondary | ICD-10-CM

## 2019-08-14 DIAGNOSIS — R0609 Other forms of dyspnea: Secondary | ICD-10-CM

## 2019-08-14 DIAGNOSIS — Z79899 Other long term (current) drug therapy: Secondary | ICD-10-CM

## 2019-08-14 DIAGNOSIS — J9600 Acute respiratory failure, unspecified whether with hypoxia or hypercapnia: Secondary | ICD-10-CM | POA: Diagnosis present

## 2019-08-14 DIAGNOSIS — J439 Emphysema, unspecified: Secondary | ICD-10-CM | POA: Diagnosis present

## 2019-08-14 DIAGNOSIS — R0602 Shortness of breath: Secondary | ICD-10-CM | POA: Diagnosis present

## 2019-08-14 DIAGNOSIS — D638 Anemia in other chronic diseases classified elsewhere: Secondary | ICD-10-CM | POA: Diagnosis present

## 2019-08-14 DIAGNOSIS — J918 Pleural effusion in other conditions classified elsewhere: Secondary | ICD-10-CM

## 2019-08-14 DIAGNOSIS — I351 Nonrheumatic aortic (valve) insufficiency: Secondary | ICD-10-CM | POA: Diagnosis present

## 2019-08-14 DIAGNOSIS — F419 Anxiety disorder, unspecified: Secondary | ICD-10-CM | POA: Diagnosis present

## 2019-08-14 LAB — CBC WITH DIFFERENTIAL/PLATELET
Abs Immature Granulocytes: 0.02 10*3/uL (ref 0.00–0.07)
Basophils Absolute: 0 10*3/uL (ref 0.0–0.1)
Basophils Relative: 1 %
Eosinophils Absolute: 0.2 10*3/uL (ref 0.0–0.5)
Eosinophils Relative: 5 %
HCT: 35.4 % — ABNORMAL LOW (ref 39.0–52.0)
Hemoglobin: 11.7 g/dL — ABNORMAL LOW (ref 13.0–17.0)
Immature Granulocytes: 0 %
Lymphocytes Relative: 26 %
Lymphs Abs: 1.3 10*3/uL (ref 0.7–4.0)
MCH: 31.3 pg (ref 26.0–34.0)
MCHC: 33.1 g/dL (ref 30.0–36.0)
MCV: 94.7 fL (ref 80.0–100.0)
Monocytes Absolute: 0.9 10*3/uL (ref 0.1–1.0)
Monocytes Relative: 17 %
Neutro Abs: 2.7 10*3/uL (ref 1.7–7.7)
Neutrophils Relative %: 51 %
Platelets: 332 10*3/uL (ref 150–400)
RBC: 3.74 MIL/uL — ABNORMAL LOW (ref 4.22–5.81)
RDW: 12.6 % (ref 11.5–15.5)
WBC: 5.2 10*3/uL (ref 4.0–10.5)
nRBC: 0 % (ref 0.0–0.2)

## 2019-08-14 LAB — CBC
HCT: 35.5 % — ABNORMAL LOW (ref 39.0–52.0)
Hemoglobin: 11.9 g/dL — ABNORMAL LOW (ref 13.0–17.0)
MCH: 30.5 pg (ref 26.0–34.0)
MCHC: 33.5 g/dL (ref 30.0–36.0)
MCV: 91 fL (ref 80.0–100.0)
Platelets: 341 10*3/uL (ref 150–400)
RBC: 3.9 MIL/uL — ABNORMAL LOW (ref 4.22–5.81)
RDW: 12.5 % (ref 11.5–15.5)
WBC: 4.9 10*3/uL (ref 4.0–10.5)
nRBC: 0 % (ref 0.0–0.2)

## 2019-08-14 LAB — GRAM STAIN

## 2019-08-14 LAB — OSMOLALITY: Osmolality: 271 mOsm/kg — ABNORMAL LOW (ref 275–295)

## 2019-08-14 LAB — GLUCOSE, PLEURAL OR PERITONEAL FLUID: Glucose, Fluid: <20 mg/dL

## 2019-08-14 LAB — BODY FLUID CELL COUNT WITH DIFFERENTIAL
Eos, Fluid: 7 %
Lymphs, Fluid: 25 %
Monocyte-Macrophage-Serous Fluid: 6 % — ABNORMAL LOW (ref 50–90)
Neutrophil Count, Fluid: 62 % — ABNORMAL HIGH (ref 0–25)
Total Nucleated Cell Count, Fluid: 1900 cu mm — ABNORMAL HIGH (ref 0–1000)

## 2019-08-14 LAB — PROTEIN, TOTAL: Total Protein: 6.3 g/dL — ABNORMAL LOW (ref 6.5–8.1)

## 2019-08-14 LAB — PROTEIN, PLEURAL OR PERITONEAL FLUID: Total protein, fluid: 4.7 g/dL

## 2019-08-14 LAB — BASIC METABOLIC PANEL
Anion gap: 11 (ref 5–15)
BUN: 15 mg/dL (ref 8–23)
CO2: 22 mmol/L (ref 22–32)
Calcium: 8.6 mg/dL — ABNORMAL LOW (ref 8.9–10.3)
Chloride: 95 mmol/L — ABNORMAL LOW (ref 98–111)
Creatinine, Ser: 0.94 mg/dL (ref 0.61–1.24)
GFR calc Af Amer: >60 mL/min (ref >60)
GFR calc non Af Amer: >60 mL/min (ref >60)
Glucose, Bld: 100 mg/dL — ABNORMAL HIGH (ref 70–99)
Potassium: 4.5 mmol/L (ref 3.5–5.1)
Sodium: 128 mmol/L — ABNORMAL LOW (ref 135–145)

## 2019-08-14 LAB — LACTATE DEHYDROGENASE: LDH: 217 U/L — ABNORMAL HIGH (ref 98–192)

## 2019-08-14 LAB — LACTATE DEHYDROGENASE, PLEURAL OR PERITONEAL FLUID: LD, Fluid: 1509 U/L — ABNORMAL HIGH (ref 3–23)

## 2019-08-14 LAB — SARS CORONAVIRUS 2 BY RT PCR (HOSPITAL ORDER, PERFORMED IN ~~LOC~~ HOSPITAL LAB): SARS Coronavirus 2: NEGATIVE

## 2019-08-14 LAB — TROPONIN I (HIGH SENSITIVITY): Troponin I (High Sensitivity): 3 ng/L (ref <18)

## 2019-08-14 MED ORDER — LISINOPRIL 5 MG PO TABS
5.0000 mg | ORAL_TABLET | Freq: Every day | ORAL | Status: DC
  Administered 2019-08-15 – 2019-08-17 (×3): 5 mg via ORAL
  Filled 2019-08-14 (×3): qty 1

## 2019-08-14 MED ORDER — ONDANSETRON HCL 4 MG PO TABS: 4.0000 mg | ORAL_TABLET | Freq: Four times a day (QID) | ORAL | Status: DC | PRN

## 2019-08-14 MED ORDER — LORAZEPAM 0.5 MG PO TABS
0.5000 mg | ORAL_TABLET | Freq: Four times a day (QID) | ORAL | Status: DC | PRN
  Administered 2019-08-15 – 2019-08-17 (×10): 0.5 mg via ORAL
  Filled 2019-08-14 (×10): qty 1

## 2019-08-14 MED ORDER — LORAZEPAM 2 MG/ML IJ SOLN
0.5000 mg | Freq: Once | INTRAMUSCULAR | Status: AC
  Administered 2019-08-14: 0.5 mg via INTRAVENOUS
  Filled 2019-08-14: qty 1

## 2019-08-14 MED ORDER — LIDOCAINE HCL (PF) 1 % IJ SOLN: 5.0000 mL | Freq: Once | INTRAMUSCULAR | Status: AC

## 2019-08-14 MED ORDER — ONDANSETRON HCL 4 MG/2ML IJ SOLN: 4.0000 mg | Freq: Four times a day (QID) | INTRAMUSCULAR | Status: DC | PRN

## 2019-08-14 MED ORDER — LORAZEPAM 2 MG/ML IJ SOLN: 0.5000 mg | Freq: Once | INTRAMUSCULAR | Status: DC

## 2019-08-14 MED ORDER — NICOTINE 14 MG/24HR TD PT24: 14.0000 mg | MEDICATED_PATCH | Freq: Every day | TRANSDERMAL | Status: DC

## 2019-08-14 MED ORDER — SODIUM CHLORIDE 0.9 % IV SOLN
Freq: Once | INTRAVENOUS | Status: AC
  Administered 2019-08-14: 100 mL/h via INTRAVENOUS

## 2019-08-14 MED ORDER — LIDOCAINE HCL (PF) 1 % IJ SOLN
INTRAMUSCULAR | Status: AC
  Administered 2019-08-14: 5 mL
  Filled 2019-08-14: qty 5

## 2019-08-14 MED ORDER — ALBUTEROL SULFATE (2.5 MG/3ML) 0.083% IN NEBU: 2.5000 mg | INHALATION_SOLUTION | RESPIRATORY_TRACT | Status: DC | PRN

## 2019-08-14 MED ORDER — SODIUM CHLORIDE 0.9 % IV BOLUS
500.0000 mL | Freq: Once | INTRAVENOUS | Status: AC
  Administered 2019-08-14: 500 mL via INTRAVENOUS

## 2019-08-14 MED ORDER — CARVEDILOL 6.25 MG PO TABS
6.2500 mg | ORAL_TABLET | Freq: Two times a day (BID) | ORAL | Status: DC
  Administered 2019-08-15 – 2019-08-17 (×5): 6.25 mg via ORAL
  Filled 2019-08-14 (×6): qty 1

## 2019-08-14 MED ORDER — HYDROXYCHLOROQUINE SULFATE 200 MG PO TABS
200.0000 mg | ORAL_TABLET | Freq: Every day | ORAL | Status: DC
  Administered 2019-08-15 – 2019-08-17 (×3): 200 mg via ORAL
  Filled 2019-08-14 (×3): qty 1

## 2019-08-14 MED ORDER — IOHEXOL 350 MG/ML SOLN
60.0000 mL | Freq: Once | INTRAVENOUS | Status: AC | PRN
  Administered 2019-08-14: 60 mL via INTRAVENOUS

## 2019-08-14 NOTE — ED Notes (Signed)
Patient is being discharged from the Urgent Care and sent to the Emergency Department via wheelchair . Per Augusto Gamble, NP, patient is in need of higher level of care due to abnormal chest xray. Patient is aware and verbalizes understanding of plan of care.  Vitals:   08/14/19 1111  BP: (!) 98/58  Pulse: 97  Resp: 18  Temp: 98 F (36.7 C)  SpO2: 99%

## 2019-08-14 NOTE — ED Triage Notes (Signed)
Pt reports having a rib injury 5 weeks ago. Since then he has been having dyspnea on exertion. Pt reports feeling too tired to do anything. Also reports having a loss of appetite.

## 2019-08-14 NOTE — ED Provider Notes (Addendum)
Patient received at shift change from [facility].  Please see his note for full HPI.  In short, patient presented from urgent care with 1 month progressive shortness of breath.  Patient was kicked by horse approximately a month ago, and was noted to have multiple right rib fractures.  He was noted to have a right-sided stable pleural effusion on the right on chest x-ray at urgent care.  Trauma surgery was consulted, and they stated that they were not planning on putting in a chest chest tube.  Patient was then worked up as a shortness of breath work-up.  BMP and CBC without any significant abnormalities, delta troponin was negative.  EKG without significant changes.  Covid was negative.  Care received with CT PE study pending.  Plan was to follow-up on the CT, and consult CT surgery or IR for an outpatient chest tube.  I personally reviewed his CT angio findings which were negative for PE.  And interval development of a large partially loculated RIGHT-sidedpleural effusion with some fluid tracking into the major fissure was noted.  There was also partial collapse of the right lower lobe since the previous exam.  There was also interval development of groundglass opacities in the right upper lobe.  There is concern for infection.  Spoke with Dr. Julien Girt with CT surgery, he states that a chest tube cannot be managed in an outpatient setting and he needs to be brought into the hospital.  He states that a chest tube needs to be placed, and they can follow.  Normally done by IR.  Consulted pulmonary critical care, they will perform the chest tube.  Consulted hospitalist Dr.Kakrakandy , who will admit the patient for further management of his chest tube and close monitoring.  Patient has remained stable with normal vital signs, no increasing shortness of breath, tachypnea, hypoxia, fevers, etc.  No signs of infection or sepsis.  Discussed the case with Dr. Kathrynn Humble who is agreeable to the above plan and  disposition.  Physical Exam  BP (!) 158/67   Pulse 75   Temp 97.8 F (36.6 C) (Oral)   Resp 18   Ht 6' (1.829 m)   Wt 62.3 kg   SpO2 97%   BMI 18.63 kg/m   Physical Exam Vitals and nursing note reviewed.  Constitutional:      General: He is not in acute distress.    Appearance: He is well-developed. He is not ill-appearing, toxic-appearing or diaphoretic.  HENT:     Head: Normocephalic and atraumatic.  Eyes:     Conjunctiva/sclera: Conjunctivae normal.  Cardiovascular:     Rate and Rhythm: Normal rate and regular rhythm.     Heart sounds: No murmur heard.   Pulmonary:     Effort: Pulmonary effort is normal. No respiratory distress.     Breath sounds: Examination of the right-upper field reveals decreased breath sounds and rhonchi. Examination of the right-middle field reveals decreased breath sounds and rhonchi. Examination of the right-lower field reveals decreased breath sounds and rhonchi. Decreased breath sounds and rhonchi present.  Chest:     Chest wall: No tenderness.  Abdominal:     Palpations: Abdomen is soft.     Tenderness: There is no abdominal tenderness.  Musculoskeletal:        General: Normal range of motion.     Cervical back: Normal range of motion and neck supple.  Skin:    General: Skin is warm and dry.     Capillary Refill: Capillary refill  takes less than 2 seconds.     Findings: No erythema.  Neurological:     General: No focal deficit present.     Mental Status: He is alert.  Psychiatric:        Mood and Affect: Mood normal.        Behavior: Behavior normal.     ED Course/Procedures       Results for orders placed or performed during the hospital encounter of 08/14/19  SARS Coronavirus 2 by RT PCR (hospital order, performed in [facility] hospital lab) Nasopharyngeal Nasopharyngeal Swab   Specimen: Nasopharyngeal Swab  Result Value Ref Range   SARS Coronavirus 2 NEGATIVE NEGATIVE  CBC with Differential  Result Value Ref Range    WBC 5.2 4.0 - 10.5 K/uL   RBC 3.74 (L) 4.22 - 5.81 MIL/uL   Hemoglobin 11.7 (L) 13.0 - 17.0 g/dL   HCT 35.4 (L) 39 - 52 %   MCV 94.7 80.0 - 100.0 fL   MCH 31.3 26.0 - 34.0 pg   MCHC 33.1 30.0 - 36.0 g/dL   RDW 12.6 11.5 - 15.5 %   Platelets 332 150 - 400 K/uL   nRBC 0.0 0.0 - 0.2 %   Neutrophils Relative % 51 %   Neutro Abs 2.7 1.7 - 7.7 K/uL   Lymphocytes Relative 26 %   Lymphs Abs 1.3 0.7 - 4.0 K/uL   Monocytes Relative 17 %   Monocytes Absolute 0.9 0 - 1 K/uL   Eosinophils Relative 5 %   Eosinophils Absolute 0.2 0 - 0 K/uL   Basophils Relative 1 %   Basophils Absolute 0.0 0 - 0 K/uL   Immature Granulocytes 0 %   Abs Immature Granulocytes 0.02 0.00 - 0.07 K/uL  Basic metabolic panel  Result Value Ref Range   Sodium 128 (L) 135 - 145 mmol/L   Potassium 4.5 3.5 - 5.1 mmol/L   Chloride 95 (L) 98 - 111 mmol/L   CO2 22 22 - 32 mmol/L   Glucose, Bld 100 (H) 70 - 99 mg/dL   BUN 15 8 - 23 mg/dL   Creatinine, Ser 0.94 0.61 - 1.24 mg/dL   Calcium 8.6 (L) 8.9 - 10.3 mg/dL   GFR calc non Af Amer >60 >60 mL/min   GFR calc Af Amer >60 >60 mL/min   Anion gap 11 5 - 15  Troponin I (High Sensitivity)  Result Value Ref Range   Troponin I (High Sensitivity) 3 <18 ng/L   DG Chest 2 View  Result Date: 08/14/2019 CLINICAL DATA:  Dyspnea on exertion. EXAM: CHEST - 2 VIEW COMPARISON:  July 11, 2019. FINDINGS: The heart size and mediastinal contours are within normal limits. No pneumothorax is noted. Left lung is clear. Moderate right pleural effusion is noted with underlying atelectasis or infiltrate. Right rib fractures are again noted. IMPRESSION: Stable moderate right pleural effusion with underlying atelectasis or infiltrate. Right rib fractures are again noted. Electronically Signed   By: Marijo Conception M.D.   On: 08/14/2019 12:21   CT Angio Chest PE W and/or Wo Contrast  Result Date: 08/14/2019 CLINICAL DATA:  Suspected PE EXAM: CT ANGIOGRAPHY CHEST WITH CONTRAST TECHNIQUE:  Multidetector CT imaging of the chest was performed using the standard protocol during bolus administration of intravenous contrast. Multiplanar CT image reconstructions and MIPs were obtained to evaluate the vascular anatomy. CONTRAST:  62mL OMNIPAQUE IOHEXOL 350 MG/ML SOLN COMPARISON:  July 11, 2019 FINDINGS: Cardiovascular:  No signs of pulmonary embolism. Heart size  is normal with coronary artery disease. No pericardial effusion. Aortic caliber is normal with evidence of calcified and noncalcified atherosclerotic plaque. Limited assessment due to contrast timing. Mediastinum/Nodes: Thoracic inlet structures are normal. No axillary lymphadenopathy. No mediastinal adenopathy.  No hilar adenopathy. Lungs/Pleura: Interval development of a large partially loculated RIGHT-sided pleural effusion with some fluid tracking into the major fissure. Partial collapse of the RIGHT lower lobe since the previous exam. LEFT lung is clear. Small bulla at the RIGHT lung apex is unchanged along with pleural and parenchymal scarring at the apices. Centrilobular emphysema as well. Upper Abdomen: Incidental imaging of upper abdominal contents is unremarkable with limited assessment. Musculoskeletal: Multiple RIGHT-sided rib fractures with overlapping of posterior ribs 6 and 7 on the RIGHT showing a similar appearance. Some callus formation is evident since the prior study. Visualized clavicles and scapulae are unremarkable. Sternum is intact. No LEFT-sided rib fractures. Review of the MIP images confirms the above findings. IMPRESSION: 1. No signs of pulmonary embolism. 2. Interval development of a large partially loculated RIGHT-sided pleural effusion with some fluid tracking into the major fissure. 3. Partial collapse of the RIGHT lower lobe since the previous exam. Interval development also some ground-glass in the RIGHT upper lobe may be related to volume loss secondary to adjacent effusion. Correlate with any signs of infection.  4. Multiple RIGHT-sided rib fractures with overlapping of posterior ribs 6 and 7 on the RIGHT showing a similar appearance. Some callus formation is evident since the prior study. 5. Emphysema and aortic atherosclerosis. Aortic Atherosclerosis (ICD10-I70.0) and Emphysema (ICD10-J43.9). Electronically Signed   By: Zetta Bills M.D.   On: 08/14/2019 17:37     Procedures  MDM        Garald Balding, PA-C 08/14/19 1948    Garald Balding, PA-C 08/15/19 Makoti, Ankit, MD 08/15/19 Vernelle Emerald

## 2019-08-14 NOTE — ED Provider Notes (Signed)
[facility] EMERGENCY DEPARTMENT Provider Note   CSN: [id number] Arrival date & time: 08/14/19  1253     History Chief Complaint  Patient presents with  . Shortness of Breath    Kevin Leon is a 69 y.o. male history of anxiety, aortic insufficiency, hypertension, hepatitis C, A. fib, tobacco use.  Patient presents today from urgent care for shortness of breath and pleural effusion.  History obtained from patient is that he was kicked in the chest by a horse 1 month ago and sustained 4 rib fractures on the right side.  He reports that since that time his right-sided chest pain has gradually resolved he is now chest pain-free but over the past 2-3 weeks he has developed increasing shortness of breath.  Shortness of breath described as a feeling of difficulty catching his breath and needing to rest this is worsened with exertion particularly with walking up stairs.  Additional history obtained from nursing staff is that of trauma surgery is planning to see patient here in the ED for chest tube placement.  Patient denies fever/chills, new injuries, chest pain, hemoptysis, abdominal pain, nausea/vomiting, extremity swelling/color change or any additional concerns.  Patient denies blood thinner use.  HPI     Past Medical History:  Diagnosis Date  . Anxiety   . Aortic insufficiency    Echo 02/2019: EF 60-65, Gr 2 DD, normal wall motion, normal RV SF, mild LAE, trivial MR/TR, mild to moderate AI  . Arthritis   . Essential hypertension 10/04/2015  . Hepatitis C   . History of stress test    ETT-Echo in 2010 was normal  . History of subdural hematoma 08/15/2016  . HTN (hypertension)   . Persistent atrial fibrillation ([place]) 09/09/2015   CHADS2-VASc=2 //  Apixaban //  s/p DCCV 09/14/15 // Monitor 01/2019: NSR, single NSVT (4 beats); single episode of SVT (6 beats)  . Right rotator cuff tear 10/08/2014  . Tobacco abuse 10/04/2015  . Tuberculosis     Patient Active  Problem List   Diagnosis Date Noted  . Aortic insufficiency   . Acute superficial gastritis without hemorrhage 04/04/2017  . Diverticulosis 03/21/2017  . Alcohol consumption heavy 09/18/2016  . History of subdural hematoma 08/15/2016  . Essential hypertension 10/04/2015  . Tobacco abuse 10/04/2015  . Persistent atrial fibrillation ([place]) 09/09/2015  . Right rotator cuff tear 10/08/2014  . Quit smoking within past year 04/02/2014  . GAD (generalized anxiety disorder) 03/31/2013  . Hepatitis C 03/31/2013  . Tinea cruris 03/31/2013    Past Surgical History:  Procedure Laterality Date  . CARDIOVERSION [name] 09/14/2015   Procedure: CARDIOVERSION;  Surgeon: Thayer Headings, MD;  Location: [facility] ENDOSCOPY;  Service: Cardiovascular;  Laterality: [name];  . large piece of wood removed from buttocks     as child  . SHOULDER ARTHROSCOPY WITH ROTATOR CUFF REPAIR Right 10/08/2014   Procedure: RIGHT SHOULDER ARTHROSCOPY, DEBRIDEMENT, ACROMIOPLASTY WITH ROTATOR CUFF REPAIR;  Surgeon: Marchia Bond, MD;  Location: [facility];  Service: Orthopedics;  Laterality: Right;  . TONSILLECTOMY         Family History  Problem Relation Age of Onset  . Hypertension Mother   . Heart attack Neg Hx     Social History   Tobacco Use  . Smoking status: Current Every Day Smoker    Packs/day: 0.50    Types: Cigarettes    Last attempt to quit: 02/15/2014    Years since quitting: 5.4  . Smokeless tobacco: Never  Used  Vaping Use  . Vaping Use: Never used  Substance Use Topics  . Alcohol use: Never    Alcohol/week: 0.0 standard drinks  . 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 Authorizing Provider  carvedilol (COREG) 6.25 MG tablet TAKE 1 TABLET (6.25 MG TOTAL) BY MOUTH 2 (TWO) TIMES DAILY WITH A MEAL. Patient taking differently: Take 6.25 mg by mouth 2 (two)  times daily with a meal.  01/22/19   Weaver, Nicki Reaper T, PA-C  hydroxychloroquine (PLAQUENIL) 200 MG tablet Take 200 mg by mouth daily. 07/01/19   [provider]  lisinopril (ZESTRIL) 20 MG tablet Take 1.5 tablets (30 mg total) by mouth daily. 05/29/19   Richardson Dopp T, PA-C  LORazepam (ATIVAN) 0.5 MG tablet Take 0.5 mg by mouth every 6 (six) hours as needed for anxiety.  04/21/11   [provider]  predniSONE (DELTASONE) 5 MG tablet Take 5 mg by mouth daily with breakfast.  03/30/19   [provider]    Allergies    Isoniazid and Priftin [rifapentine]  Review of Systems   Review of Systems Ten systems are reviewed and are negative for acute change except as noted in the HPI  Physical Exam Updated Vital Signs BP (!) 145/72   Pulse 85   Temp 97.8 F (36.6 C) (Oral)   Resp 22   Ht 6' (1.829 m)   Wt 62.3 kg   SpO2 97%   BMI 18.63 kg/m   Physical Exam Constitutional:      General: He is not in acute distress.    Appearance: Normal appearance. He is well-developed. He is not ill-appearing or diaphoretic.  HENT:     Head: Normocephalic and atraumatic.  Eyes:     General: Vision grossly intact. Gaze aligned appropriately.     Pupils: Pupils are equal, round, and reactive to light.  Neck:     Trachea: Trachea and phonation normal.  Cardiovascular:     Rate and Rhythm: Normal rate and regular rhythm.  Pulmonary:     Effort: Pulmonary effort is normal. No tachypnea, accessory muscle usage or respiratory distress.     Breath sounds: Examination of the right-middle field reveals decreased breath sounds and rhonchi. Examination of the right-lower field reveals decreased breath sounds and rhonchi. Decreased breath sounds and rhonchi present.  Abdominal:     General: There is no distension.     Palpations: Abdomen is soft.     Tenderness: There is no abdominal tenderness. There is no guarding or rebound.  Musculoskeletal:        General: Normal range of motion.      Cervical back: Normal range of motion.     Right lower leg: No tenderness. No edema.     Left lower leg: No tenderness. No edema.  Skin:    General: Skin is warm and dry.  Neurological:     Mental Status: He is alert.     GCS: GCS eye subscore is 4. GCS verbal subscore is 5. GCS motor subscore is 6.     Comments: Speech is clear and goal oriented, follows commands Major Cranial nerves without deficit, no facial droop Moves extremities without ataxia, coordination intact  Psychiatric:        Behavior: Behavior normal.     ED Results / Procedures / Treatments  Labs (all labs ordered are listed, but only abnormal results are displayed) Labs Reviewed  CBC WITH DIFFERENTIAL/PLATELET - Abnormal; Notable for the following components:      Result Value   RBC 3.74 (*)    Hemoglobin 11.7 (*)    HCT 35.4 (*)    All other components within normal limits  SARS CORONAVIRUS 2 BY RT PCR (HOSPITAL ORDER, [facility])  BASIC METABOLIC PANEL    EKG None  Radiology DG Chest 2 View  Result Date: 08/14/2019 CLINICAL DATA:  Dyspnea on exertion. EXAM: CHEST - 2 VIEW COMPARISON:  July 11, 2019. FINDINGS: The heart size and mediastinal contours are within normal limits. No pneumothorax is noted. Left lung is clear. Moderate right pleural effusion is noted with underlying atelectasis or infiltrate. Right rib fractures are again noted. IMPRESSION: Stable moderate right pleural effusion with underlying atelectasis or infiltrate. Right rib fractures are again noted. Electronically Signed   By: Marijo Conception M.D.   On: 08/14/2019 12:21    Procedures Procedures (including critical care time)  Medications Ordered in ED Medications  sodium chloride 0.9 % bolus 500 mL (has no administration in time range)  LORazepam (ATIVAN) injection 0.5 mg (has no administration in time range)    ED Course  I have reviewed the triage vital signs and the nursing notes.  Pertinent labs &  imaging results that were available during my care of the patient were reviewed by me and considered in my medical decision making (see chart for details).    MDM Rules/Calculators/A&P                          Additional history obtained from: 1. Nursing notes from this visit. 2. Family, wife at bedside. Electronic medical record reviewed.  Reviewed patient's urgent care visit from earlier today, diagnosis of pleural effusion following chest trauma and multiple rib fractures of the right side.  X-ray shows stable moderate right pleural effusion with underlying atelectasis or infiltrate.  Urgent care note indicates that they spoke with Dr. Bobbye Morton from trauma surgery who advised patient to go to the ER.  Additionally they noted patient to be tachycardic and hypotensive in their office. - On arrival to the ED patient is well-appearing and in no acute distress.  Cranial nerves intact, no meningeal signs.  Heart regular rate and rhythm.  Right lower and middle lobe with decreased rhonchorous breath sounds.  No abdominal tenderness.  Neurovascular tact all 4 extremities without evidence of DVT.  He is not tachycardic or hypotensive here he is well-appearing/nontoxic.  Will obtain basic labs and Covid test and consult trauma surgery to inform them of patient arrival. - I was informed by nursing staff the trauma surgery would not be evaluating patient down in the ED today and did not plan on putting in a chest tube.  Will obtain troponin and CT chest for evaluation of exertional shortness of breath to rule out infection or other emergent abnormalities.  Patient does report increased anxiety, he reports he takes 0.5 mg Ativan every 6 hours at home.  Blood pressure stable feel it is reasonable to give patient his regular dose of Ativan at this time.  Consult placed to cardiothoracic surgery.  Patient seen and evaluated by Dr. Langston Masker who agrees with work-up. --- 3 PM: Patient reassessed he is resting  comfortably no acute distress reports anxiety resolved following 0.5 mg Ativan.  Well-appearing.  Vital signs are  stable, SPO2 98% on room air.  No hypotension or tachycardia.  Patient is currently awaiting his CT angio as well as the result of his troponin.  Additionally still awaiting cardiothoracic surgery consultation.  Care handoff given to him Masha Belaya PA-C at shift change.  Plan of care is to follow-up on troponin and PE study and to discuss options with CT surgery.  Final disposition per oncoming team.    Note: Portions of this report may have been transcribed using voice recognition software. Every effort was made to ensure accuracy; however, inadvertent computerized transcription errors may still be present. Final Clinical Impression(s) / ED Diagnoses Final diagnoses:  None    Rx / DC Orders ED Discharge Orders    None       Gari Crown 08/14/19 1506    Wyvonnia Dusky, MD 08/14/19 [phone]

## 2019-08-14 NOTE — ED Notes (Signed)
Dr. Bobbye Morton paged @ 1310-per Charge, RN.

## 2019-08-14 NOTE — ED Triage Notes (Signed)
Pt was kicked in right upper abdomen and lower chest by a horse almost one month ago.  Sustained rib fractures.  Urgent care sent down patient here after xray showed large pleural effusion and needs chest tube.  Trauma MD, [place], aware patient is coming down.

## 2019-08-14 NOTE — Procedures (Addendum)
Insertion of Chest Tube Procedure Note  Kevin Leon  [id number]  1950/09/27  Date:08/14/19  Time:8:39 PM    Provider Performing: Otilio Carpen Leon   Procedure: Chest Tube Insertion (40973)  Indication(s) Effusion  Consent Risks of the procedure as well as the alternatives and risks of each were explained to the patient and/or caregiver.  Consent for the procedure was obtained and is signed in the bedside chart  Anesthesia Topical only with 1% lidocaine    Time Out Verified patient identification, verified procedure, site/side was marked, verified correct patient position, special equipment/implants available, medications/allergies/relevant history reviewed, required imaging and test results available.   Sterile Technique Maximal sterile technique including full sterile barrier drape, hand hygiene, sterile gown, sterile gloves, mask, hair covering, sterile ultrasound probe cover (if used).   Procedure Description Ultrasound used to identify appropriate pleural anatomy for placement and overlying skin marked. Area of placement cleaned and draped in sterile fashion.  A 14 French pigtail pleural catheter was placed into the right pleural space using Seldinger technique. Appropriate return of fluid was obtained.  The tube was connected to atrium and placed on -20 cm H2O wall suction.   Complications/Tolerance None; patient tolerated the procedure well. Chest X-ray is ordered to verify placement.   EBL Minimal  Specimen(s) fluid    Kevin Baas R Gleason, PA-C  I was present throughout the procedure and assisted during key parts of the exam including dilation and insertion of chest tube.  Rodman Pickle, M.D. [facility] Pulmonary/Critical Care Medicine

## 2019-08-14 NOTE — ED Notes (Addendum)
Per PA Mickel Baas Gleason RN to continue chest tube suction on low-intermittent

## 2019-08-14 NOTE — H&P (Signed)
History and Physical    Kevin Leon ZOX:[id number] DOB: 12-07-1950 DOA: 08/14/2019  PCP: Vicenta Aly, FNP  Patient coming from: Home.  Chief Complaint: Shortness of breath.  HPI: Kevin Leon is a 69 y.o. male with history of hypertension, tobacco abuse, previous history of A. 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  Over the last few days he started noting increasing shortness of breath and presents to the ER.  Has some subjective feeling of fevers.  No cough or chest pain.  ED Course: In the ER patient was short of breath and CT scan shows right sided loculated pleural effusion for which pulmonary critical care and cardiothoracic surgery were consulted patient had a chest tube placed by pulmonary critical care.  Patient admitted for further observation.  Labs are significant for hemoglobin of 11.7 high-sensitivity of 3 sodium 120 8 repeat chest x-ray after the chest tube does show mild pneumothorax which I discussed with on-call pulmonary critical care and pleural effusion test so far shows LDH of 1500 and glucose of less than 20.  Total nucleated cell count fluid was 1900 neutrophil count of 62%.  Rest of the labs of the pleural effusion is pending.  Review of Systems: As per HPI, rest all negative.   Past Medical History:  Diagnosis Date  . Anxiety   . Aortic insufficiency    Echo 02/2019: EF 60-65, Gr 2 DD, normal wall motion, normal RV SF, mild LAE, trivial MR/TR, mild to moderate AI  . Arthritis   . Essential hypertension 10/04/2015  . Hepatitis C   . History of stress test    ETT-Echo in 2010 was normal  . History of subdural hematoma 08/15/2016  . HTN (hypertension)   . Persistent atrial fibrillation ([place]) 09/09/2015   CHADS2-VASc=2 //  Apixaban //  s/p DCCV 09/14/15 // Monitor 01/2019: NSR, single NSVT (4 beats); single  episode of SVT (6 beats)  . Right rotator cuff tear 10/08/2014  . Tobacco abuse 10/04/2015  . Tuberculosis     Past Surgical History:  Procedure Laterality Date  . CARDIOVERSION [name] 09/14/2015   Procedure: CARDIOVERSION;  Surgeon: Thayer Headings, MD;  Location: [facility] ENDOSCOPY;  Service: Cardiovascular;  Laterality: [name];  . large piece of wood removed from buttocks     as child  . SHOULDER ARTHROSCOPY WITH ROTATOR CUFF REPAIR Right 10/08/2014   Procedure: RIGHT SHOULDER ARTHROSCOPY, DEBRIDEMENT, ACROMIOPLASTY WITH ROTATOR CUFF REPAIR;  Surgeon: Marchia Bond, MD;  Location: [place];  Service: Orthopedics;  Laterality: Right;  . TONSILLECTOMY       reports that he has been smoking cigarettes. He has been smoking about 0.50 packs per day. He has never used smokeless tobacco. He reports that he does not drink alcohol and does not use drugs.  Allergies  Allergen Reactions  . Isoniazid Other (See Comments)    Tremors and very elevated temp  . Priftin [Rifapentine] Other (See Comments)    Tremors and very elevated temp    Family History  Problem Relation Age of Onset  . Hypertension Mother   . Heart attack Neg Hx     Prior to Admission medications   Medication Sig Start Date End Date Taking? Authorizing Provider  acetaminophen (TYLENOL) 325 MG tablet Take 325 mg by mouth every 6 (six) hours as needed for mild pain or headache.  Yes [provider]  carvedilol (COREG) 6.25 MG tablet TAKE 1 TABLET (6.25 MG TOTAL) BY MOUTH 2 (TWO) TIMES DAILY WITH A MEAL. Patient taking differently: Take 6.25 mg by mouth 2 (two) times daily with a meal.  01/22/19  Yes Weaver, Scott T, PA-C  hydroxychloroquine (PLAQUENIL) 200 MG tablet Take 200 mg by mouth daily. 07/01/19  Yes [provider]  lisinopril (ZESTRIL) 5 MG tablet Take 5 mg by mouth daily.   Yes [provider]  LORazepam (ATIVAN) 0.5 MG tablet Take 0.5 mg by mouth every 6 (six) hours.  04/21/11  Yes  [provider]  Polyethyl Glyc-Propyl Glyc PF (SYSTANE ULTRA PF) 0.4-0.3 % SOLN Place 1 drop into both eyes daily as needed (for dryness/irritation).   Yes [provider]  lisinopril (ZESTRIL) 20 MG tablet Take 1.5 tablets (30 mg total) by mouth daily. 05/29/19   Richardson Dopp T, PA-C  predniSONE (DELTASONE) 5 MG tablet Take 5 mg by mouth daily with breakfast.  Patient not taking: Reported on 08/14/2019 03/30/19   [provider]    Physical Exam: Constitutional: Moderately built and nourished. Vitals:   08/14/19 1500 08/14/19 1530 08/14/19 1700 08/14/19 1830  BP: (!) 160/69 (!) 162/64 (!) 158/67   Pulse: 84 80 88 75  Resp: 17 20 21 18   Temp:      TempSrc:      SpO2: 98% 99% 97% 97%  Weight:      Height:       Eyes: Anicteric no pallor. ENMT: No discharge from the ears eyes nose or mouth. Neck: No mass felt.  No neck rigidity. Respiratory: No rhonchi or crepitations. Cardiovascular: S1-S2 heard. Abdomen: Soft nontender bowel sounds present. Musculoskeletal: No edema. Skin: No rash. Neurologic: Alert awake oriented to time place and person.  Moves all extremities. Psychiatric: Appears normal per normal affect.   Labs on Admission: I have personally reviewed following labs and imaging studies  CBC: Recent Labs  Lab 08/14/19 1200 08/14/19 1328  WBC 4.9 5.2  NEUTROABS  --  2.7  HGB 11.9* 11.7*  HCT 35.5* 35.4*  MCV 91.0 94.7  PLT 341 932   Basic Metabolic Panel: Recent Labs  Lab 08/14/19 1328  NA 128*  K 4.5  CL 95*  CO2 22  GLUCOSE 100*  BUN 15  CREATININE 0.94  CALCIUM 8.6*   GFR: Estimated Creatinine Clearance: 65.4 mL/min (by C-G formula based on SCr of 0.94 mg/dL). Liver Function Tests: No results for input(s): AST, ALT, ALKPHOS, BILITOT, PROT, ALBUMIN in the last 168 hours. No results for input(s): LIPASE, AMYLASE in the last 168 hours. No results for input(s): AMMONIA in the last 168 hours. Coagulation Profile: No results  for input(s): INR, PROTIME in the last 168 hours. Cardiac Enzymes: No results for input(s): CKTOTAL, CKMB, CKMBINDEX, TROPONINI in the last 168 hours. BNP (last 3 results) No results for input(s): PROBNP in the last 8760 hours. HbA1C: No results for input(s): HGBA1C in the last 72 hours. CBG: No results for input(s): GLUCAP in the last 168 hours. Lipid Profile: No results for input(s): CHOL, HDL, LDLCALC, TRIG, CHOLHDL, LDLDIRECT in the last 72 hours. Thyroid Function Tests: No results for input(s): TSH, T4TOTAL, FREET4, T3FREE, THYROIDAB in the last 72 hours. Anemia Panel: No results for input(s): VITAMINB12, FOLATE, FERRITIN, TIBC, IRON, RETICCTPCT in the last 72 hours. Urine analysis: No results found for: COLORURINE, APPEARANCEUR, LABSPEC, PHURINE, GLUCOSEU, HGBUR, BILIRUBINUR, KETONESUR, PROTEINUR, UROBILINOGEN, NITRITE, LEUKOCYTESUR Sepsis Labs: @LABRCNTIP (procalcitonin:4,lacticidven:4) ) Recent Results (  from the past 240 hour(s))  SARS Coronavirus 2 by RT PCR (hospital order, performed in [facility] hospital lab) Nasopharyngeal Nasopharyngeal Swab     Status: None   Collection Time: 08/14/19  1:28 PM   Specimen: Nasopharyngeal Swab  Result Value Ref Range Status   SARS Coronavirus 2 NEGATIVE NEGATIVE Final    Comment: (NOTE) SARS-CoV-2 target nucleic acids are NOT DETECTED.  The SARS-CoV-2 RNA is generally detectable in upper and lower respiratory specimens during the acute phase of infection. The lowest concentration of SARS-CoV-2 viral copies this assay can detect is 250 copies / mL. A negative result does not preclude SARS-CoV-2 infection and should not be used as the sole basis for treatment or other patient management decisions.  A negative result may occur with improper specimen collection / handling, submission of specimen other than nasopharyngeal swab, presence of viral mutation(s) within the areas targeted by this assay, and inadequate number of viral  copies (<250 copies / mL). A negative result must be combined with clinical observations, patient history, and epidemiological information.  Fact Sheet for Patients:   StrictlyIdeas.no  Fact Sheet for Healthcare Providers: BankingDealers.co.za  This test is not yet approved or  cleared by the [place] FDA and has been authorized for detection and/or diagnosis of SARS-CoV-2 by FDA under an Emergency Use Authorization (EUA).  This EUA will remain in effect (meaning this test can be used) for the duration of the COVID-19 declaration under Section 564(b)(1) of the Act, 21 U.S.C. section 360bbb-3(b)(1), unless the authorization is terminated or revoked sooner.  Performed at [facility], [place] [address] [place], [place] [address]      Radiological Exams on Admission: DG Chest 2 View  Result Date: 08/14/2019 CLINICAL DATA:  Dyspnea on exertion. EXAM: CHEST - 2 VIEW COMPARISON:  July 11, 2019. FINDINGS: The heart size and mediastinal contours are within normal limits. No pneumothorax is noted. Left lung is clear. Moderate right pleural effusion is noted with underlying atelectasis or infiltrate. Right rib fractures are again noted. IMPRESSION: Stable moderate right pleural effusion with underlying atelectasis or infiltrate. Right rib fractures are again noted. Electronically Signed   By: Marijo Conception M.D.   On: 08/14/2019 12:21   CT Angio Chest PE W and/or Wo Contrast  Result Date: 08/14/2019 CLINICAL DATA:  Suspected PE EXAM: CT ANGIOGRAPHY CHEST WITH CONTRAST TECHNIQUE: Multidetector CT imaging of the chest was performed using the standard protocol during bolus administration of intravenous contrast. Multiplanar CT image reconstructions and MIPs were obtained to evaluate the vascular anatomy. CONTRAST:  69mL OMNIPAQUE IOHEXOL 350 MG/ML SOLN COMPARISON:  July 11, 2019 FINDINGS: Cardiovascular:  No signs of pulmonary embolism. Heart  size is normal with coronary artery disease. No pericardial effusion. Aortic caliber is normal with evidence of calcified and noncalcified atherosclerotic plaque. Limited assessment due to contrast timing. Mediastinum/Nodes: Thoracic inlet structures are normal. No axillary lymphadenopathy. No mediastinal adenopathy.  No hilar adenopathy. Lungs/Pleura: Interval development of a large partially loculated RIGHT-sided pleural effusion with some fluid tracking into the major fissure. Partial collapse of the RIGHT lower lobe since the previous exam. LEFT lung is clear. Small bulla at the RIGHT lung apex is unchanged along with pleural and parenchymal scarring at the apices. Centrilobular emphysema as well. Upper Abdomen: Incidental imaging of upper abdominal contents is unremarkable with limited assessment. Musculoskeletal: Multiple RIGHT-sided rib fractures with overlapping of posterior ribs 6 and 7 on the RIGHT showing a similar appearance. Some callus formation is  evident since the prior study. Visualized clavicles and scapulae are unremarkable. Sternum is intact. No LEFT-sided rib fractures. Review of the MIP images confirms the above findings. IMPRESSION: 1. No signs of pulmonary embolism. 2. Interval development of a large partially loculated RIGHT-sided pleural effusion with some fluid tracking into the major fissure. 3. Partial collapse of the RIGHT lower lobe since the previous exam. Interval development also some ground-glass in the RIGHT upper lobe may be related to volume loss secondary to adjacent effusion. Correlate with any signs of infection. 4. Multiple RIGHT-sided rib fractures with overlapping of posterior ribs 6 and 7 on the RIGHT showing a similar appearance. Some callus formation is evident since the prior study. 5. Emphysema and aortic atherosclerosis. Aortic Atherosclerosis (ICD10-I70.0) and Emphysema (ICD10-J43.9). Electronically Signed   By: Zetta Bills M.D.   On: 08/14/2019 17:37   DG  Chest Portable 1 View  Result Date: 08/14/2019 CLINICAL DATA:  Placement of chest tube EXAM: PORTABLE CHEST 1 VIEW COMPARISON:  CT same day 4:47 p.m. FINDINGS: The heart size and mediastinal contours are within normal limits. Aortic knob calcifications are seen. There is been interval placement of pigtail catheter within the right lower lung. There is a new small right pneumothorax present. No mediastinal shift. Hazy layering moderate pleural effusion seen at the right lung base. The left lung is clear. IMPRESSION: Interval placement of right-sided pigtail catheter. New small right pneumothorax. Small to moderate loculated right effusion. These results were called by telephone at the time of interpretation on 08/14/2019 at 9:16 pm to provider [facility] , who verbally acknowledged these results. Electronically Signed   By: Prudencio Pair M.D.   On: 08/14/2019 21:22    EKG: Independently reviewed.  Normal sinus rhythm.  Assessment/Plan Principal Problem:   Acute respiratory failure (HCC) Active Problems:   Essential hypertension   Loculated pleural effusion   Rheumatoid arthritis (HCC)   Acute respiratory failure with hypoxia (HCC)    1. Right-sided loculated pleural effusion status post chest tube placement by pulmonary critical care -likely caused by recent trauma.  However patient does have history of latent tuberculosis.  Pleural fluid has been sent for Gram staining and AFB staining and cultures.  Patient is afebrile at this time.  Discussed with on-call pulmonary critical care at this time no antibiotics have been started. 2. Hyponatremia cause not clear.  Will check urine sodium serum osmolality urine osmolality and frequent metabolic panel and have further plans. 3. Hypertension on lisinopril and beta-blockers. 4. History of rheumatoid arthritis on Plaquenil. 5. Anemia follow CBC.  Check anemia panel with next blood draw. 6. Tobacco abuse -tobacco cessation counseling  requested. 7. Previous history of hepatitis C and drug abuse as per the chart.  Since patient has loculated pleural effusion with post chest tube placement pneumothorax will need close monitoring for any further deterioration in inpatient status.   DVT prophylaxis: SCDs for now.  In anticipation of possible procedure will avoid any anticoagulation for now. Code Status: Full code. Family Communication: Patient's wife at the bedside. Disposition Plan: Home. Consults called: Pulmonary critical care. Admission status: Inpatient.   Rise Patience MD [facility] Pager [phone].  If 7PM-7AM, please contact night-coverage [email or web address] Password [facility]  08/14/2019, 9:58 PM

## 2019-08-14 NOTE — Consult Note (Signed)
NAME:  Kevin Leon, MRN:  [id number], DOB:  10/31/1950, LOS: 0 ADMISSION DATE:  08/14/2019, CONSULTATION DATE: 08/14/2019 REFERRING MD:  Rise Patience, MD CHIEF COMPLAINT: Pleural effusion  Brief History   69 year old male with recent chest trauma secondary to injury inflicted by horse kick with resultant rib fractures who presents with shortness of breath and found with large partially loculated pleural effusion. PCCM consulted for chest tube placement and management.  History of present illness   Mr. Kevin Leon is a 69 year old male pmhx as noted below with recent chest trauma secondary to injury inflicted by horse kick with resultant rib fractures who presents with shortness of breath and found with large partially loculated pleural effusion. Five weeks ago, he was kicked in the right chest by a horse and fell and hit his head. No LOC reported. He presented to urgent care at the time and diagnosed with right-sided rib fractures and pulmonary contusion with small right pleural effusion. In the last week, he reports worsening shortness of breath with activity and speaking. Endorses low-grade fevers, fatigue and loss of appetite. Due to these symptoms and worsening CXR, he presented to urgent care and was advised to go to [facility] ED for evaluation.   Past Medical History  RA, prior IVDU (36 years ago), EtOH abuse (last drink 8 months ago), hepatitis C s/p treatment, latent TB s/p 2 months treatment, atrial fibrillation s/p DCCV 08/2015, HTN, anxiety  Significant Hospital Events   7/30 - Admit, chest tube placement  Consults:  PCCM  Procedures:  7/30 - Right chest tube  Significant Diagnostic Tests:  7/30 CTA - No PE. Large partially loculated right-sided effusion with RLL atelectasis, 6th and 7th rib fracture, background emphysema  Micro Data:  Pleural culture 7/30  Antimicrobials:    Interim history/subjective:  As above  Objective   Blood pressure (!) 158/67, pulse 75,  temperature 97.8 F (36.6 C), temperature source Oral, resp. rate 18, height 6' (1.829 m), weight 62.3 kg, SpO2 97 %.       No intake or output data in the 24 hours ending 08/14/19 1928 Filed Weights   08/14/19 1319  Weight: 62.3 kg   Physical Exam: General: Elderly thin male-appearing, no acute distress HENT: , AT, OP clear, MMM Eyes: EOMI, no scleral icterus Respiratory: Absent right mid-breath sounds, no wheezing or rhonchi Cardiovascular: RRR, -M/R/G, no JVD GI: BS+, soft, nontender Extremities:-Edema,-tenderness Neuro: AAO x4, CNII-XII grossly intact Skin: Improved right chest bruising Psych: Normal mood, normal affect  Resolved Hospital Problem list     Assessment & Plan:   Right pleural effusion/Suspected hemothorax secondary to blunt force trauma History suggests blunt force trauma as etiology. Of note, patient does have history of latent TB without treatment, RA  --Chest tube placed on 7/30 with 200cc drained --CT on suction --CXR in am --Will follow-up pleural studies: Cell count, LDH, protein, glucose, culture, AFB, cytology  Emphysema --Albuterol PRN --Will need outpatient PFTs  Best practice:  Diet: Per primary Pain/Anxiety/Delirium protocol (if indicated): -- VAP protocol (if indicated): -- DVT prophylaxis: Per primary GI prophylaxis: -- Glucose control: Per primary Mobility: As tolerated Code Status: Full code Family Communication: Updated patient and wife at bedside on 7/30.  Disposition: Admit to [facility]  Labs   CBC: Recent Labs  Lab 08/14/19 1200 08/14/19 1328  WBC 4.9 5.2  NEUTROABS  --  2.7  HGB 11.9* 11.7*  HCT 35.5* 35.4*  MCV 91.0 94.7  PLT 341 332    Basic  Metabolic Panel: Recent Labs  Lab 08/14/19 1328  NA 128*  K 4.5  CL 95*  CO2 22  GLUCOSE 100*  BUN 15  CREATININE 0.94  CALCIUM 8.6*   GFR: Estimated Creatinine Clearance: 65.4 mL/min (by C-G formula based on SCr of 0.94 mg/dL). Recent Labs  Lab 08/14/19 1200  08/14/19 1328  WBC 4.9 5.2    Liver Function Tests: No results for input(s): AST, ALT, ALKPHOS, BILITOT, PROT, ALBUMIN in the last 168 hours. No results for input(s): LIPASE, AMYLASE in the last 168 hours. No results for input(s): AMMONIA in the last 168 hours.  ABG    Component Value Date/Time   TCO2 29 07/27/2007 1031     Coagulation Profile: No results for input(s): INR, PROTIME in the last 168 hours.  Cardiac Enzymes: No results for input(s): CKTOTAL, CKMB, CKMBINDEX, TROPONINI in the last 168 hours.  HbA1C: No results found for: HGBA1C  CBG: No results for input(s): GLUCAP in the last 168 hours.  Review of Systems:   Review of Systems  Constitutional: Positive for fever, malaise/fatigue and weight loss. Negative for chills and diaphoresis.  HENT: Negative for congestion, ear pain and sore throat.   Respiratory: Positive for shortness of breath. Negative for cough, hemoptysis, sputum production and wheezing.   Cardiovascular: Negative for chest pain, palpitations and leg swelling.  Gastrointestinal: Negative for abdominal pain, heartburn and nausea.  Genitourinary: Negative for frequency.  Musculoskeletal: Negative for joint pain and myalgias.  Skin: Negative for itching and rash.  Neurological: Negative for dizziness, weakness and headaches.  Endo/Heme/Allergies: Does not bruise/bleed easily.  Psychiatric/Behavioral: Negative for depression. The patient is not nervous/anxious.      Past Medical History  He,  has a past medical history of Anxiety, Aortic insufficiency, Arthritis, Essential hypertension (10/04/2015), Hepatitis C, History of stress test, History of subdural hematoma (08/15/2016), HTN (hypertension), Persistent atrial fibrillation ([place]) (09/09/2015), Right rotator cuff tear (10/08/2014), Tobacco abuse (10/04/2015), and Tuberculosis.   Surgical History    Past Surgical History:  Procedure Laterality Date  . CARDIOVERSION [name] 09/14/2015   Procedure:  CARDIOVERSION;  Surgeon: Thayer Headings, MD;  Location: [facility] ENDOSCOPY;  Service: Cardiovascular;  Laterality: [name];  . large piece of wood removed from buttocks     as child  . SHOULDER ARTHROSCOPY WITH ROTATOR CUFF REPAIR Right 10/08/2014   Procedure: RIGHT SHOULDER ARTHROSCOPY, DEBRIDEMENT, ACROMIOPLASTY WITH ROTATOR CUFF REPAIR;  Surgeon: Marchia Bond, MD;  Location: [place];  Service: Orthopedics;  Laterality: Right;  . TONSILLECTOMY       Social History   reports that he has been smoking cigarettes. He has been smoking about 0.50 packs per day. He has never used smokeless tobacco. He reports that he does not drink alcohol and does not use drugs.   Family History   His family history includes Hypertension in his mother. There is no history of Heart attack.   Allergies Allergies  Allergen Reactions  . Isoniazid Other (See Comments)    Tremors and very elevated temp  . Priftin [Rifapentine] Other (See Comments)    Tremors and very elevated temp     Home Medications  Prior to Admission medications   Medication Sig Start Date End Date Taking? Authorizing Provider  acetaminophen (TYLENOL) 325 MG tablet Take 325 mg by mouth every 6 (six) hours as needed for mild pain or headache.   Yes [provider]  carvedilol (COREG) 6.25 MG tablet TAKE 1 TABLET (6.25 MG TOTAL) BY MOUTH 2 (TWO)  TIMES DAILY WITH A MEAL. Patient taking differently: Take 6.25 mg by mouth 2 (two) times daily with a meal.  01/22/19  Yes Weaver, Scott T, PA-C  hydroxychloroquine (PLAQUENIL) 200 MG tablet Take 200 mg by mouth daily. 07/01/19  Yes [provider]  lisinopril (ZESTRIL) 5 MG tablet Take 5 mg by mouth daily.   Yes [provider]  LORazepam (ATIVAN) 0.5 MG tablet Take 0.5 mg by mouth every 6 (six) hours.  04/21/11  Yes [provider]  Polyethyl Glyc-Propyl Glyc PF (SYSTANE ULTRA PF) 0.4-0.3 % SOLN Place 1 drop into both eyes daily as needed (for  dryness/irritation).   Yes [provider]  lisinopril (ZESTRIL) 20 MG tablet Take 1.5 tablets (30 mg total) by mouth daily. 05/29/19   Richardson Dopp T, PA-C  predniSONE (DELTASONE) 5 MG tablet Take 5 mg by mouth daily with breakfast.  Patient not taking: Reported on 08/14/2019 03/30/19   [provider]     Care time: 60 min    This time reflects time of care of this signee Dr. Rodman Pickle including discussion of care with patient and family. Coordinated care with ED RN.   Rodman Pickle, M.D. [facility] Pulmonary/Critical Care Medicine 08/14/2019 11:26 PM   Please see Amion for pager number to reach on-call Pulmonary and Critical Care Team.

## 2019-08-14 NOTE — Discharge Instructions (Signed)
GO TO THE ER NOW, as directed by trauma surgery

## 2019-08-14 NOTE — ED Provider Notes (Signed)
[facility]    CSN: [id number] Arrival date & time: 08/14/19  1051      History   Chief Complaint Chief Complaint  Patient presents with   Appointment   Rib Injury    HPI Kevin Leon is a 69 y.o. male.   Kevin Leon presents with complaints of shortness of breath, right upper back pain, dizziness/ lightheadedness. He was kicked by a horse on 6/26 and went to the ER. Multiple rib fractures seen on ct scan. His bruising and anterior chest wall pain had started to improve however his current symptoms have been worsening. He has noticed his blood pressure trending down so has been decreasing his bp medication, only taking 5mg  of lisinopril currently. No chest pain . Not on a blood thinner.     ROS per HPI, negative if not otherwise mentioned.      Past Medical History:  Diagnosis Date   Anxiety    Aortic insufficiency    Echo 02/2019: EF 60-65, Gr 2 DD, normal wall motion, normal RV SF, mild LAE, trivial MR/TR, mild to moderate AI   Arthritis    Essential hypertension 10/04/2015   Hepatitis C    History of stress test    ETT-Echo in 2010 was normal   History of subdural hematoma 08/15/2016   HTN (hypertension)    Persistent atrial fibrillation ([place]) 09/09/2015   CHADS2-VASc=2 //  Apixaban //  s/p DCCV 09/14/15 // Monitor 01/2019: NSR, single NSVT (4 beats); single episode of SVT (6 beats)   Right rotator cuff tear 10/08/2014   Tobacco abuse 10/04/2015   Tuberculosis     Patient Active Problem List   Diagnosis Date Noted   Aortic insufficiency    Acute superficial gastritis without hemorrhage 04/04/2017   Diverticulosis 03/21/2017   Alcohol consumption heavy 09/18/2016   History of subdural hematoma 08/15/2016   Essential hypertension 10/04/2015   Tobacco abuse 10/04/2015   Persistent atrial fibrillation ([place]) 09/09/2015   Right rotator cuff tear 10/08/2014   Quit smoking within past year 04/02/2014   GAD (generalized anxiety  disorder) 03/31/2013   Hepatitis C 03/31/2013   Tinea cruris 03/31/2013    Past Surgical History:  Procedure Laterality Date   CARDIOVERSION [name] 09/14/2015   Procedure: CARDIOVERSION;  Surgeon: Thayer Headings, MD;  Location: [facility];  Service: Cardiovascular;  Laterality: [name];   large piece of wood removed from buttocks     as child   SHOULDER ARTHROSCOPY WITH ROTATOR CUFF REPAIR Right 10/08/2014   Procedure: RIGHT SHOULDER ARTHROSCOPY, DEBRIDEMENT, ACROMIOPLASTY WITH ROTATOR CUFF REPAIR;  Surgeon: Marchia Bond, MD;  Location: [facility];  Service: Orthopedics;  Laterality: Right;   TONSILLECTOMY         Home Medications    Prior to Admission medications   Medication Sig Start Date End Date Taking? Authorizing Provider  carvedilol (COREG) 6.25 MG tablet TAKE 1 TABLET (6.25 MG TOTAL) BY MOUTH 2 (TWO) TIMES DAILY WITH A MEAL. Patient taking differently: Take 6.25 mg by mouth 2 (two) times daily with a meal.  01/22/19   Weaver, Nicki Reaper T, PA-C  hydroxychloroquine (PLAQUENIL) 200 MG tablet Take 200 mg by mouth daily. 07/01/19   [provider]  lisinopril (ZESTRIL) 20 MG tablet Take 1.5 tablets (30 mg total) by mouth daily. 05/29/19   Richardson Dopp T, PA-C  LORazepam (ATIVAN) 0.5 MG tablet Take 0.5 mg by mouth every 6 (six) hours as needed for anxiety.  04/21/11   [provider]  predniSONE (DELTASONE) 5 MG tablet Take 5 mg by mouth daily with breakfast.  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 --      Excl. in [place]? --    No data found.  Updated Vital Signs BP (!) 98/58    Pulse 97    Temp 98 F (36.7 C) (Oral)    Resp 18    Ht 6' (1.829 m)    Wt 137 lb 6.4 oz (62.3 kg)    SpO2 99%    BMI 18.63 kg/m   Visual Acuity Right Eye Distance:   Left Eye Distance:   Bilateral Distance:    Right Eye Near:   Left Eye Near:    Bilateral Near:     Physical Exam Constitutional:      Appearance: He is well-developed.  Cardiovascular:     Rate and Rhythm: Normal rate.  Pulmonary:     Effort: Pulmonary effort is normal.     Breath sounds: Examination of the right-middle field reveals decreased breath sounds. Examination of the right-lower field reveals decreased breath sounds. Decreased breath sounds present.  Musculoskeletal:     Thoracic back: Tenderness present.       Back:  Skin:    General: Skin is warm and dry.  Neurological:     Mental Status: He is alert and oriented to person, place, and time.     EKG:  Sinus tach . Previous EKG was available for review. No stwave changes as interpreted by me.   UC Treatments / Results  Labs (all labs ordered are listed, but only abnormal results are displayed) Labs Reviewed  CBC - Abnormal; Notable for the following components:      Result Value   RBC 3.90 (*)    Hemoglobin 11.9 (*)    HCT 35.5 (*)      All other components within normal limits    EKG  Radiology DG Chest 2 View  Result Date: 08/14/2019 CLINICAL DATA:  Dyspnea on exertion. EXAM: CHEST - 2 VIEW COMPARISON:  July 11, 2019. FINDINGS: The heart size and mediastinal contours are within normal limits. No pneumothorax is noted. Left lung is clear. Moderate right pleural effusion is noted with underlying atelectasis or infiltrate. Right rib fractures are again noted. IMPRESSION: Stable moderate right pleural effusion with underlying atelectasis or infiltrate. Right rib fractures are again noted. Electronically Signed   By: Marijo Conception M.D.   On: 08/14/2019 12:21    Procedures Procedures (including critical care time)  Medications Ordered in UC Medications - No data to display  Initial Impression / Assessment and Plan / UC Course  I have reviewed the triage vital signs and the nursing notes.  Pertinent labs & imaging results that were available during my care of the patient were reviewed by me and considered in my medical decision making (see chart for details).    Hypotensive, tachycardic. Effusion on chest xray today, s/p horse kick to check wall on 6/26 with known rib fractures. Call to trauma surgery, dr. Bobbye Morton- to go to er now. Patient notified. Peripheral iv placed, cbc pending, recommended assistance by RN and wheelchair now. Patient verbalized understanding and agreeable to plan.    Final Clinical Impressions(s) / UC Diagnoses   Final diagnoses:  Pleural effusion  Closed fracture of multiple ribs of right side with routine healing, subsequent encounter  Trauma of chest, initial encounter     Discharge Instructions     GO TO THE ER NOW, as directed by trauma surgery    ED Prescriptions    None     PDMP not reviewed this encounter.   Zigmund Gottron, NP 08/14/19 1250

## 2019-08-15 ENCOUNTER — Inpatient Hospital Stay (HOSPITAL_COMMUNITY): Payer: Medicare Other

## 2019-08-15 DIAGNOSIS — M069 Rheumatoid arthritis, unspecified: Secondary | ICD-10-CM

## 2019-08-15 DIAGNOSIS — I10 Essential (primary) hypertension: Secondary | ICD-10-CM

## 2019-08-15 DIAGNOSIS — J90 Pleural effusion, not elsewhere classified: Principal | ICD-10-CM

## 2019-08-15 LAB — CBC
HCT: 33.6 % — ABNORMAL LOW (ref 39.0–52.0)
Hemoglobin: 11.3 g/dL — ABNORMAL LOW (ref 13.0–17.0)
MCH: 31.1 pg (ref 26.0–34.0)
MCHC: 33.6 g/dL (ref 30.0–36.0)
MCV: 92.6 fL (ref 80.0–100.0)
Platelets: 317 10*3/uL (ref 150–400)
RBC: 3.63 MIL/uL — ABNORMAL LOW (ref 4.22–5.81)
RDW: 12.4 % (ref 11.5–15.5)
WBC: 4.5 10*3/uL (ref 4.0–10.5)
nRBC: 0 % (ref 0.0–0.2)

## 2019-08-15 LAB — BASIC METABOLIC PANEL
Anion gap: 10 (ref 5–15)
BUN: 12 mg/dL (ref 8–23)
CO2: 21 mmol/L — ABNORMAL LOW (ref 22–32)
Calcium: 8.5 mg/dL — ABNORMAL LOW (ref 8.9–10.3)
Chloride: 97 mmol/L — ABNORMAL LOW (ref 98–111)
Creatinine, Ser: 0.96 mg/dL (ref 0.61–1.24)
GFR calc Af Amer: >60 mL/min (ref >60)
GFR calc non Af Amer: >60 mL/min (ref >60)
Glucose, Bld: 72 mg/dL (ref 70–99)
Potassium: 4.3 mmol/L (ref 3.5–5.1)
Sodium: 128 mmol/L — ABNORMAL LOW (ref 135–145)

## 2019-08-15 LAB — HIV ANTIBODY (ROUTINE TESTING W REFLEX): HIV Screen 4th Generation wRfx: NONREACTIVE

## 2019-08-15 LAB — SODIUM, URINE, RANDOM: Sodium, Ur: 90 mmol/L

## 2019-08-15 MED ORDER — SODIUM CHLORIDE 0.9 % IV SOLN: INTRAVENOUS | Status: DC

## 2019-08-15 MED ORDER — GUAIFENESIN ER 600 MG PO TB12
600.0000 mg | ORAL_TABLET | Freq: Two times a day (BID) | ORAL | Status: DC | PRN
  Administered 2019-08-15: 600 mg via ORAL
  Filled 2019-08-15: qty 1

## 2019-08-15 NOTE — Progress Notes (Signed)
Patient ID: Kevin Leon, male   DOB: [birth year], 69 y.o.   MRN: [id number]  PROGRESS NOTE    Kevin Leon  JIR:[id number] DOB: [birth year] DOA: 08/14/2019 PCP: Vicenta Aly, FNP   Brief Narrative:  69 y.o. male with history of hypertension, tobacco abuse, previous history of paroxysmal A. 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  In the ED, CT scan of chest showed right sided loculated pleural effusion for which pulmonary critical care was consulted patient had a chest tube placed by pulmonary critical care.  Assessment & Plan:   Right-sided loculated pleural effusion -Probably due to recent chest trauma.  Unlikely that this is tubercular pleural effusion -Status post right-sided chest tube placement by pulmonary.  Follow-up pleural fluid analysis and further pulmonary recommendations.  Currently not on any antibiotics.  Hyponatremia -Sodium still 128.  We will give him gentle hydration.  Hypertension -Blood pressure stable.  Continue lisinopril and Coreg  History of rheumatoid arthritis -Continue Plaquenil  Anemia of chronic disease -Probably from rheumatoid arthritis.  Hemoglobin stable.  Monitor  Tobacco abuse  -Patient will need to work on tobacco cessation   DVT prophylaxis: SCDs Code Status: Full Family Communication: None at bedside Disposition Plan: Status is: Inpatient  Remains inpatient appropriate because:Inpatient level of care appropriate due to severity of illness   Dispo: The patient is from: Home              Anticipated d/c is to: Home              Anticipated d/c date is: 2 days              Patient currently is not medically stable to d/c.   Consultants: Pulmonary  Procedures: Right-sided chest tube placement by pulmonary on  08/14/2019  Antimicrobials: None   Subjective: Patient seen and examined at bedside.  Denies worsening shortness of breath.  No overnight fever, nausea, vomiting or chest pain reported  Objective: Vitals:   08/15/19 0040 08/15/19 0122 08/15/19 0721 08/15/19 0800  BP: (!) 127/60 (!) 147/74 (!) 148/67 (!) 148/67  Pulse: 79 84 88   Resp: 19 19 18    Temp:  99 F (37.2 C) 98.5 F (36.9 C)   TempSrc:  Oral Oral   SpO2: 93% 95% 95%   Weight:  64.8 kg    Height:  6' (1.829 m)      Intake/Output Summary (Last 24 hours) at 08/15/2019 1049 Last data filed at 08/15/2019 0753 Gross per 24 hour  Intake 240 ml  Output 320 ml  Net -80 ml   Filed Weights   08/14/19 1319 08/15/19 0122  Weight: 62.3 kg 64.8 kg    Examination:  General exam: Appears calm and comfortable  Respiratory system: Bilateral decreased breath sounds at bases with some scattered crackles.  Right-sided chest tube present Cardiovascular system: S1 & S2 heard, Rate controlled Gastrointestinal system: Abdomen is nondistended, soft and nontender. Normal bowel sounds heard. Extremities: No cyanosis, clubbing, edema  Central nervous system: Alert and oriented. No focal neurological deficits. Moving extremities Skin: No rashes, lesions or ulcers Psychiatry: Flat affect    Data Reviewed: I have personally reviewed following labs and imaging studies  CBC: Recent Labs  Lab 08/14/19 1200 08/14/19 1328 08/15/19 0235  WBC 4.9 5.2 4.5  NEUTROABS  --  2.7  --   HGB 11.9* 11.7* 11.3*  HCT 35.5* 35.4* 33.6*  MCV 91.0 94.7 92.6  PLT 341 332 694   Basic Metabolic Panel: Recent Labs  Lab 08/14/19 1328 08/15/19 0235  NA 128* 128*  K 4.5 4.3  CL 95* 97*  CO2 22 21*  GLUCOSE 100* 72  BUN 15 12  CREATININE 0.94 0.96  CALCIUM 8.6* 8.5*   GFR: Estimated Creatinine Clearance: 66.6 mL/min (by C-G formula based on SCr of 0.96 mg/dL). Liver Function Tests: Recent Labs  Lab 08/14/19 2222  PROT 6.3*   No results  for input(s): LIPASE, AMYLASE in the last 168 hours. No results for input(s): AMMONIA in the last 168 hours. Coagulation Profile: No results for input(s): INR, PROTIME in the last 168 hours. Cardiac Enzymes: No results for input(s): CKTOTAL, CKMB, CKMBINDEX, TROPONINI in the last 168 hours. BNP (last 3 results) No results for input(s): PROBNP in the last 8760 hours. HbA1C: No results for input(s): HGBA1C in the last 72 hours. CBG: No results for input(s): GLUCAP in the last 168 hours. Lipid Profile: No results for input(s): CHOL, HDL, LDLCALC, TRIG, CHOLHDL, LDLDIRECT in the last 72 hours. Thyroid Function Tests: No results for input(s): TSH, T4TOTAL, FREET4, T3FREE, THYROIDAB in the last 72 hours. Anemia Panel: No results for input(s): VITAMINB12, FOLATE, FERRITIN, TIBC, IRON, RETICCTPCT in the last 72 hours. Sepsis Labs: No results for input(s): PROCALCITON, LATICACIDVEN in the last 168 hours.  Recent Results (from the past 240 hour(s))  SARS Coronavirus 2 by RT PCR (hospital order, performed in [facility] hospital lab) Nasopharyngeal Nasopharyngeal Swab     Status: None   Collection Time: 08/14/19  1:28 PM   Specimen: Nasopharyngeal Swab  Result Value Ref Range Status   SARS Coronavirus 2 NEGATIVE NEGATIVE Final    Comment: (NOTE) SARS-CoV-2 target nucleic acids are NOT DETECTED.  The SARS-CoV-2 RNA is generally detectable in upper and lower respiratory specimens during the acute phase of infection. The lowest concentration of SARS-CoV-2 viral copies this assay can detect is 250 copies / mL. A negative result does not preclude SARS-CoV-2 infection and should not be used as the sole basis for treatment or other patient management decisions.  A negative result may occur with improper specimen collection / handling, submission of specimen other than nasopharyngeal swab, presence of viral mutation(s) within the areas targeted by this assay, and inadequate number of viral  copies (<250 copies / mL). A negative result must be combined with clinical observations, patient history, and epidemiological information.  Fact Sheet for Patients:   StrictlyIdeas.no  Fact Sheet for Healthcare Providers: BankingDealers.co.za  This test is not yet approved or  cleared by the [place] FDA and has been authorized for detection and/or diagnosis of SARS-CoV-2 by FDA under an Emergency Use Authorization (EUA).  This EUA will remain in effect (meaning this test can be used) for the duration of the COVID-19 declaration under Section 564(b)(1) of the Act, 21 U.S.C. section 360bbb-3(b)(1), unless the authorization is terminated or revoked sooner.  Performed at [facility], [place] [address], [place], [place] [address]   Culture, body fluid-bottle     Status: None (Preliminary result)   Collection Time: 08/14/19  8:35 PM   Specimen: Fluid  Result Value Ref Range Status   Specimen Description FLUID PLEURAL  Final   Special Requests   Final    BOTTLES DRAWN AEROBIC AND ANAEROBIC Blood Culture adequate volume   Culture   Final  NO GROWTH < 12 HOURS Performed at [facility] [address] [place], [place] [address]    Report Status PENDING  Incomplete  Gram stain     Status: None   Collection Time: 08/14/19  8:35 PM   Specimen: Fluid  Result Value Ref Range Status   Specimen Description FLUID PLEURAL  Final   Special Requests NONE  Final   Gram Stain   Final    RARE WBC PRESENT,BOTH PMN AND MONONUCLEAR NO ORGANISMS SEEN Performed at [facility], [address], [place], [place] [address]    Report Status 08/14/2019 FINAL  Final         Radiology Studies: DG Chest 2 View  Result Date: 08/14/2019 CLINICAL DATA:  Dyspnea on exertion. EXAM: CHEST - 2 VIEW COMPARISON:  July 11, 2019. FINDINGS: The heart size and mediastinal contours are within normal limits. No pneumothorax is noted. Left  lung is clear. Moderate right pleural effusion is noted with underlying atelectasis or infiltrate. Right rib fractures are again noted. IMPRESSION: Stable moderate right pleural effusion with underlying atelectasis or infiltrate. Right rib fractures are again noted. Electronically Signed   By: Marijo Conception M.D.   On: 08/14/2019 12:21   CT Angio Chest PE W and/or Wo Contrast  Result Date: 08/14/2019 CLINICAL DATA:  Suspected PE EXAM: CT ANGIOGRAPHY CHEST WITH CONTRAST TECHNIQUE: Multidetector CT imaging of the chest was performed using the standard protocol during bolus administration of intravenous contrast. Multiplanar CT image reconstructions and MIPs were obtained to evaluate the vascular anatomy. CONTRAST:  7mL OMNIPAQUE IOHEXOL 350 MG/ML SOLN COMPARISON:  July 11, 2019 FINDINGS: Cardiovascular:  No signs of pulmonary embolism. Heart size is normal with coronary artery disease. No pericardial effusion. Aortic caliber is normal with evidence of calcified and noncalcified atherosclerotic plaque. Limited assessment due to contrast timing. Mediastinum/Nodes: Thoracic inlet structures are normal. No axillary lymphadenopathy. No mediastinal adenopathy.  No hilar adenopathy. Lungs/Pleura: Interval development of a large partially loculated RIGHT-sided pleural effusion with some fluid tracking into the major fissure. Partial collapse of the RIGHT lower lobe since the previous exam. LEFT lung is clear. Small bulla at the RIGHT lung apex is unchanged along with pleural and parenchymal scarring at the apices. Centrilobular emphysema as well. Upper Abdomen: Incidental imaging of upper abdominal contents is unremarkable with limited assessment. Musculoskeletal: Multiple RIGHT-sided rib fractures with overlapping of posterior ribs 6 and 7 on the RIGHT showing a similar appearance. Some callus formation is evident since the prior study. Visualized clavicles and scapulae are unremarkable. Sternum is intact. No  LEFT-sided rib fractures. Review of the MIP images confirms the above findings. IMPRESSION: 1. No signs of pulmonary embolism. 2. Interval development of a large partially loculated RIGHT-sided pleural effusion with some fluid tracking into the major fissure. 3. Partial collapse of the RIGHT lower lobe since the previous exam. Interval development also some ground-glass in the RIGHT upper lobe may be related to volume loss secondary to adjacent effusion. Correlate with any signs of infection. 4. Multiple RIGHT-sided rib fractures with overlapping of posterior ribs 6 and 7 on the RIGHT showing a similar appearance. Some callus formation is evident since the prior study. 5. Emphysema and aortic atherosclerosis. Aortic Atherosclerosis (ICD10-I70.0) and Emphysema (ICD10-J43.9). Electronically Signed   By: Zetta Bills M.D.   On: 08/14/2019 17:37   DG Chest Portable 1 View  Result Date: 08/14/2019 CLINICAL DATA:  Placement of chest tube EXAM: PORTABLE CHEST 1 VIEW COMPARISON:  CT  same day 4:47 p.m. FINDINGS: The heart size and mediastinal contours are within normal limits. Aortic knob calcifications are seen. There is been interval placement of pigtail catheter within the right lower lung. There is a new small right pneumothorax present. No mediastinal shift. Hazy layering moderate pleural effusion seen at the right lung base. The left lung is clear. IMPRESSION: Interval placement of right-sided pigtail catheter. New small right pneumothorax. Small to moderate loculated right effusion. These results were called by telephone at the time of interpretation on 08/14/2019 at 9:16 pm to provider [facility] , who verbally acknowledged these results. Electronically Signed   By: Prudencio Pair M.D.   On: 08/14/2019 21:22        Scheduled Meds: . carvedilol  6.25 mg Oral BID WC  . hydroxychloroquine  200 mg Oral Daily  . lisinopril  5 mg Oral Daily   Continuous Infusions:        Aline August,  MD [facility] 08/15/2019, 10:49 AM

## 2019-08-15 NOTE — Progress Notes (Signed)
PCCM Interval Note  Pleural fluid analysis from last night reveals an exudate with PMN's and monocytes. Gram stain / cx negative so far.   Chest tube 170cc out since this morning.   Recommend:  - continue to suction - follow cx data - follow CXR - depending on clearance we may consider lytics instillation.   Pulm will follow.    Baltazar Apo, MD, PhD 08/15/2019, 3:53 PM [facility] [phone] or if no answer [phone]

## 2019-08-16 ENCOUNTER — Inpatient Hospital Stay (HOSPITAL_COMMUNITY): Payer: Medicare Other

## 2019-08-16 LAB — CBC WITH DIFFERENTIAL/PLATELET
Abs Immature Granulocytes: 0.01 10*3/uL (ref 0.00–0.07)
Basophils Absolute: 0 10*3/uL (ref 0.0–0.1)
Basophils Relative: 0 %
Eosinophils Absolute: 0.3 10*3/uL (ref 0.0–0.5)
Eosinophils Relative: 5 %
HCT: 30 % — ABNORMAL LOW (ref 39.0–52.0)
Hemoglobin: 10.1 g/dL — ABNORMAL LOW (ref 13.0–17.0)
Immature Granulocytes: 0 %
Lymphocytes Relative: 31 %
Lymphs Abs: 1.4 10*3/uL (ref 0.7–4.0)
MCH: 30.6 pg (ref 26.0–34.0)
MCHC: 33.7 g/dL (ref 30.0–36.0)
MCV: 90.9 fL (ref 80.0–100.0)
Monocytes Absolute: 0.8 10*3/uL (ref 0.1–1.0)
Monocytes Relative: 17 %
Neutro Abs: 2.1 10*3/uL (ref 1.7–7.7)
Neutrophils Relative %: 47 %
Platelets: 296 10*3/uL (ref 150–400)
RBC: 3.3 MIL/uL — ABNORMAL LOW (ref 4.22–5.81)
RDW: 12.7 % (ref 11.5–15.5)
WBC: 4.6 10*3/uL (ref 4.0–10.5)
nRBC: 0 % (ref 0.0–0.2)

## 2019-08-16 LAB — BASIC METABOLIC PANEL
Anion gap: 7 (ref 5–15)
BUN: 11 mg/dL (ref 8–23)
CO2: 23 mmol/L (ref 22–32)
Calcium: 8.1 mg/dL — ABNORMAL LOW (ref 8.9–10.3)
Chloride: 98 mmol/L (ref 98–111)
Creatinine, Ser: 0.86 mg/dL (ref 0.61–1.24)
GFR calc Af Amer: >60 mL/min (ref >60)
GFR calc non Af Amer: >60 mL/min (ref >60)
Glucose, Bld: 89 mg/dL (ref 70–99)
Potassium: 4 mmol/L (ref 3.5–5.1)
Sodium: 128 mmol/L — ABNORMAL LOW (ref 135–145)

## 2019-08-16 LAB — ACID FAST SMEAR (AFB, MYCOBACTERIA): Acid Fast Smear: NEGATIVE

## 2019-08-16 LAB — TSH: TSH: 1.151 u[IU]/mL (ref 0.350–4.500)

## 2019-08-16 MED ORDER — SODIUM CHLORIDE 1 G PO TABS
1.0000 g | ORAL_TABLET | Freq: Three times a day (TID) | ORAL | Status: DC
  Administered 2019-08-16 – 2019-08-17 (×5): 1 g via ORAL
  Filled 2019-08-16 (×7): qty 1

## 2019-08-16 NOTE — Consult Note (Signed)
NAME:  Kevin Leon, MRN:  [id number], DOB:  1950/03/17, LOS: 2 ADMISSION DATE:  08/14/2019, CONSULTATION DATE: 08/14/2019 REFERRING MD:  Rise Patience, MD CHIEF COMPLAINT: Pleural effusion  Brief History   69 year old male with recent chest trauma secondary to injury inflicted by horse kick with resultant rib fractures who presents with shortness of breath and found with large partially loculated pleural effusion. PCCM consulted for chest tube placement and management.  History of present illness   Kevin Leon is a 69 year old male pmhx as noted below with recent chest trauma secondary to injury inflicted by horse kick with resultant rib fractures who presents with shortness of breath and found with large partially loculated pleural effusion. Five weeks ago, he was kicked in the right chest by a horse and fell and hit his head. No LOC reported. He presented to urgent care at the time and diagnosed with right-sided rib fractures and pulmonary contusion with small right pleural effusion. In the last week, he reports worsening shortness of breath with activity and speaking. Endorses low-grade fevers, fatigue and loss of appetite. Due to these symptoms and worsening CXR, he presented to urgent care and was advised to go to [facility] ED for evaluation.   Past Medical History  RA, prior IVDU (36 years ago), EtOH abuse (last drink 8 months ago), hepatitis C s/p treatment, latent TB s/p 2 months treatment, atrial fibrillation s/p DCCV 08/2015, HTN, anxiety  Significant Hospital Events   7/30 - Admit, chest tube placement  Consults:  PCCM  Procedures:  7/30 - Right chest tube  Significant Diagnostic Tests:  7/30 CTA - No PE. Large partially loculated right-sided effusion with RLL atelectasis, 6th and 7th rib fracture, background emphysema  Micro Data:  Pleural culture 7/30  Antimicrobials:    Interim history/subjective:  Breathing is better 1800 cc out from his chest tube  immediately, blood-tinged.  None since is recorded  Objective   Blood pressure (!) 130/69, pulse 79, temperature 99.6 F (37.6 C), temperature source Oral, resp. rate 16, height 6' (1.829 m), weight 64.5 kg, SpO2 97 %.        Intake/Output Summary (Last 24 hours) at 08/16/2019 1432 Last data filed at 08/16/2019 1052 Gross per 24 hour  Intake 2805 ml  Output 2435 ml  Net 370 ml   Filed Weights   08/14/19 1319 08/15/19 0122 08/16/19 0500  Weight: 62.3 kg 64.8 kg 64.5 kg   Physical Exam: General: Thin man comfortable in bed, no distress HENT: Oropharynx clear Eyes: Normal Respiratory: Good air movement bilaterally, no wheezing no crackles Cardiovascular: Regular, distant, no murmur GI: Benign Extremities no edema Neuro awake, alert, appropriate, nonfocal Skin: Chest bruising Psych: Normal mood, normal affect  Resolved Hospital Problem list     Assessment & Plan:   Right pleural effusion/hemothorax secondary to blunt force trauma History suggests blunt force trauma as etiology. Of note, patient does have history of latent TB without treatment, RA  -Pleural fluid indices consistent with an exudate, culture negative -In absence of any further fluid drainage and in absence of air leak I believe we can put the patient on waterseal now.  Check a chest x-ray in the morning and if no pneumothorax or accumulation of fluid then would remove his chest tube.  May be able to go home on 8/2 if chest tube removed and well-tolerated  Emphysema -Albuterol as needed -Plan for pulmonary follow-up and outpatient pulmonary function testing  Best practice:  Diet: Per primary Pain/Anxiety/Delirium protocol (  if indicated): -- VAP protocol (if indicated): -- DVT prophylaxis: Per primary GI prophylaxis: -- Glucose control: Per primary Mobility: As tolerated Code Status: Full code Family Communication: Updated patient at bedside on 8/1 Disposition: TRH  Labs   CBC: Recent Labs  Lab  08/14/19 1200 08/14/19 1328 08/15/19 0235 08/16/19 0309  WBC 4.9 5.2 4.5 4.6  NEUTROABS  --  2.7  --  2.1  HGB 11.9* 11.7* 11.3* 10.1*  HCT 35.5* 35.4* 33.6* 30.0*  MCV 91.0 94.7 92.6 90.9  PLT 341 332 317 888    Basic Metabolic Panel: Recent Labs  Lab 08/14/19 1328 08/15/19 0235 08/16/19 0309  NA 128* 128* 128*  K 4.5 4.3 4.0  CL 95* 97* 98  CO2 22 21* 23  GLUCOSE 100* 72 89  BUN 15 12 11   CREATININE 0.94 0.96 0.86  CALCIUM 8.6* 8.5* 8.1*   GFR: Estimated Creatinine Clearance: 74 mL/min (by C-G formula based on SCr of 0.86 mg/dL). Recent Labs  Lab 08/14/19 1200 08/14/19 1328 08/15/19 0235 08/16/19 0309  WBC 4.9 5.2 4.5 4.6    Liver Function Tests: Recent Labs  Lab 08/14/19 2222  PROT 6.3*   No results for input(s): LIPASE, AMYLASE in the last 168 hours. No results for input(s): AMMONIA in the last 168 hours.  ABG    Component Value Date/Time   TCO2 29 07/27/2007 1031      Care time: NA     Baltazar Apo, MD, PhD 08/16/2019, 2:36 PM [facility] [phone] or if no answer [phone]

## 2019-08-16 NOTE — Progress Notes (Signed)
Patient ID: Kevin Leon, male   DOB: 1950/06/22, 69 y.o.   MRN: [id number]  PROGRESS NOTE    DESMEN SCHOFFSTALL  [name]:[id number] DOB: 1950/05/27 DOA: 08/14/2019 PCP: Vicenta Aly, FNP   Brief Narrative:  69 y.o. male with history of hypertension, tobacco abuse, previous history of paroxysmal A. 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  In the ED, CT scan of chest showed right sided loculated pleural effusion for which pulmonary critical care was consulted patient had a chest tube placed by pulmonary critical care.  Assessment & Plan:   Right-sided loculated pleural effusion -Probably due to recent chest trauma.  Unlikely that this is tubercular pleural effusion -Status post right-sided chest tube placement by pulmonary.  Follow-up pleural fluid analysis and further pulmonary recommendations.  Currently not on any antibiotics. -Respiratory status currently stable on room air  Hyponatremia -Sodium still 128.  DC IV fluids.  Hypertension -Blood pressure stable.  Continue lisinopril and Coreg  History of rheumatoid arthritis -Continue Plaquenil  Anemia of chronic disease -Probably from rheumatoid arthritis.  Hemoglobin stable.  Monitor  Tobacco abuse  -Patient will need to work on tobacco cessation   DVT prophylaxis: SCDs Code Status: Full Family Communication: None at bedside Disposition Plan: Status is: Inpatient  Remains inpatient appropriate because:Inpatient level of care appropriate due to severity of illness   Dispo: The patient is from: Home              Anticipated d/c is to: Home              Anticipated d/c date is: 2 days              Patient currently is not medically stable to d/c.   Consultants: Pulmonary  Procedures: Right-sided chest tube  placement by pulmonary on 08/14/2019  Antimicrobials: None   Subjective: Patient seen and examined at bedside.  Denies overnight fever, worsening shortness of breath, chest pain.  No nausea or vomiting reported.  Feels better and wants to go home today.  Objective: Vitals:   08/15/19 1439 08/15/19 2012 08/16/19 0034 08/16/19 0500  BP: (!) 129/66 (!) 108/58 (!) 126/62 (!) 137/68  Pulse: 85 84 80 85  Resp: 16 15 16 16   Temp: 98.9 F (37.2 C) 99.3 F (37.4 C) 99 F (37.2 C) 97.9 F (36.6 C)  TempSrc: Oral Oral Axillary Axillary  SpO2: 94% 95% 95% 96%  Weight:    64.5 kg  Height:        Intake/Output Summary (Last 24 hours) at 08/16/2019 0757 Last data filed at 08/16/2019 0700 Gross per 24 hour  Intake 2425 ml  Output 2260 ml  Net 165 ml   Filed Weights   08/14/19 1319 08/15/19 0122 08/16/19 0500  Weight: 62.3 kg 64.8 kg 64.5 kg    Examination:  General exam: No acute distress. Respiratory system: Bilateral decreased breath sounds at bases scattered crackles.  Right-sided chest tube still present  cardiovascular system: Rate controlled, S1-S2 heard Gastrointestinal system: Abdomen is nondistended, soft and nontender.  Bowel sounds are heard  extremities: Trace lower extremity edema.  No cyanosis or clubbing    Data Reviewed: I have personally reviewed following labs and imaging studies  CBC: Recent Labs  Lab 08/14/19 1200 08/14/19 1328 08/15/19 0235 08/16/19 0309  WBC 4.9 5.2  4.5 4.6  NEUTROABS  --  2.7  --  2.1  HGB 11.9* 11.7* 11.3* 10.1*  HCT 35.5* 35.4* 33.6* 30.0*  MCV 91.0 94.7 92.6 90.9  PLT 341 332 317 616   Basic Metabolic Panel: Recent Labs  Lab 08/14/19 1328 08/15/19 0235 08/16/19 0309  NA 128* 128* 128*  K 4.5 4.3 4.0  CL 95* 97* 98  CO2 22 21* 23  GLUCOSE 100* 72 89  BUN 15 12 11   CREATININE 0.94 0.96 0.86  CALCIUM 8.6* 8.5* 8.1*   GFR: Estimated Creatinine Clearance: 74 mL/min (by C-G formula based on SCr of 0.86 mg/dL). Liver  Function Tests: Recent Labs  Lab 08/14/19 2222  PROT 6.3*   No results for input(s): LIPASE, AMYLASE in the last 168 hours. No results for input(s): AMMONIA in the last 168 hours. Coagulation Profile: No results for input(s): INR, PROTIME in the last 168 hours. Cardiac Enzymes: No results for input(s): CKTOTAL, CKMB, CKMBINDEX, TROPONINI in the last 168 hours. BNP (last 3 results) No results for input(s): PROBNP in the last 8760 hours. HbA1C: No results for input(s): HGBA1C in the last 72 hours. CBG: No results for input(s): GLUCAP in the last 168 hours. Lipid Profile: No results for input(s): CHOL, HDL, LDLCALC, TRIG, CHOLHDL, LDLDIRECT in the last 72 hours. Thyroid Function Tests: Recent Labs    08/16/19 0309  TSH 1.151   Anemia Panel: No results for input(s): VITAMINB12, FOLATE, FERRITIN, TIBC, IRON, RETICCTPCT in the last 72 hours. Sepsis Labs: No results for input(s): PROCALCITON, LATICACIDVEN in the last 168 hours.  Recent Results (from the past 240 hour(s))  SARS Coronavirus 2 by RT PCR (hospital order, performed in [facility] hospital lab) Nasopharyngeal Nasopharyngeal Swab     Status: None   Collection Time: 08/14/19  1:28 PM   Specimen: Nasopharyngeal Swab  Result Value Ref Range Status   SARS Coronavirus 2 NEGATIVE NEGATIVE Final    Comment: (NOTE) SARS-CoV-2 target nucleic acids are NOT DETECTED.  The SARS-CoV-2 RNA is generally detectable in upper and lower respiratory specimens during the acute phase of infection. The lowest concentration of SARS-CoV-2 viral copies this assay can detect is 250 copies / mL. A negative result does not preclude SARS-CoV-2 infection and should not be used as the sole basis for treatment or other patient management decisions.  A negative result may occur with improper specimen collection / handling, submission of specimen other than nasopharyngeal swab, presence of viral mutation(s) within the areas targeted by this assay,  and inadequate number of viral copies (<250 copies / mL). A negative result must be combined with clinical observations, patient history, and epidemiological information.  Fact Sheet for Patients:   StrictlyIdeas.no  Fact Sheet for Healthcare Providers: BankingDealers.co.za  This test is not yet approved or  cleared by the [place] FDA and has been authorized for detection and/or diagnosis of SARS-CoV-2 by FDA under an Emergency Use Authorization (EUA).  This EUA will remain in effect (meaning this test can be used) for the duration of the COVID-19 declaration under Section 564(b)(1) of the Act, 21 U.S.C. section 360bbb-3(b)(1), unless the authorization is terminated or revoked sooner.  Performed at [facility], [place] [address] [place], [place] [address]   Culture, body fluid-bottle     Status: None (Preliminary result)   Collection Time: 08/14/19  8:35 PM   Specimen: Fluid  Result Value Ref Range Status   Specimen Description FLUID PLEURAL  Final   Special Requests  Final    BOTTLES DRAWN AEROBIC AND ANAEROBIC Blood Culture adequate volume   Culture   Final    NO GROWTH < 12 HOURS Performed at [facility], [place] [address], [place], [place] [address]    Report Status PENDING  Incomplete  Gram stain     Status: None   Collection Time: 08/14/19  8:35 PM   Specimen: Fluid  Result Value Ref Range Status   Specimen Description FLUID PLEURAL  Final   Special Requests NONE  Final   Gram Stain   Final    RARE WBC PRESENT,BOTH PMN AND MONONUCLEAR NO ORGANISMS SEEN Performed at [facility], [address] [place], [place] [address]    Report Status 08/14/2019 FINAL  Final         Radiology Studies: DG Chest 2 View  Result Date: 08/14/2019 CLINICAL DATA:  Dyspnea on exertion. EXAM: CHEST - 2 VIEW COMPARISON:  July 11, 2019. FINDINGS: The heart size and mediastinal contours are within normal limits. No  pneumothorax is noted. Left lung is clear. Moderate right pleural effusion is noted with underlying atelectasis or infiltrate. Right rib fractures are again noted. IMPRESSION: Stable moderate right pleural effusion with underlying atelectasis or infiltrate. Right rib fractures are again noted. Electronically Signed   By: Marijo Conception M.D.   On: 08/14/2019 12:21   CT Angio Chest PE W and/or Wo Contrast  Result Date: 08/14/2019 CLINICAL DATA:  Suspected PE EXAM: CT ANGIOGRAPHY CHEST WITH CONTRAST TECHNIQUE: Multidetector CT imaging of the chest was performed using the standard protocol during bolus administration of intravenous contrast. Multiplanar CT image reconstructions and MIPs were obtained to evaluate the vascular anatomy. CONTRAST:  20mL OMNIPAQUE IOHEXOL 350 MG/ML SOLN COMPARISON:  July 11, 2019 FINDINGS: Cardiovascular:  No signs of pulmonary embolism. Heart size is normal with coronary artery disease. No pericardial effusion. Aortic caliber is normal with evidence of calcified and noncalcified atherosclerotic plaque. Limited assessment due to contrast timing. Mediastinum/Nodes: Thoracic inlet structures are normal. No axillary lymphadenopathy. No mediastinal adenopathy.  No hilar adenopathy. Lungs/Pleura: Interval development of a large partially loculated RIGHT-sided pleural effusion with some fluid tracking into the major fissure. Partial collapse of the RIGHT lower lobe since the previous exam. LEFT lung is clear. Small bulla at the RIGHT lung apex is unchanged along with pleural and parenchymal scarring at the apices. Centrilobular emphysema as well. Upper Abdomen: Incidental imaging of upper abdominal contents is unremarkable with limited assessment. Musculoskeletal: Multiple RIGHT-sided rib fractures with overlapping of posterior ribs 6 and 7 on the RIGHT showing a similar appearance. Some callus formation is evident since the prior study. Visualized clavicles and scapulae are unremarkable.  Sternum is intact. No LEFT-sided rib fractures. Review of the MIP images confirms the above findings. IMPRESSION: 1. No signs of pulmonary embolism. 2. Interval development of a large partially loculated RIGHT-sided pleural effusion with some fluid tracking into the major fissure. 3. Partial collapse of the RIGHT lower lobe since the previous exam. Interval development also some ground-glass in the RIGHT upper lobe may be related to volume loss secondary to adjacent effusion. Correlate with any signs of infection. 4. Multiple RIGHT-sided rib fractures with overlapping of posterior ribs 6 and 7 on the RIGHT showing a similar appearance. Some callus formation is evident since the prior study. 5. Emphysema and aortic atherosclerosis. Aortic Atherosclerosis (ICD10-I70.0) and Emphysema (ICD10-J43.9). Electronically Signed   By: Zetta Bills M.D.   On: 08/14/2019 17:37   DG CHEST  PORT 1 VIEW  Result Date: 08/15/2019 CLINICAL DATA:  RIGHT pleural effusion EXAM: PORTABLE CHEST 1 VIEW COMPARISON:  Chest x-ray August 14, 2019 FINDINGS: Is trachea is midline.  Cardiomediastinal contours are stable. Graded opacity in the RIGHT chest with small peripheral and apical lucency with similar appearance accounting for projection. 9 mm as compared to 7 mm greatest thickness along the RIGHT chest with similar appearance of apical component with RIGHT-sided chest tube in place. Chest tube in similar position to the previous exam. Signs of RIGHT-sided rib fractures as seen on the prior study. LEFT chest is clear. IMPRESSION: Small RIGHT pneumothorax with persistent, stable pleural fluid in the RIGHT chest compared to most recent radiograph. Pneumothorax is within 1-2 mm of previous measurement, attention on follow-up. Subtle difference could be due to projection. Chest tube in similar position. Electronically Signed   By: Zetta Bills M.D.   On: 08/15/2019 11:20   DG Chest Portable 1 View  Result Date: 08/14/2019 CLINICAL DATA:   Placement of chest tube EXAM: PORTABLE CHEST 1 VIEW COMPARISON:  CT same day 4:47 p.m. FINDINGS: The heart size and mediastinal contours are within normal limits. Aortic knob calcifications are seen. There is been interval placement of pigtail catheter within the right lower lung. There is a new small right pneumothorax present. No mediastinal shift. Hazy layering moderate pleural effusion seen at the right lung base. The left lung is clear. IMPRESSION: Interval placement of right-sided pigtail catheter. New small right pneumothorax. Small to moderate loculated right effusion. These results were called by telephone at the time of interpretation on 08/14/2019 at 9:16 pm to provider [facility] , who verbally acknowledged these results. Electronically Signed   By: Prudencio Pair M.D.   On: 08/14/2019 21:22        Scheduled Meds: . carvedilol  6.25 mg Oral BID WC  . hydroxychloroquine  200 mg Oral Daily  . lisinopril  5 mg Oral Daily   Continuous Infusions: . sodium chloride 100 mL/hr at 08/16/19 0018          Aline August, MD [facility] 08/16/2019, 7:57 AM

## 2019-08-17 ENCOUNTER — Inpatient Hospital Stay (HOSPITAL_COMMUNITY): Payer: Medicare Other

## 2019-08-17 ENCOUNTER — Telehealth: Payer: Self-pay | Admitting: Acute Care

## 2019-08-17 DIAGNOSIS — J90 Pleural effusion, not elsewhere classified: Secondary | ICD-10-CM

## 2019-08-17 LAB — BASIC METABOLIC PANEL
Anion gap: 7 (ref 5–15)
BUN: 10 mg/dL (ref 8–23)
CO2: 24 mmol/L (ref 22–32)
Calcium: 8.1 mg/dL — ABNORMAL LOW (ref 8.9–10.3)
Chloride: 98 mmol/L (ref 98–111)
Creatinine, Ser: 0.82 mg/dL (ref 0.61–1.24)
GFR calc Af Amer: >60 mL/min (ref >60)
GFR calc non Af Amer: >60 mL/min (ref >60)
Glucose, Bld: 95 mg/dL (ref 70–99)
Potassium: 4.1 mmol/L (ref 3.5–5.1)
Sodium: 129 mmol/L — ABNORMAL LOW (ref 135–145)

## 2019-08-17 LAB — MAGNESIUM: Magnesium: 1.9 mg/dL (ref 1.7–2.4)

## 2019-08-17 LAB — CYTOLOGY - NON PAP

## 2019-08-17 NOTE — Progress Notes (Signed)
NAME:  Kevin Leon, MRN:  [id number], DOB:  1950/07/22, LOS: 3 ADMISSION DATE:  08/14/2019, CONSULTATION DATE: 08/14/2019 REFERRING MD:  Rise Patience, MD CHIEF COMPLAINT: Pleural effusion  Brief History   69 year old male with recent chest trauma secondary to injury inflicted by horse kick with resultant rib fractures who presents with shortness of breath and found with large partially loculated pleural effusion. PCCM consulted for chest tube placement and management.  History of present illness   Kevin Leon is a 69 year old male pmhx as noted below with recent chest trauma secondary to injury inflicted by horse kick with resultant rib fractures who presents with shortness of breath and found with large partially loculated pleural effusion. Five weeks ago, he was kicked in the right chest by a horse and fell and hit his head. No LOC reported. He presented to urgent care at the time and diagnosed with right-sided rib fractures and pulmonary contusion with small right pleural effusion. In the last week, he reports worsening shortness of breath with activity and speaking. Endorses low-grade fevers, fatigue and loss of appetite. Due to these symptoms and worsening CXR, he presented to urgent care and was advised to go to [facility] ED for evaluation.   Past Medical History  RA, prior IVDU (36 years ago), EtOH abuse (last drink 8 months ago), hepatitis C s/p treatment, latent TB s/p 2 months treatment, atrial fibrillation s/p DCCV 08/2015, HTN, anxiety  Significant Hospital Events   7/30 - Admit, chest tube placement 8/1- chest tube to water seal   Consults:  PCCM  Procedures:  7/30 - Right chest tube  Significant Diagnostic Tests:  7/30 CTA - No PE. Large partially loculated right-sided effusion with RLL atelectasis, 6th and 7th rib fracture, background emphysema  Micro Data:  Pleural culture 7/30  Antimicrobials:    Interim history/subjective:  NAEO Has tolerated water  seal well  Stating he wants to go home   Objective   Blood pressure 123/65, pulse 81, temperature 99.3 F (37.4 C), temperature source Oral, resp. rate 18, height 6' (1.829 m), weight 65.4 kg, SpO2 99 %.        Intake/Output Summary (Last 24 hours) at 08/17/2019 0955 Last data filed at 08/17/2019 0724 Gross per 24 hour  Intake 760 ml  Output 700 ml  Net 60 ml   Filed Weights   08/15/19 0122 08/16/19 0500 08/17/19 0407  Weight: 64.8 kg 64.5 kg 65.4 kg   Physical Exam: General: Thin, adult M. Reclined in bed NAD HEENT: NCAT pink mmm trachea midline  Respiratory: CTA bilaterally. Diminished RLL  Cardiovascular: RRR s1s2 no rgm  GI: soft flat ndnt  Extremities: Symmetrical bulk and tone no edema  Neuro AAO x 4 PERRLA  Skin: Ecchymosis on chest  Psych: Anxious mood, congruent affect   Resolved Hospital Problem list     Assessment & Plan:   Right pleural effusion/hemothorax secondary to blunt force trauma History suggests blunt force trauma as etiology. Of note, patient does have history of latent TB without treatment, RA  -Pleural fluid indices consistent with an exudate, culture negative -CXR 8/2 stable, taken after interval change to water seal  P -Discussed with PCCM MD -- will remove chest tube 8/2 -Follow up CXR 8/2 2-3 hrs after chest tube removal -if tolerates removal well, possible dc 8/2  Emphysema -Albuterol as needed -Plan for pulmonary follow-up and outpatient pulmonary function testing  Best practice:  Diet: Per primary Pain/Anxiety/Delirium protocol (if indicated): -- VAP protocol (if  indicated): -- DVT prophylaxis: Per primary GI prophylaxis: -- Glucose control: Per primary Mobility: As tolerated Code Status: Full code Family Communication: Patient updated 8/2  Disposition: TRH  Labs   CBC: Recent Labs  Lab 08/14/19 1200 08/14/19 1328 08/15/19 0235 08/16/19 0309  WBC 4.9 5.2 4.5 4.6  NEUTROABS  --  2.7  --  2.1  HGB 11.9* 11.7* 11.3* 10.1*    HCT 35.5* 35.4* 33.6* 30.0*  MCV 91.0 94.7 92.6 90.9  PLT 341 332 317 564    Basic Metabolic Panel: Recent Labs  Lab 08/14/19 1328 08/15/19 0235 08/16/19 0309 08/17/19 0320  NA 128* 128* 128* 129*  K 4.5 4.3 4.0 4.1  CL 95* 97* 98 98  CO2 22 21* 23 24  GLUCOSE 100* 72 89 95  BUN 15 12 11 10   CREATININE 0.94 0.96 0.86 0.82  CALCIUM 8.6* 8.5* 8.1* 8.1*  MG  --   --   --  1.9   GFR: Estimated Creatinine Clearance: 78.6 mL/min (by C-G formula based on SCr of 0.82 mg/dL). Recent Labs  Lab 08/14/19 1200 08/14/19 1328 08/15/19 0235 08/16/19 0309  WBC 4.9 5.2 4.5 4.6    Liver Function Tests: Recent Labs  Lab 08/14/19 2222  PROT 6.3*   No results for input(s): LIPASE, AMYLASE in the last 168 hours. No results for input(s): AMMONIA in the last 168 hours.  ABG    Component Value Date/Time   TCO2 29 07/27/2007 1031      Eliseo Gum MSN, AGACNP-BC [place] [phone] If no answer, [phone] 08/17/2019, 9:55 AM

## 2019-08-17 NOTE — Telephone Encounter (Signed)
Pt. Remains in the hospital on 08/17/2019.  Will continue to check status.

## 2019-08-17 NOTE — Discharge Summary (Signed)
Physician Discharge Summary  LUISALBERTO BEEGLE VHQ:[id number] DOB: [birth year] DOA: 08/14/2019  PCP: Vicenta Aly, FNP  Admit date: 08/14/2019 Discharge date: 08/17/2019  Admitted From: Home Disposition: Home  Recommendations for Outpatient Follow-up:  1. Follow up with PCP in 1 week with repeat CBC/BMP 2. Follow up in ED if symptoms worsen or new appear   Home Health: No Equipment/Devices: None  Discharge Condition: Stable CODE STATUS: Full Diet recommendation: Regular diet for now till he sees his PCP with repeat sodium levels  Brief/Interim Summary: 69 y.o.malewithhistory of hypertension, tobacco abuse, previous history of paroxysmal A. 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  In the ED, CT scan of chest showed right sided loculated pleural effusion for which pulmonary critical care was consulted patient had a chest tube placed by pulmonary critical care. During the hospitalization, his respiratory status improved.  Chest tube has been removed by pulmonary today.  He will be discharged home once cleared by pulmonary.  Discharge Diagnoses:   Right-sided loculated pleural effusion -Probably due to recent chest trauma.  Unlikely that this is tubercular pleural effusion -Status post right-sided chest tube placement by pulmonary.   Currently not on any antibiotics. -Respiratory status currently stable on room air -Chest tube has been removed by pulmonary today.  Repeat chest x-ray afterwards was reviewed with Dr. Ander Slade and he is okay for the patient to be discharged home today.    Hyponatremia -Treated with IV fluids which has been discontinued.  Started on salt tablets on 08/16/2019.  Sodium 129 today.  Offered to discharge him on salt tablets but he states that  he has chronic low sodium and would like to take extra salt with his food and follow-up with PCP.  Hypertension -Blood pressure stable.  Continue lisinopril and Coreg  History of rheumatoid arthritis -Continue Plaquenil  Anemia of chronic disease -Probably from rheumatoid arthritis.  Hemoglobin stable.  Monitor  Tobacco abuse  -Patient will need to work on tobacco cessation  Discharge Instructions   Allergies as of 08/17/2019      Reactions   Isoniazid Other (See Comments)   Tremors and very elevated temp   Priftin [rifapentine] Other (See Comments)   Tremors and very elevated temp      Medication List    STOP taking these medications   predniSONE 5 MG tablet Commonly known as: DELTASONE     TAKE these medications   acetaminophen 325 MG tablet Commonly known as: TYLENOL Take 325 mg by mouth every 6 (six) hours as needed for mild pain or headache.   carvedilol 6.25 MG tablet Commonly known as: COREG TAKE 1 TABLET (6.25 MG TOTAL) BY MOUTH 2 (TWO) TIMES DAILY WITH A MEAL. What changed: See the new instructions.   hydroxychloroquine 200 MG tablet Commonly known as: PLAQUENIL Take 200 mg by mouth daily.   lisinopril 5 MG tablet Commonly known as: ZESTRIL Take 5 mg by mouth daily. What changed: Another medication with the same name was removed. Continue taking this medication, and follow the directions you see here.   LORazepam 0.5 MG tablet Commonly known as: ATIVAN Take 0.5 mg by mouth every 6 (six) hours.   Systane Ultra PF 0.4-0.3 % Soln Generic drug: Polyethyl Glyc-Propyl Glyc PF Place 1 drop into both eyes daily as needed (for dryness/irritation).  Follow-up Information    Vicenta Aly, [place]. Schedule an appointment as soon as possible for a visit in 1 week(s).   Specialty: Nurse Practitioner Why: With repeat BMP Contact information: [address] [place] [address] [phone]        Nahser, Wonda Cheng, MD .    Specialty: Cardiology Contact information: [place] [address] [place] [address] [phone]              Allergies  Allergen Reactions  . Isoniazid Other (See Comments)    Tremors and very elevated temp  . Priftin [Rifapentine] Other (See Comments)    Tremors and very elevated temp    Consultations:  PCCM   Procedures/Studies: DG Chest 2 View  Result Date: 08/14/2019 CLINICAL DATA:  Dyspnea on exertion. EXAM: CHEST - 2 VIEW COMPARISON:  July 11, 2019. FINDINGS: The heart size and mediastinal contours are within normal limits. No pneumothorax is noted. Left lung is clear. Moderate right pleural effusion is noted with underlying atelectasis or infiltrate. Right rib fractures are again noted. IMPRESSION: Stable moderate right pleural effusion with underlying atelectasis or infiltrate. Right rib fractures are again noted. Electronically Signed   By: Marijo Conception M.D.   On: 08/14/2019 12:21   CT Angio Chest PE W and/or Wo Contrast  Result Date: 08/14/2019 CLINICAL DATA:  Suspected PE EXAM: CT ANGIOGRAPHY CHEST WITH CONTRAST TECHNIQUE: Multidetector CT imaging of the chest was performed using the standard protocol during bolus administration of intravenous contrast. Multiplanar CT image reconstructions and MIPs were obtained to evaluate the vascular anatomy. CONTRAST:  25mL OMNIPAQUE IOHEXOL 350 MG/ML SOLN COMPARISON:  July 11, 2019 FINDINGS: Cardiovascular:  No signs of pulmonary embolism. Heart size is normal with coronary artery disease. No pericardial effusion. Aortic caliber is normal with evidence of calcified and noncalcified atherosclerotic plaque. Limited assessment due to contrast timing. Mediastinum/Nodes: Thoracic inlet structures are normal. No axillary lymphadenopathy. No mediastinal adenopathy.  No hilar adenopathy. Lungs/Pleura: Interval development of a large partially loculated RIGHT-sided pleural effusion with some fluid tracking into the major  fissure. Partial collapse of the RIGHT lower lobe since the previous exam. LEFT lung is clear. Small bulla at the RIGHT lung apex is unchanged along with pleural and parenchymal scarring at the apices. Centrilobular emphysema as well. Upper Abdomen: Incidental imaging of upper abdominal contents is unremarkable with limited assessment. Musculoskeletal: Multiple RIGHT-sided rib fractures with overlapping of posterior ribs 6 and 7 on the RIGHT showing a similar appearance. Some callus formation is evident since the prior study. Visualized clavicles and scapulae are unremarkable. Sternum is intact. No LEFT-sided rib fractures. Review of the MIP images confirms the above findings. IMPRESSION: 1. No signs of pulmonary embolism. 2. Interval development of a large partially loculated RIGHT-sided pleural effusion with some fluid tracking into the major fissure. 3. Partial collapse of the RIGHT lower lobe since the previous exam. Interval development also some ground-glass in the RIGHT upper lobe may be related to volume loss secondary to adjacent effusion. Correlate with any signs of infection. 4. Multiple RIGHT-sided rib fractures with overlapping of posterior ribs 6 and 7 on the RIGHT showing a similar appearance. Some callus formation is evident since the prior study. 5. Emphysema and aortic atherosclerosis. Aortic Atherosclerosis (ICD10-I70.0) and Emphysema (ICD10-J43.9). Electronically Signed   By: Zetta Bills M.D.   On: 08/14/2019 17:37   DG Chest Port 1 View  Result Date: 08/17/2019 CLINICAL DATA:  RIGHT-sided chest tube. EXAM: PORTABLE CHEST  1 VIEW COMPARISON:  August 16, 2019 FINDINGS: The cardiomediastinal silhouette is unchanged in contour.There is a small RIGHT-sided hydropneumothorax with RIGHT chest tube in place. This is unchanged in comparison to prior. Unchanged mild interstitial prominence. Atherosclerotic calcifications of the aorta. No acute pleuroparenchymal abnormality. Visualized abdomen is  unremarkable. Multilevel degenerative changes of the thoracic spine. IMPRESSION: 1.  Unchanged small RIGHT hydropneumothorax. Electronically Signed   By: Valentino Saxon MD   On: 08/17/2019 08:13   DG Chest Port 1 View  Result Date: 08/16/2019 CLINICAL DATA:  Right pleural effusion. EXAM: PORTABLE CHEST 1 VIEW COMPARISON:  08/15/2019 FINDINGS: Lungs are hyperexpanded. Right pleural drain remains in place with hydropneumothorax again noted. Interval decrease in pleural gas and fluid component also appears decreased in the interval. Left lung clear. The cardiopericardial silhouette is within normal limits for size. Telemetry leads overlie the chest. IMPRESSION: 1. Interval decrease in right hydropneumothorax. 2. Similar appearance of right pleural drain. Electronically Signed   By: Misty Stanley M.D.   On: 08/16/2019 09:34   DG CHEST PORT 1 VIEW  Result Date: 08/15/2019 CLINICAL DATA:  RIGHT pleural effusion EXAM: PORTABLE CHEST 1 VIEW COMPARISON:  Chest x-ray August 14, 2019 FINDINGS: Is trachea is midline.  Cardiomediastinal contours are stable. Graded opacity in the RIGHT chest with small peripheral and apical lucency with similar appearance accounting for projection. 9 mm as compared to 7 mm greatest thickness along the RIGHT chest with similar appearance of apical component with RIGHT-sided chest tube in place. Chest tube in similar position to the previous exam. Signs of RIGHT-sided rib fractures as seen on the prior study. LEFT chest is clear. IMPRESSION: Small RIGHT pneumothorax with persistent, stable pleural fluid in the RIGHT chest compared to most recent radiograph. Pneumothorax is within 1-2 mm of previous measurement, attention on follow-up. Subtle difference could be due to projection. Chest tube in similar position. Electronically Signed   By: Zetta Bills M.D.   On: 08/15/2019 11:20   DG Chest Portable 1 View  Result Date: 08/14/2019 CLINICAL DATA:  Placement of chest tube EXAM: PORTABLE  CHEST 1 VIEW COMPARISON:  CT same day 4:47 p.m. FINDINGS: The heart size and mediastinal contours are within normal limits. Aortic knob calcifications are seen. There is been interval placement of pigtail catheter within the right lower lung. There is a new small right pneumothorax present. No mediastinal shift. Hazy layering moderate pleural effusion seen at the right lung base. The left lung is clear. IMPRESSION: Interval placement of right-sided pigtail catheter. New small right pneumothorax. Small to moderate loculated right effusion. These results were called by telephone at the time of interpretation on 08/14/2019 at 9:16 pm to provider [facility] , who verbally acknowledged these results. Electronically Signed   By: Prudencio Pair M.D.   On: 08/14/2019 21:22       Subjective: Patient seen and examined at bedside.  He feels much better and wants to go home today.  He is breathing much better.  No overnight fever, chest pain or vomiting.  Discharge Exam: Vitals:   08/17/19 0407 08/17/19 0737  BP: (!) 136/64 123/65  Pulse: 84 81  Resp: 20 18  Temp: 99.9 F (37.7 C) 99.3 F (37.4 C)  SpO2: 98% 99%    General: Pt is alert, awake, not in acute distress Cardiovascular: rate controlled, S1/S2 + Respiratory: bilateral decreased breath sounds at bases.   Abdominal: Soft, NT, ND, bowel sounds + Extremities: no edema, no cyanosis    The results  of significant diagnostics from this hospitalization (including imaging, microbiology, ancillary and laboratory) are listed below for reference.     Microbiology: Recent Results (from the past 240 hour(s))  SARS Coronavirus 2 by RT PCR (hospital order, performed in [facility] hospital lab) Nasopharyngeal Nasopharyngeal Swab     Status: None   Collection Time: 08/14/19  1:28 PM   Specimen: Nasopharyngeal Swab  Result Value Ref Range Status   SARS Coronavirus 2 NEGATIVE NEGATIVE Final    Comment: (NOTE) SARS-CoV-2 target nucleic acids are  NOT DETECTED.  The SARS-CoV-2 RNA is generally detectable in upper and lower respiratory specimens during the acute phase of infection. The lowest concentration of SARS-CoV-2 viral copies this assay can detect is 250 copies / mL. A negative result does not preclude SARS-CoV-2 infection and should not be used as the sole basis for treatment or other patient management decisions.  A negative result may occur with improper specimen collection / handling, submission of specimen other than nasopharyngeal swab, presence of viral mutation(s) within the areas targeted by this assay, and inadequate number of viral copies (<250 copies / mL). A negative result must be combined with clinical observations, patient history, and epidemiological information.  Fact Sheet for Patients:   StrictlyIdeas.no  Fact Sheet for Healthcare Providers: BankingDealers.co.za  This test is not yet approved or  cleared by the [place] FDA and has been authorized for detection and/or diagnosis of SARS-CoV-2 by FDA under an Emergency Use Authorization (EUA).  This EUA will remain in effect (meaning this test can be used) for the duration of the COVID-19 declaration under Section 564(b)(1) of the Act, 21 U.S.C. section 360bbb-3(b)(1), unless the authorization is terminated or revoked sooner.  Performed at [facility], [place] [address], [place], [place] [address]   Acid Fast Smear (AFB)     Status: None   Collection Time: 08/14/19  8:35 PM   Specimen: Pleural; Respiratory  Result Value Ref Range Status   AFB Specimen Processing Concentration  Final   Acid Fast Smear Negative  Final    Comment: (NOTE) Performed At: [facility] [place], [place] [id number] Rush Farmer MD PR:[id number]    Source (AFB) FLUID  Final    Comment: PLEURAL Performed at [facility], [place] [address] [place], [place] [address]   Culture, body  fluid-bottle     Status: None (Preliminary result)   Collection Time: 08/14/19  8:35 PM   Specimen: Fluid  Result Value Ref Range Status   Specimen Description FLUID PLEURAL  Final   Special Requests   Final    BOTTLES DRAWN AEROBIC AND ANAEROBIC Blood Culture adequate volume   Culture   Final    NO GROWTH 2 DAYS Performed at [facility], [place] [address], [place], [place] [address]    Report Status PENDING  Incomplete  Gram stain     Status: None   Collection Time: 08/14/19  8:35 PM   Specimen: Fluid  Result Value Ref Range Status   Specimen Description FLUID PLEURAL  Final   Special Requests NONE  Final   Gram Stain   Final    RARE WBC PRESENT,BOTH PMN AND MONONUCLEAR NO ORGANISMS SEEN Performed at [facility], [address], [place], [place] [address]    Report Status 08/14/2019 FINAL  Final     Labs: BNP (last 3 results) No results for input(s): BNP in the last 8760 hours. Basic Metabolic Panel: Recent Labs  Lab 08/14/19 1328 08/15/19 0235 08/16/19 0309 08/17/19 0320  NA 128* 128* 128* 129*  K 4.5 4.3 4.0 4.1  CL 95* 97* 98 98  CO2 22 21* 23 24  GLUCOSE 100* 72 89 95  BUN 15 12 11 10   CREATININE 0.94 0.96 0.86 0.82  CALCIUM 8.6* 8.5* 8.1* 8.1*  MG  --   --   --  1.9   Liver Function Tests: Recent Labs  Lab 08/14/19 2222  PROT 6.3*   No results for input(s): LIPASE, AMYLASE in the last 168 hours. No results for input(s): AMMONIA in the last 168 hours. CBC: Recent Labs  Lab 08/14/19 1200 08/14/19 1328 08/15/19 0235 08/16/19 0309  WBC 4.9 5.2 4.5 4.6  NEUTROABS  --  2.7  --  2.1  HGB 11.9* 11.7* 11.3* 10.1*  HCT 35.5* 35.4* 33.6* 30.0*  MCV 91.0 94.7 92.6 90.9  PLT 341 332 317 296   Cardiac Enzymes: No results for input(s): CKTOTAL, CKMB, CKMBINDEX, TROPONINI in the last 168 hours. BNP: Invalid input(s): POCBNP CBG: No results for input(s): GLUCAP in the last 168 hours. D-Dimer No results for input(s): DDIMER in the last 72  hours. Hgb A1c No results for input(s): HGBA1C in the last 72 hours. Lipid Profile No results for input(s): CHOL, HDL, LDLCALC, TRIG, CHOLHDL, LDLDIRECT in the last 72 hours. Thyroid function studies Recent Labs    08/16/19 0309  TSH 1.151   Anemia work up No results for input(s): VITAMINB12, FOLATE, FERRITIN, TIBC, IRON, RETICCTPCT in the last 72 hours. Urinalysis No results found for: COLORURINE, APPEARANCEUR, [place], [place], [place], [place], [place], [place], PROTEINUR, UROBILINOGEN, NITRITE, LEUKOCYTESUR Sepsis Labs Invalid input(s): PROCALCITONIN,  WBC,  LACTICIDVEN Microbiology Recent Results (from the past 240 hour(s))  SARS Coronavirus 2 by RT PCR (hospital order, performed in [facility] hospital lab) Nasopharyngeal Nasopharyngeal Swab     Status: None   Collection Time: 08/14/19  1:28 PM   Specimen: Nasopharyngeal Swab  Result Value Ref Range Status   SARS Coronavirus 2 NEGATIVE NEGATIVE Final    Comment: (NOTE) SARS-CoV-2 target nucleic acids are NOT DETECTED.  The SARS-CoV-2 RNA is generally detectable in upper and lower respiratory specimens during the acute phase of infection. The lowest concentration of SARS-CoV-2 viral copies this assay can detect is 250 copies / mL. A negative result does not preclude SARS-CoV-2 infection and should not be used as the sole basis for treatment or other patient management decisions.  A negative result may occur with improper specimen collection / handling, submission of specimen other than nasopharyngeal swab, presence of viral mutation(s) within the areas targeted by this assay, and inadequate number of viral copies (<250 copies / mL). A negative result must be combined with clinical observations, patient history, and epidemiological information.  Fact Sheet for Patients:   StrictlyIdeas.no  Fact Sheet for Healthcare Providers: BankingDealers.co.za  This test is not yet  approved or  cleared by the [place] FDA and has been authorized for detection and/or diagnosis of SARS-CoV-2 by FDA under an Emergency Use Authorization (EUA).  This EUA will remain in effect (meaning this test can be used) for the duration of the COVID-19 declaration under Section 564(b)(1) of the Act, 21 U.S.C. section 360bbb-3(b)(1), unless the authorization is terminated or revoked sooner.  Performed at [facility], [place] [address], [place], [place] [address]   Acid Fast Smear (AFB)     Status: None   Collection Time: 08/14/19  8:35 PM   Specimen: Pleural;  Respiratory  Result Value Ref Range Status   AFB Specimen Processing Concentration  Final   Acid Fast Smear Negative  Final    Comment: (NOTE) Performed At: [facility] [place], [place] [id number] Rush Farmer MD ZM:[id number]    Source (AFB) FLUID  Final    Comment: PLEURAL Performed at [facility], [place] [address], [place], [place] [address]   Culture, body fluid-bottle     Status: None (Preliminary result)   Collection Time: 08/14/19  8:35 PM   Specimen: Fluid  Result Value Ref Range Status   Specimen Description FLUID PLEURAL  Final   Special Requests   Final    BOTTLES DRAWN AEROBIC AND ANAEROBIC Blood Culture adequate volume   Culture   Final    NO GROWTH 2 DAYS Performed at [facility], [place] [address], [place], [place] [address]    Report Status PENDING  Incomplete  Gram stain     Status: None   Collection Time: 08/14/19  8:35 PM   Specimen: Fluid  Result Value Ref Range Status   Specimen Description FLUID PLEURAL  Final   Special Requests NONE  Final   Gram Stain   Final    RARE WBC PRESENT,BOTH PMN AND MONONUCLEAR NO ORGANISMS SEEN Performed at [facility], [address], [place], [place] [address]    Report Status 08/14/2019 FINAL  Final     Time coordinating discharge: 35 minutes  SIGNED:   Aline August, MD  [facility] 08/17/2019, 10:41 AM

## 2019-08-18 NOTE — Telephone Encounter (Signed)
Patient wife contacted, follow up appointment made, chest x ray ordered.

## 2019-08-18 NOTE — Telephone Encounter (Signed)
Patient is out of the hospital, unable to set up appointment until after 3:00 today, will call back.

## 2019-08-19 LAB — CULTURE, BODY FLUID W GRAM STAIN -BOTTLE
Culture: NO GROWTH
Special Requests: ADEQUATE

## 2019-08-23 LAB — GRAM STAIN: Special Requests: NORMAL

## 2019-09-01 NOTE — Progress Notes (Signed)
@Patient  ID: Kevin Leon, male    DOB: Nov 18, 1950, 69 y.o.   MRN: [id number]  Chief Complaint  Patient presents with  . Hospitalization Follow-up    sob improving.  wt loss.    Referring provider: Vicenta Aly, FNP  HPI: 69 year old male, current every day smoker. 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 Patient recently admitted 08/14/19 for right sided loculated pleural effusion felt to be d/t recent chest trauma- patient was kicked by horse resulting in rib fracture 5 weeks ago.  Pulmonary/Dr. Lamonte Sakai consulted for chest tube placement and management. Pleural fluid consistent with exudate, culture negative.   09/02/2019 Patient presents today for hospital follow-up for right pleural effusion/hemothorax secondary to blunt force trauma. He is doing ok, breathing has improved but he's not back to his baseline. He has some decreased range of motion right shoulder from fall. He also has decreased appetite which he feels is likely related to recent diagnosis of RA. This started before accident. He is using incentive spirometer at home. He declined lung cancer screening referral today, will follow-up with PCP. He is still smoking 10 cigarettes a day, he has quit in the past several times with use of nicotine patch.   Imaging: 7/30 CTA - No PE. Large partially loculated right-sided effusion with RLL atelectasis, 6th and 7th rib fracture, background emphysema  08/17/19 CXR- Right-sided chest tube has been removed. Minimal to mild right hydropneumothorax is noted which is not significantly changed.  Allergies  Allergen Reactions  . Isoniazid Other (See Comments)    Tremors and very elevated temp  . Priftin [Rifapentine] Other (See Comments)    Tremors and very elevated temp    Immunization History  Administered Date(s) Administered  . Fluad Quad(high Dose 65+) 10/03/2018  .  Moderna SARS-COVID-2 Vaccination 03/11/2019, 04/08/2019  . Tdap 10/01/2014    Past Medical History:  Diagnosis Date  . Anxiety   . Aortic insufficiency    Echo 02/2019: EF 60-65, Gr 2 DD, normal wall motion, normal RV SF, mild LAE, trivial MR/TR, mild to moderate AI  . Arthritis   . Essential hypertension 10/04/2015  . Hepatitis C   . History of stress test    ETT-Echo in 2010 was normal  . History of subdural hematoma 08/15/2016  . HTN (hypertension)   . Persistent atrial fibrillation ([place]) 09/09/2015   CHADS2-VASc=2 //  Apixaban //  s/p DCCV 09/14/15 // Monitor 01/2019: NSR, single NSVT (4 beats); single episode of SVT (6 beats)  . Right rotator cuff tear 10/08/2014  . Tobacco abuse 10/04/2015  . Tuberculosis     Tobacco History: Social History   Tobacco Use  Smoking Status Current Every Day Smoker  . Packs/day: 0.50  . Types: Cigarettes  . Last attempt to quit: 02/15/2014  . Years since quitting: 5.5  Smokeless Tobacco Never Used  Tobacco Comment   smokes 10 per day, trying to quit.   Ready to quit: Yes Counseling given: Not Answered Comment: smokes 10 per day, trying to quit.   Outpatient Medications Prior to Visit  Medication Sig Dispense Refill  . carvedilol (COREG) 6.25 MG tablet TAKE 1 TABLET (6.25 MG TOTAL) BY MOUTH 2 (TWO) TIMES DAILY WITH A MEAL. (Patient taking differently: Take 6.25 mg by mouth 2 (two) times daily with a meal. ) 180 tablet 3  . hydroxychloroquine (PLAQUENIL) 200 MG tablet Take 200 mg by mouth daily.    Marland Kitchen  lisinopril (ZESTRIL) 5 MG tablet Take 5 mg by mouth daily.    Marland Kitchen LORazepam (ATIVAN) 0.5 MG tablet Take 0.5 mg by mouth every 6 (six) hours.     Vladimir Faster Glyc-Propyl Glyc PF (SYSTANE ULTRA PF) 0.4-0.3 % SOLN Place 1 drop into both eyes daily as needed (for dryness/irritation).    Marland Kitchen acetaminophen (TYLENOL) 325 MG tablet Take 325 mg by mouth every 6 (six) hours as needed for mild pain or headache. (Patient not taking: Reported on 09/02/2019)     No  facility-administered medications prior to visit.      Review of Systems  Review of Systems  Constitutional: Negative.   Respiratory: Negative for cough and wheezing.   Musculoskeletal:       Right shoulder pain     Physical Exam  BP 130/60 (BP Location: Left Arm, Cuff Size: Normal)   Pulse 82   Temp 98.1 F (36.7 C) (Temporal)   Ht 6' (1.829 m)   Wt 132 lb 9.6 oz (60.1 kg)   SpO2 100%   BMI 17.98 kg/m  Physical Exam Constitutional:      Appearance: Normal appearance. He is not ill-appearing.     Comments: Thin appearing male  Cardiovascular:     Rate and Rhythm: Normal rate and regular rhythm.  Pulmonary:     Effort: Pulmonary effort is normal.     Breath sounds: Normal breath sounds.     Comments: Mostly clear t/o, slightly diminished right base Neurological:     Mental Status: He is alert.      Lab Results:  CBC    Component Value Date/Time   WBC 4.6 08/16/2019 0309   RBC 3.30 (L) 08/16/2019 0309   HGB 10.1 (L) 08/16/2019 0309   HCT 30.0 (L) 08/16/2019 0309   PLT 296 08/16/2019 0309   MCV 90.9 08/16/2019 0309   MCH 30.6 08/16/2019 0309   MCHC 33.7 08/16/2019 0309   RDW 12.7 08/16/2019 0309   LYMPHSABS 1.4 08/16/2019 0309   MONOABS 0.8 08/16/2019 0309   EOSABS 0.3 08/16/2019 0309   BASOSABS 0.0 08/16/2019 0309    BMET    Component Value Date/Time   NA 129 (L) 08/17/2019 0320   NA 137 06/05/2019 0942   K 4.1 08/17/2019 0320   CL 98 08/17/2019 0320   CO2 24 08/17/2019 0320   GLUCOSE 95 08/17/2019 0320   BUN 10 08/17/2019 0320   BUN 16 06/05/2019 0942   CREATININE 0.82 08/17/2019 0320   CREATININE 0.86 09/09/2015 1002   CALCIUM 8.1 (L) 08/17/2019 0320   GFRNONAA >60 08/17/2019 0320   GFRAA >60 08/17/2019 0320    BNP No results found for: BNP  ProBNP No results found for: PROBNP  Imaging: DG Chest 2 View  Result Date: 08/14/2019 CLINICAL DATA:  Dyspnea on exertion. EXAM: CHEST - 2 VIEW COMPARISON:  July 11, 2019. FINDINGS: The  heart size and mediastinal contours are within normal limits. No pneumothorax is noted. Left lung is clear. Moderate right pleural effusion is noted with underlying atelectasis or infiltrate. Right rib fractures are again noted. IMPRESSION: Stable moderate right pleural effusion with underlying atelectasis or infiltrate. Right rib fractures are again noted. Electronically Signed   By: Marijo Conception M.D.   On: 08/14/2019 12:21   CT Angio Chest PE W and/or Wo Contrast  Result Date: 08/14/2019 CLINICAL DATA:  Suspected PE EXAM: CT ANGIOGRAPHY CHEST WITH CONTRAST TECHNIQUE: Multidetector CT imaging of the chest was performed using the standard protocol during  bolus administration of intravenous contrast. Multiplanar CT image reconstructions and MIPs were obtained to evaluate the vascular anatomy. CONTRAST:  76mL OMNIPAQUE IOHEXOL 350 MG/ML SOLN COMPARISON:  July 11, 2019 FINDINGS: Cardiovascular:  No signs of pulmonary embolism. Heart size is normal with coronary artery disease. No pericardial effusion. Aortic caliber is normal with evidence of calcified and noncalcified atherosclerotic plaque. Limited assessment due to contrast timing. Mediastinum/Nodes: Thoracic inlet structures are normal. No axillary lymphadenopathy. No mediastinal adenopathy.  No hilar adenopathy. Lungs/Pleura: Interval development of a large partially loculated RIGHT-sided pleural effusion with some fluid tracking into the major fissure. Partial collapse of the RIGHT lower lobe since the previous exam. LEFT lung is clear. Small bulla at the RIGHT lung apex is unchanged along with pleural and parenchymal scarring at the apices. Centrilobular emphysema as well. Upper Abdomen: Incidental imaging of upper abdominal contents is unremarkable with limited assessment. Musculoskeletal: Multiple RIGHT-sided rib fractures with overlapping of posterior ribs 6 and 7 on the RIGHT showing a similar appearance. Some callus formation is evident since the  prior study. Visualized clavicles and scapulae are unremarkable. Sternum is intact. No LEFT-sided rib fractures. Review of the MIP images confirms the above findings. IMPRESSION: 1. No signs of pulmonary embolism. 2. Interval development of a large partially loculated RIGHT-sided pleural effusion with some fluid tracking into the major fissure. 3. Partial collapse of the RIGHT lower lobe since the previous exam. Interval development also some ground-glass in the RIGHT upper lobe may be related to volume loss secondary to adjacent effusion. Correlate with any signs of infection. 4. Multiple RIGHT-sided rib fractures with overlapping of posterior ribs 6 and 7 on the RIGHT showing a similar appearance. Some callus formation is evident since the prior study. 5. Emphysema and aortic atherosclerosis. Aortic Atherosclerosis (ICD10-I70.0) and Emphysema (ICD10-J43.9). Electronically Signed   By: Zetta Bills M.D.   On: 08/14/2019 17:37   DG CHEST PORT 1 VIEW  Result Date: 08/17/2019 CLINICAL DATA:  Chest tube removal. EXAM: PORTABLE CHEST 1 VIEW COMPARISON:  Same day. FINDINGS: The heart size and mediastinal contours are within normal limits. Right-sided chest tube has been removed. Minimal to mild right hydropneumothorax is noted which is not significantly changed. Left lung is clear. Mildly displaced right rib fractures are again noted. IMPRESSION: Right-sided chest tube has been removed. Minimal to mild right hydropneumothorax is noted which is not significantly changed. Electronically Signed   By: Marijo Conception M.D.   On: 08/17/2019 15:45   DG Chest Port 1 View  Result Date: 08/17/2019 CLINICAL DATA:  RIGHT-sided chest tube. EXAM: PORTABLE CHEST 1 VIEW COMPARISON:  August 16, 2019 FINDINGS: The cardiomediastinal silhouette is unchanged in contour.There is a small RIGHT-sided hydropneumothorax with RIGHT chest tube in place. This is unchanged in comparison to prior. Unchanged mild interstitial prominence.  Atherosclerotic calcifications of the aorta. No acute pleuroparenchymal abnormality. Visualized abdomen is unremarkable. Multilevel degenerative changes of the thoracic spine. IMPRESSION: 1.  Unchanged small RIGHT hydropneumothorax. Electronically Signed   By: Valentino Saxon MD   On: 08/17/2019 08:13   DG Chest Port 1 View  Result Date: 08/16/2019 CLINICAL DATA:  Right pleural effusion. EXAM: PORTABLE CHEST 1 VIEW COMPARISON:  08/15/2019 FINDINGS: Lungs are hyperexpanded. Right pleural drain remains in place with hydropneumothorax again noted. Interval decrease in pleural gas and fluid component also appears decreased in the interval. Left lung clear. The cardiopericardial silhouette is within normal limits for size. Telemetry leads overlie the chest. IMPRESSION: 1. Interval decrease in right hydropneumothorax.  2. Similar appearance of right pleural drain. Electronically Signed   By: Misty Stanley M.D.   On: 08/16/2019 09:34   DG CHEST PORT 1 VIEW  Result Date: 08/15/2019 CLINICAL DATA:  RIGHT pleural effusion EXAM: PORTABLE CHEST 1 VIEW COMPARISON:  Chest x-ray August 14, 2019 FINDINGS: Is trachea is midline.  Cardiomediastinal contours are stable. Graded opacity in the RIGHT chest with small peripheral and apical lucency with similar appearance accounting for projection. 9 mm as compared to 7 mm greatest thickness along the RIGHT chest with similar appearance of apical component with RIGHT-sided chest tube in place. Chest tube in similar position to the previous exam. Signs of RIGHT-sided rib fractures as seen on the prior study. LEFT chest is clear. IMPRESSION: Small RIGHT pneumothorax with persistent, stable pleural fluid in the RIGHT chest compared to most recent radiograph. Pneumothorax is within 1-2 mm of previous measurement, attention on follow-up. Subtle difference could be due to projection. Chest tube in similar position. Electronically Signed   By: Zetta Bills M.D.   On: 08/15/2019 11:20    DG Chest Portable 1 View  Result Date: 08/14/2019 CLINICAL DATA:  Placement of chest tube EXAM: PORTABLE CHEST 1 VIEW COMPARISON:  CT same day 4:47 p.m. FINDINGS: The heart size and mediastinal contours are within normal limits. Aortic knob calcifications are seen. There is been interval placement of pigtail catheter within the right lower lung. There is a new small right pneumothorax present. No mediastinal shift. Hazy layering moderate pleural effusion seen at the right lung base. The left lung is clear. IMPRESSION: Interval placement of right-sided pigtail catheter. New small right pneumothorax. Small to moderate loculated right effusion. These results were called by telephone at the time of interpretation on 08/14/2019 at 9:16 pm to provider [facility] , who verbally acknowledged these results. Electronically Signed   By: Prudencio Pair M.D.   On: 08/14/2019 21:22     Assessment & Plan:   Loculated pleural effusion - S/p right pleural effusion secondary to blunt force chest trauma. Treated with chest tube managed by pulmonary/Dr. Lamonte Sakai. Breathing has improved but not back to his baseline.  - Plan checking CXR today (results pending) - If fluid has re-accumulated we will need to set patient up for in office/outpatient thoracentesis  - Encourage patient use incentive spirometry x10/hour to encourage deep breathing  - Follow-up with pulmonary as needed basis  Rheumatoid arthritis ([place]) - Patient reports weight loss and right shoulder pain.  - Shoulder pain likely from bunt chest trauma and subsequent fall. Encourage ROM exercises.  - CT chest in June 2021 showed mild chronic interstitial thickening no other compelling evidence for ILD  - Continue Plaquenil as prescribed. - Follow-up with Rheumatology  Tobacco abuse - Encouraged compete smoking cessation - taper amount and pick quit date - Patient has successfully quit smoking in the past with nicotine patches.  - Patient declined  referral to lung cancer screening program, recommend following up with PCP     Martyn Ehrich, NP 09/02/2019

## 2019-09-02 ENCOUNTER — Ambulatory Visit (INDEPENDENT_AMBULATORY_CARE_PROVIDER_SITE_OTHER): Payer: Medicare Other

## 2019-09-02 ENCOUNTER — Ambulatory Visit: Payer: Medicare Other | Admitting: Primary Care

## 2019-09-02 ENCOUNTER — Other Ambulatory Visit: Payer: Self-pay

## 2019-09-02 ENCOUNTER — Encounter: Payer: Self-pay | Admitting: Primary Care

## 2019-09-02 DIAGNOSIS — Z720 Tobacco use: Secondary | ICD-10-CM | POA: Diagnosis not present

## 2019-09-02 DIAGNOSIS — M069 Rheumatoid arthritis, unspecified: Secondary | ICD-10-CM | POA: Diagnosis not present

## 2019-09-02 DIAGNOSIS — J90 Pleural effusion, not elsewhere classified: Secondary | ICD-10-CM | POA: Diagnosis not present

## 2019-09-02 NOTE — Assessment & Plan Note (Addendum)
-   S/p right pleural effusion secondary to blunt force chest trauma. Treated with chest tube managed by pulmonary/Dr. Lamonte Sakai. Breathing has improved but not back to his baseline.  - Plan checking CXR today (results pending) - If fluid has re-accumulated we will need to set patient up for in office/outpatient thoracentesis  - Encourage patient use incentive spirometry x10/hour to encourage deep breathing  - Follow-up with pulmonary as needed basis

## 2019-09-02 NOTE — Patient Instructions (Addendum)
Nice meeting you today Kevin Leon  Recommendations: Continue to focus on small meals/high fat and protein Follow up with rheumatology as scheduled If fluid has re-accumulated we will get you set up for in office/outpaitent thoracentesis  Do range of motion shoulder exercises  Continue to use incentive spirometer once an hour to help take deep breath  Orders: CXR today- we will call with results   Follow-up: As needed basis with pulmonary or if breathing worsens          Shoulder Range of Motion Exercises Shoulder range of motion (ROM) exercises are done to keep the shoulder moving freely or to increase movement. They are often recommended for people who have shoulder pain or stiffness or who are recovering from a shoulder surgery. Phase 1 exercises When you are able, do this exercise 1-2 times per day for 30-60 seconds in each direction, or as directed by your health care provider. Pendulum exercise To do this exercise while sitting: 1. Sit in a chair or at the edge of your bed with your feet flat on the floor. 2. Let your affected arm hang down in front of you over the edge of the bed or chair. 3. Relax your shoulder, arm, and hand. [place] your body so your arm gently swings in small circles. You can also use your unaffected arm to start the motion. 5. Repeat changing the direction of the circles, swinging your arm left and right, and swinging your arm forward and back. To do this exercise while standing: 1. Stand next to a sturdy chair or table, and hold on to it with your hand on your unaffected side. 2. Bend forward at the waist. 3. Bend your knees slightly. 4. Relax your shoulder, arm, and hand. 5. While keeping your shoulder relaxed, use body motion to swing your arm in small circles. 6. Repeat changing the direction of the circles, swinging your arm left and right, and swinging your arm forward and back. 7. Between exercises, stand up tall and take a short break to  relax your lower back.  Phase 2 exercises Do these exercises 1-2 times per day or as told by your health care provider. Hold each stretch for 30 seconds, and repeat 3 times. Do the exercises with one or both arms as instructed by your health care provider. For these exercises, sit at a table with your hand and arm supported by the table. A chair that slides easily or has wheels can be helpful. External rotation 1. Turn your chair so that your affected side is nearest to the table. 2. Place your forearm on the table to your side. Bend your elbow about 90 at the elbow (right angle) and place your hand palm facing down on the table. Your elbow should be about 6 inches away from your side. 3. Keeping your arm on the table, lean your body forward. Abduction 1. Turn your chair so that your affected side is nearest to the table. 2. Place your forearm and hand on the table so that your thumb points toward the ceiling and your arm is straight out to your side. 3. Slide your hand out to the side and away from you, using your unaffected arm to do the work. 4. To increase the stretch, you can slide your chair away from the table. Flexion: forward stretch 1. Sit facing the table. Place your hand and elbow on the table in front of you. 2. Slide your hand forward and away from you, using your  unaffected arm to do the work. 3. To increase the stretch, you can slide your chair backward. Phase 3 exercises Do these exercises 1-2 times per day or as told by your health care provider. Hold each stretch for 30 seconds, and repeat 3 times. Do the exercises with one or both arms as instructed by your health care provider. Cross-body stretch: posterior capsule stretch 1. Lift your arm straight out in front of you. 2. Bend your arm 90 at the elbow (right angle) so your forearm moves across your body. 3. Use your other arm to gently pull the elbow across your body, toward your other shoulder. Wall climbs 1. Stand  with your affected arm extended out to the side with your hand resting on a door frame. 2. Slide your hand slowly up the door frame. 3. To increase the stretch, step through the door frame. Keep your body upright and do not lean. Wand exercises You will need a cane, a piece of PVC pipe, or a sturdy wooden dowel for wand exercises. Flexion To do this exercise while standing: 1. Hold the wand with both of your hands, palms down. 2. Using the other arm to help, lift your arms up and over your head, if able. 3. Push upward with your other arm to gently increase the stretch. To do this exercise while lying down: 1. Lie on your back with your elbows resting on the floor and the wand in both your hands. Your hands will be palm down, or pointing toward your feet. 2. Lift your hands toward the ceiling, using your unaffected arm to help if needed. 3. Bring your arms overhead as able, using your unaffected arm to help if needed. Internal rotation 1. Stand while holding the wand behind you with both hands. Your unaffected arm should be extended above your head with the arm of the affected side extended behind you at the level of your waist. The wand should be pointing straight up and down as you hold it. 2. Slowly pull the wand up behind your back by straightening the elbow of your unaffected arm and bending the elbow of your affected arm. External rotation 1. Lie on your back with your affected upper arm supported on a small pillow or rolled towel. When you first do this exercise, keep your upper arm close to your body. Over time, bring your arm up to a 90 angle out to the side. 2. Hold the wand across your stomach and with both hands palm up. Your elbow on your affected side should be bent at a 90 angle. 3. Use your unaffected side to help push your forearm away from you and toward the floor. Keep your elbow on your affected side bent at a 90 angle. Contact a health care provider if you have:  New or  increasing pain.  New numbness, tingling, weakness, or discoloration in your arm or hand. This information is not intended to replace advice given to you by your health care provider. Make sure you discuss any questions you have with your health care provider. Document Revised: 02/13/2017 Document Reviewed: 02/13/2017 Elsevier Patient Education  2020 [employer].

## 2019-09-02 NOTE — Assessment & Plan Note (Signed)
-   Encouraged compete smoking cessation - taper amount and pick quit date - Patient has successfully quit smoking in the past with nicotine patches.  - Patient declined referral to lung cancer screening program, recommend following up with PCP

## 2019-09-02 NOTE — Assessment & Plan Note (Addendum)
-   Patient reports weight loss and right shoulder pain.  - Shoulder pain likely from bunt chest trauma and subsequent fall. Encourage ROM exercises.  - CT chest in June 2021 showed mild chronic interstitial thickening no other compelling evidence for ILD  - Continue Plaquenil as prescribed. - Follow-up with Rheumatology

## 2019-09-03 NOTE — Progress Notes (Signed)
CXR showed stable small right pleural effusion, chronic right rib fracture. No other acute process.  No intervention

## 2019-09-28 LAB — ACID FAST CULTURE WITH REFLEXED SENSITIVITIES (MYCOBACTERIA): Acid Fast Culture: NEGATIVE

## 2019-12-21 ENCOUNTER — Other Ambulatory Visit: Payer: Self-pay

## 2019-12-21 ENCOUNTER — Telehealth: Payer: Self-pay | Admitting: Cardiovascular Disease

## 2019-12-21 MED ORDER — LISINOPRIL 5 MG PO TABS: 5.0000 mg | ORAL_TABLET | Freq: Every day | ORAL | 0 refills | Status: DC

## 2019-12-21 NOTE — Telephone Encounter (Signed)
Pt's medication was already sent to pt's pharmacy as requested. Confirmation received.  

## 2019-12-21 NOTE — Telephone Encounter (Signed)
Pt's medication was sent to pt's pharmacy as requested. Confirmation received.  °

## 2019-12-21 NOTE — Telephone Encounter (Signed)
*  STAT* If patient is at the pharmacy, call can be transferred to refill team.   1. Which medications need to be refilled? (please list name of each medication and dose if known) lisinopril (ZESTRIL) 5 MG tablet  2. Which pharmacy/location (including street and city if local pharmacy) is medication to be sent to? [id number] - [place], [place] - [address]  3. Do they need a 30 day or 90 day supply? 90 day supply  Patient states has 3 tablets remaining. He also scheduled an appointment for 04/02/19 at 10:45 AM with Richardson Dopp.

## 2020-02-21 ENCOUNTER — Other Ambulatory Visit: Payer: Self-pay | Admitting: Physician Assistant

## 2020-03-02 ENCOUNTER — Other Ambulatory Visit: Payer: Self-pay | Admitting: Physician Assistant

## 2020-03-02 ENCOUNTER — Other Ambulatory Visit: Payer: Self-pay | Admitting: Cardiovascular Disease

## 2020-03-02 ENCOUNTER — Other Ambulatory Visit: Payer: Self-pay

## 2020-03-02 NOTE — Telephone Encounter (Signed)
Pt calling requesting a refill on Lisinopril 10 mg tablet. This medication was D/C in the hospital in 08/2019, but pt states that he has still been taking this medication. Would Dr. Acie Fredrickson like to reorder this medication? please address

## 2020-03-03 MED ORDER — LISINOPRIL 5 MG PO TABS: 5.0000 mg | ORAL_TABLET | Freq: Every day | ORAL | 3 refills | Status: DC

## 2020-03-03 MED ORDER — LISINOPRIL 10 MG PO TABS
ORAL_TABLET | ORAL | 3 refills | Status: DC
Start: 2020-03-03 — End: 2021-04-19

## 2020-03-03 NOTE — Telephone Encounter (Signed)
I took care of refill. Thanks Richardson Dopp, PA-C    03/03/2020 8:42 AM

## 2020-03-22 ENCOUNTER — Encounter: Payer: Self-pay | Admitting: Physician Assistant

## 2020-03-22 ENCOUNTER — Other Ambulatory Visit: Payer: Self-pay

## 2020-03-22 ENCOUNTER — Ambulatory Visit (HOSPITAL_COMMUNITY): Payer: Medicare Other | Attending: Cardiovascular Disease

## 2020-03-22 DIAGNOSIS — I351 Nonrheumatic aortic (valve) insufficiency: Secondary | ICD-10-CM | POA: Insufficient documentation

## 2020-03-22 LAB — ECHOCARDIOGRAM COMPLETE
Area-P 1/2: 3.65 cm2
S' Lateral: 3 cm

## 2020-03-31 NOTE — Progress Notes (Addendum)
Cardiology Office Note:    Date:  04/01/2020   ID:  GAL FELDHAUS, DOB 1950/08/27, MRN [id number]  PCP:  Vicenta Aly, [facility]  Cardiologist:  Mertie Moores, MD  Advanced Practice Provider:  Liliane Shi, PA-C Electrophysiologist:  None       Referring MD: Vicenta Aly, FNP   Chief Complaint:  Follow-up (AFib, HTN)    Patient Profile:    Kevin Leon is a 70 y.o. male with:   Paroxysmal atrial fibrillation ? Prior subdural hematoma>>no longer on anticoagulation  Aortic insufficiency  ? Echocardiogram 02/2019: EF 60-65, Gr 2 DD mild to mod AI  Hypertension  Hepatitis C  Tobacco abuse  Hyponatremia  Anxiety  Rheumatoid Arthritis  Latent TB Infection   Palpitations ? Monitor 02/2019: NSR, 4 bt NSVT, 6 bt SVT  Prior CV studies: Echocardiogram 03/22/20 EF 60-65, no RWMA, normal RVSF, mild AI, AV sclerosis w/o AS   Echocardiogram 02/18/2019 EF 60-65, Gr 2 DD, no RWMA, mild LAE, trivial MR, trivial Tr, mild to mod AI  3-14 day monitor 01/2019 Normal sinus rhythm, 4 beat NSVT, single episode of SVT (6 beats)  Echo 08/30/15 EF 60-65%, normal wall motion, mild MR  History of Present Illness:    Kevin Leon was last seen in 3/21. He returns for f/u.  He was admitted in 7/21 R sided loculated pleural effusion felt to be due to recent chest trauma (kicked by horse).  He was treated with a chest tube.  He returns for follow-up.  He is here alone.  It took him a long time to recover from his chest injury.  He went to physical therapy for several weeks and was just discharged last week.  He did note some right calf pain when he started physical therapy.  His legs have also felt heavy.  He has not had substernal chest discomfort, shortness of breath, syncope, palpitations or leg edema.      Past Medical History:  Diagnosis Date  . Anxiety   . Aortic insufficiency    Echo 02/2019: EF 60-65, Gr 2 DD, normal wall motion,  normal RV SF, mild LAE, trivial MR/TR, mild to moderate AI// Echocardiogram 3/22: EF 60-65, no RWMA, normal RVSF, mild AI, AV sclerosis w/o AS   . Arthritis   . Essential hypertension 10/04/2015  . Hepatitis C   . History of stress test    ETT-Echo in 2010 was normal  . History of subdural hematoma 08/15/2016  . HTN (hypertension)   . Persistent atrial fibrillation ([place]) 09/09/2015   CHADS2-VASc=2 //  Apixaban //  s/p DCCV 09/14/15 // Monitor 01/2019: NSR, single NSVT (4 beats); single episode of SVT (6 beats)  . Right rotator cuff tear 10/08/2014  . Tobacco abuse 10/04/2015  . Tuberculosis     Current Medications: Current Meds  Medication Sig  . carvedilol (COREG) 6.25 MG tablet TAKE 1 TABLET BY MOUTH TWICE A DAY WITH MEALS  . folic acid (FOLVITE) 1 MG tablet Take 1 mg by mouth daily.  Marland Kitchen lisinopril (ZESTRIL) 10 MG tablet Take 1 tablet daily with the 5 mg tab to equal 15 mg once daily.  Marland Kitchen lisinopril (ZESTRIL) 5 MG tablet Take 1 tablet (5 mg total) by mouth daily. Take 1 tablet daily along with the 10 mg tab to equal 15 mg once daily.  Marland Kitchen LORazepam (ATIVAN) 0.5 MG tablet Take 0.5 mg by mouth every 6 (six) hours.  . Methotrexate 10 MG/0.4ML SOSY  Inject into the skin.  Vladimir Faster Glyc-Propyl Glyc PF (SYSTANE ULTRA PF) 0.4-0.3 % SOLN Place 1 drop into both eyes daily as needed (for dryness/irritation).     Allergies:   Isoniazid and Priftin [rifapentine]   Social History   Tobacco Use  . Smoking status: Current Every Day Smoker    Packs/day: 0.50    Types: Cigarettes    Last attempt to quit: 02/15/2014    Years since quitting: 6.1  . Smokeless tobacco: Never Used  . Tobacco comment: smokes 10 per day, trying to quit.  Vaping Use  . Vaping Use: Never used  Substance Use Topics  . Alcohol use: Not Currently    Alcohol/week: 0.0 standard drinks  . 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 There is no history of Heart attack.  Review of Systems  Cardiovascular: Positive for claudication (R calf).     EKGs/Labs/Other Test Reviewed:    EKG:  EKG is   ordered today.  The ekg ordered today demonstrates n/a  Recent Labs: 08/16/2019: Hemoglobin 10.1; Platelets 296; TSH 1.151 08/17/2019: BUN 10; Creatinine, Ser 0.82; Magnesium 1.9; Potassium 4.1; Sodium 129   Recent Lipid Panel Lab Results  Component Value Date/Time   CHOL 151 04/03/2013 08:40 AM   TRIG 19.0 04/03/2013 08:40 AM   HDL 84.50 04/03/2013 08:40 AM   CHOLHDL 2 04/03/2013 08:40 AM   LDLCALC 63 04/03/2013 08:40 AM      Risk Assessment/Calculations:    CHA2DS2-VASc Score = 2  This indicates a 2.2% annual risk of stroke. The patient's score is based upon: CHF History: No HTN History: Yes Diabetes History: No Stroke History: No Vascular Disease History: No Age Score: 1 Gender Score: 0     Physical Exam:    VS:  BP (!) 160/82   Pulse 61   Ht 6' (1.829 m)   Wt 152 lb 9.6 oz (69.2 kg)   SpO2 96%   BMI 20.70 kg/m     Wt Readings from Last 3 Encounters:  04/01/20 152 lb 9.6 oz (69.2 kg)  09/02/19 132 lb 9.6 oz (60.1 kg)  08/17/19 144 lb 2.9 oz (65.4 kg)     Constitutional:      Appearance: Healthy appearance. Not in distress.  Neck:     Vascular: No carotid bruit. JVD normal.  Pulmonary:     Effort: Pulmonary effort is normal.     Breath sounds: No wheezing. No rales.  Cardiovascular:     Normal rate. Regular rhythm. Normal S1. Normal S2.     Murmurs: There is no murmur.     Comments: DP/PT diminished bilat; no FA bruits Edema:    Peripheral edema absent.  Abdominal:     Palpations: Abdomen is soft. There is no hepatomegaly.  Skin:    General: Skin is warm and dry.  Neurological:     General: No focal deficit present.     Mental Status: Alert and oriented to person, place and time.     Cranial Nerves: Cranial nerves are intact.       ASSESSMENT & PLAN:    1. Essential hypertension Blood pressures at home have been optimal.  They usually run high here in the office.  Continue current management.  2. PAF (paroxysmal atrial fibrillation) (HCC) No apparent recurrence.  He was taken off of anticoagulation years  ago when he developed a subdural hematoma.  3. Nonrheumatic aortic valve insufficiency Recent echocardiogram reviewed.  He had mild aortic insufficiency.  We can repeat another echocardiogram in 2 to 3 years.  4. Claudication [facility]) He continues to smoke and has noted some leg heaviness as well as right calf discomfort with ambulation.  I have difficulty palpating his pulses on exam.  I do not hear any femoral artery bruits.  I will obtain ABIs, arterial Dopplers.  5. Healthcare maintenance 10-year ASCVD risk is 22%.  His sister just had a stent placed.  We discussed the rationale and benefit of starting on statin therapy.  He prefers to hold off on starting statin therapy at this time.  If his ABIs do demonstrate evidence of PAD, I would suggest he start on statin therapy as well as initiate aspirin therapy.  6. Tobacco abuse I have recommended cessation.   Dispo:  Return in about 1 year (around 04/01/2021) for Routine Follow Up, w/ Dr. Acie Fredrickson, or Richardson Dopp, PA-C, in person.   Medication Adjustments/Labs and Tests Ordered: Current medicines are reviewed at length with the patient today.  Concerns regarding medicines are outlined above.  Tests Ordered: Orders Placed This Encounter  Procedures  . VAS [place] LOWER EXTREMITY ARTERIAL DUPLEX  . VAS [place] ABI WITH/WO TBI   Medication Changes: No orders of the defined types were placed in this encounter.   Signed, Richardson Dopp, PA-C  04/01/2020 1:12 PM    [facility] [place], [place],   [address] Phone: [phone]; Fax: [phone]

## 2020-04-01 ENCOUNTER — Encounter: Payer: Self-pay | Admitting: Physician Assistant

## 2020-04-01 ENCOUNTER — Other Ambulatory Visit: Payer: Self-pay

## 2020-04-01 ENCOUNTER — Ambulatory Visit: Payer: Medicare Other | Admitting: Physician Assistant

## 2020-04-01 VITALS — BP 160/82 | HR 61 | Ht 72.0 in | Wt 152.6 lb

## 2020-04-01 DIAGNOSIS — I739 Peripheral vascular disease, unspecified: Secondary | ICD-10-CM | POA: Diagnosis not present

## 2020-04-01 DIAGNOSIS — I480 Paroxysmal atrial fibrillation: Secondary | ICD-10-CM | POA: Diagnosis not present

## 2020-04-01 DIAGNOSIS — Z0000 Encounter for general adult medical examination without abnormal findings: Secondary | ICD-10-CM

## 2020-04-01 DIAGNOSIS — I10 Essential (primary) hypertension: Secondary | ICD-10-CM

## 2020-04-01 DIAGNOSIS — M069 Rheumatoid arthritis, unspecified: Secondary | ICD-10-CM

## 2020-04-01 DIAGNOSIS — I351 Nonrheumatic aortic (valve) insufficiency: Secondary | ICD-10-CM

## 2020-04-01 DIAGNOSIS — Z720 Tobacco use: Secondary | ICD-10-CM

## 2020-04-01 NOTE — Patient Instructions (Addendum)
Medication Instructions:  Your physician recommends that you continue on your current medications as directed. Please refer to the Current Medication list given to you today.  *If you need a refill on your cardiac medications before your next appointment, please call your pharmacy*   Lab Work: -None  If you have labs (blood work) drawn today and your tests are completely normal, you will receive your results only by: Marland Kitchen MyChart Message (if you have MyChart) OR . A paper copy in the mail If you have any lab test that is abnormal or we need to change your treatment, we will call you to review the results.   Testing/Procedures: Monday, April 4 @ 10:00 at the [facility] office.  Your physician has requested that you have an ankle brachial index (ABI). During this test an ultrasound and blood pressure cuff are used to evaluate the arteries that supply the arms and legs with blood. Allow thirty minutes for this exam. There are no restrictions or special instructions.  Your physician has requested that you have a lower extremity arterial exercise duplex. During this test, exercise and ultrasound are used to evaluate arterial blood flow in the legs. Allow one hour for this exam. There are no restrictions or special instructions.    Follow-Up: At [facility], you and your health needs are our priority.  As part of our continuing mission to provide you with exceptional heart care, we have created designated Provider Care Teams.  These Care Teams include your primary Cardiologist (physician) and Advanced Practice Providers (APPs -  Physician Assistants and Nurse Practitioners) who all work together to provide you with the care you need, when you need it.  We recommend signing up for the patient portal called "MyChart".  Sign up information is provided on this After Visit Summary.  MyChart is used to connect with patients for Virtual Visits (Telemedicine).  Patients are able to view lab/test results,  encounter notes, upcoming appointments, etc.  Non-urgent messages can be sent to your provider as well.   To learn more about what you can do with MyChart, go to [email or web address].    Your next appointment:   1 year(s)  The format for your next appointment:   In Person  Provider:   You will see one of the following Advanced Practice Providers on your designated Care Team:    Richardson Dopp, PA-C     Other Instructions Your physician wants you to follow-up in: 1 year with Dr. Acie Fredrickson or Richardson Dopp, [place].  You will receive a reminder letter in the mail two months in advance. If you don't receive a letter, please call our office to schedule the follow-up appointment.

## 2020-04-18 ENCOUNTER — Other Ambulatory Visit: Payer: Self-pay

## 2020-04-18 ENCOUNTER — Ambulatory Visit (HOSPITAL_COMMUNITY)
Admission: RE | Admit: 2020-04-18 | Discharge: 2020-04-18 | Disposition: A | Discharge status: Home / Self Care | Payer: Medicare Other | Source: Ambulatory Visit | Attending: Cardiology | Admitting: Cardiology

## 2020-04-18 DIAGNOSIS — I10 Essential (primary) hypertension: Secondary | ICD-10-CM | POA: Diagnosis not present

## 2020-04-18 DIAGNOSIS — I739 Peripheral vascular disease, unspecified: Secondary | ICD-10-CM | POA: Insufficient documentation

## 2020-04-18 DIAGNOSIS — Z720 Tobacco use: Secondary | ICD-10-CM | POA: Insufficient documentation

## 2020-04-18 DIAGNOSIS — Z0000 Encounter for general adult medical examination without abnormal findings: Secondary | ICD-10-CM | POA: Diagnosis present

## 2020-04-18 DIAGNOSIS — I351 Nonrheumatic aortic (valve) insufficiency: Secondary | ICD-10-CM | POA: Diagnosis present

## 2020-04-18 DIAGNOSIS — I480 Paroxysmal atrial fibrillation: Secondary | ICD-10-CM | POA: Diagnosis present

## 2020-04-19 ENCOUNTER — Encounter: Payer: Self-pay | Admitting: Physician Assistant

## 2020-04-19 ENCOUNTER — Other Ambulatory Visit: Payer: Self-pay | Admitting: *Deleted

## 2020-04-19 DIAGNOSIS — I739 Peripheral vascular disease, unspecified: Secondary | ICD-10-CM

## 2020-04-19 MED ORDER — ASPIRIN EC 81 MG PO TBEC: 81.0000 mg | DELAYED_RELEASE_TABLET | Freq: Every day | ORAL | 3 refills | Status: AC

## 2020-04-19 MED ORDER — ROSUVASTATIN CALCIUM 20 MG PO TABS: 20.0000 mg | ORAL_TABLET | Freq: Every day | ORAL | 11 refills | Status: DC

## 2020-05-03 ENCOUNTER — Ambulatory Visit: Payer: Medicare Other | Admitting: Cardiovascular Disease

## 2020-05-03 ENCOUNTER — Encounter: Payer: Self-pay | Admitting: Cardiovascular Disease

## 2020-05-03 ENCOUNTER — Other Ambulatory Visit: Payer: Self-pay

## 2020-05-03 VITALS — BP 160/88 | HR 70 | Ht 72.0 in | Wt 156.4 lb

## 2020-05-03 DIAGNOSIS — E785 Hyperlipidemia, unspecified: Secondary | ICD-10-CM

## 2020-05-03 DIAGNOSIS — I480 Paroxysmal atrial fibrillation: Secondary | ICD-10-CM

## 2020-05-03 DIAGNOSIS — Z720 Tobacco use: Secondary | ICD-10-CM | POA: Diagnosis not present

## 2020-05-03 DIAGNOSIS — I739 Peripheral vascular disease, unspecified: Secondary | ICD-10-CM

## 2020-05-03 NOTE — Progress Notes (Signed)
Cardiology Office Note   Date:  05/03/2020   ID:  ELIGAH Kevin Leon, DOB 07-Aug-1950, MRN [id number]  PCP:  Vicenta Aly, FNP  Cardiologist:  Dr. Acie Fredrickson  No chief complaint on file.     History of Present Illness: Kevin Leon is a 70 y.o. male who was referred by Richardson Leon for evaluation management of peripheral arterial disease. He has known history of paroxysmal atrial fibrillation with prior subdural hematoma currently not on anticoagulation, essential hypertension, hepatitis C, tobacco use, hyponatremia and latent TB infection. He recently complained of right leg claudication.  He reports right calf claudication that started about 1 year ago and has been relatively stable overall.  It happens after walking about 1 block.  It happens sooner if he goes uphill.  He has no rest pain or lower extremity ulceration.  This was most noticeable when he started doing physical therapy after an injury he had last year.  Recent noninvasive vascular studies showed an ABI of 0.63 on the right and 1.0 on the left.  Duplex showed longer occlusion of right SFA with reconstitution in the mid popliteal artery with an occluded posterior tibial artery below the knee.  There was moderate disease in the left SFA.  He is not diabetic.  He smokes 10 cigarettes a day and wants to quit smoking.  Past Medical History:  Diagnosis Date  . Anxiety   . Aortic insufficiency    Echo 02/2019: EF 60-65, Gr 2 DD, normal wall motion, normal RV SF, mild LAE, trivial MR/TR, mild to moderate AI// Echocardiogram 3/22: EF 60-65, no RWMA, normal RVSF, mild AI, AV sclerosis w/o AS   . Arthritis   . Essential hypertension 10/04/2015  . Hepatitis C   . History of stress test    ETT-Echo in 2010 was normal  . History of subdural hematoma 08/15/2016  . HTN (hypertension)   . PAD (peripheral artery disease) (HCC)    ABIs/LE arterial dopplers 4/22: R 0.63; L 1.0 / Long occlusion R SFA thru prox popliteal artery; R PTA  occluded / L SFA 50-74   . Persistent atrial fibrillation ([place]) 09/09/2015   CHADS2-VASc=2 //  Apixaban //  s/p DCCV 09/14/15 // Monitor 01/2019: NSR, single NSVT (4 beats); single episode of SVT (6 beats)  . Right rotator cuff tear 10/08/2014  . Tobacco abuse 10/04/2015  . Tuberculosis     Past Surgical History:  Procedure Laterality Date  . CARDIOVERSION [name] 09/14/2015   Procedure: CARDIOVERSION;  Surgeon: Thayer Headings, MD;  Location: [facility] ENDOSCOPY;  Service: Cardiovascular;  Laterality: [name];  . large piece of wood removed from buttocks     as child  . SHOULDER ARTHROSCOPY WITH ROTATOR CUFF REPAIR Right 10/08/2014   Procedure: RIGHT SHOULDER ARTHROSCOPY, DEBRIDEMENT, ACROMIOPLASTY WITH ROTATOR CUFF REPAIR;  Surgeon: Marchia Bond, MD;  Location: [facility];  Service: Orthopedics;  Laterality: Right;  . TONSILLECTOMY       Current Outpatient Medications  Medication Sig Dispense Refill  . aspirin EC 81 MG tablet Take 1 tablet (81 mg total) by mouth daily. Swallow whole. 90 tablet 3  . carvedilol (COREG) 6.25 MG tablet TAKE 1 TABLET BY MOUTH TWICE A DAY WITH MEALS 144 tablet 3  . folic acid (FOLVITE) 1 MG tablet Take 1 mg by mouth daily.    Marland Kitchen lisinopril (ZESTRIL) 10 MG tablet Take 1 tablet daily with the 5 mg tab to equal 15 mg once daily. 90 tablet 3  . lisinopril (  ZESTRIL) 5 MG tablet Take 1 tablet (5 mg total) by mouth daily. Take 1 tablet daily along with the 10 mg tab to equal 15 mg once daily. 90 tablet 3  . LORazepam (ATIVAN) 0.5 MG tablet Take 0.5 mg by mouth every 6 (six) hours.    . Methotrexate 10 MG/0.4ML SOSY Inject into the skin.    Vladimir Faster Glyc-Propyl Glyc PF (SYSTANE ULTRA PF) 0.4-0.3 % SOLN Place 1 drop into both eyes daily as needed (for dryness/irritation).    . rosuvastatin (CRESTOR) 20 MG tablet Take 1 tablet (20 mg total) by mouth daily. 30 tablet 11   No current facility-administered medications for this visit.    Allergies:   Isoniazid and  Priftin [rifapentine]    Social History:  The patient  reports that he has been smoking cigarettes. He has been smoking about 0.50 packs per day. He has never used smokeless tobacco. He reports previous alcohol use. He reports that he does not use drugs.   Family History:  The patient's family history includes Hypertension in his mother.    ROS:  Please see the history of present illness.   Otherwise, review of systems are positive for none.   All other systems are reviewed and negative.    PHYSICAL EXAM: VS:  BP (!) 160/88   Pulse 70   Ht 6' (1.829 m)   Wt 156 lb 6.4 oz (70.9 kg)   SpO2 95%   BMI 21.21 kg/m  , BMI Body mass index is 21.21 kg/m. GEN: Well nourished, well developed, in no acute distress  HEENT: normal  Neck: no JVD, carotid bruits, or masses Cardiac: RRR; no murmurs, rubs, or gallops,no edema  Respiratory:  clear to auscultation bilaterally, normal work of breathing GI: soft, nontender, nondistended, + BS MS: no deformity or atrophy  Skin: warm and dry, no rash Neuro:  Strength and sensation are intact Psych: euthymic mood, full affect Vascular: Femoral pulse: Normal bilaterally.  Distal pulses are normal on the left side but not palpable on the right.   EKG:  EKG is ordered today. The ekg ordered today demonstrates normal sinus rhythm with no significant ST or T wave changes.   Recent Labs: 08/16/2019: Hemoglobin 10.1; Platelets 296; TSH 1.151 08/17/2019: BUN 10; Creatinine, Ser 0.82; Magnesium 1.9; Potassium 4.1; Sodium 129    Lipid Panel    Component Value Date/Time   CHOL 151 04/03/2013 0840   TRIG 19.0 04/03/2013 0840   HDL 84.50 04/03/2013 0840   CHOLHDL 2 04/03/2013 0840   VLDL 3.8 04/03/2013 0840   LDLCALC 63 04/03/2013 0840      Wt Readings from Last 3 Encounters:  05/03/20 156 lb 6.4 oz (70.9 kg)  04/01/20 152 lb 9.6 oz (69.2 kg)  09/02/19 132 lb 9.6 oz (60.1 kg)       No flowsheet data found.    ASSESSMENT AND PLAN:  1.   Peripheral arterial disease: Moderate to severe right calf claudication due to an occluded right SFA.  I had prolonged discussion with him about natural history of claudication.  This is not a limb threatening situation.  Given that the occlusion involves the whole length of the SFA, chance of endovascular success and long-term patency is overall low.  Femoral-popliteal bypass has a better long-term patency but his symptoms are not severe enough to justify going through surgery.  His symptoms do not seem to be lifestyle limiting.  I agree with initiation of aspirin and statin therapy. I discussed with  him the importance of starting a walking exercise program and he will do that.  2.  Paroxysmal atrial fibrillation: Not on anticoagulation due to prior subdural hematoma  3.  Tobacco use: I discussed with him the importance of quitting smoking in order to decrease the chances of PAD progression.  He is motivated to quit and we provided him with instructions.  4.  Hyperlipidemia: I agree with starting rosuvastatin with a target LDL of less than 70 given recent diagnosis of peripheral arterial disease.    Disposition:   FU with me in 6 months  Signed,  Kathlyn Sacramento, MD  05/03/2020 9:21 AM    [place]

## 2020-05-03 NOTE — Patient Instructions (Signed)
Medication Instructions:  No changes *If you need a refill on your cardiac medications before your next appointment, please call your pharmacy*   Lab Work: None ordered If you have labs (blood work) drawn today and your tests are completely normal, you will receive your results only by: Marland Kitchen MyChart Message (if you have MyChart) OR . A paper copy in the mail If you have any lab test that is abnormal or we need to change your treatment, we will call you to review the results.   Testing/Procedures: None ordered   Follow-Up: At [facility], you and your health needs are our priority.  As part of our continuing mission to provide you with exceptional heart care, we have created designated Provider Care Teams.  These Care Teams include your primary Cardiologist (physician) and Advanced Practice Providers (APPs -  Physician Assistants and Nurse Practitioners) who all work together to provide you with the care you need, when you need it.  We recommend signing up for the patient portal called "MyChart".  Sign up information is provided on this After Visit Summary.  MyChart is used to connect with patients for Virtual Visits (Telemedicine).  Patients are able to view lab/test results, encounter notes, upcoming appointments, etc.  Non-urgent messages can be sent to your provider as well.   To learn more about what you can do with MyChart, go to [email or web address].    Your next appointment:   6 month(s)  The format for your next appointment:   In Person  Provider:   Dr. Fletcher Anon   Other Instructions  Managing the Challenge of Quitting Smoking Quitting smoking is a physical and mental challenge. You will face cravings, withdrawal symptoms, and temptation. Before quitting, work with your health care provider to make a plan that can help you manage quitting. Preparation can help you quit and keep you from giving in. How to manage lifestyle changes Managing stress Stress can make you want  to smoke, and wanting to smoke may cause stress. It is important to find ways to manage your stress. You might try some of the following:  Practice relaxation techniques. ? Breathe slowly and deeply, in through your nose and out through your mouth. ? Listen to music. ? Soak in a bath or take a shower. ? Imagine a peaceful place or vacation.  Get some support. ? Talk with family or friends about your stress. ? Join a support group. ? Talk with a counselor or therapist.  Get some physical activity. ? Go for a walk, run, or bike ride. ? Play a favorite sport. ? Practice yoga.   Medicines Talk with your health care provider about medicines that might help you deal with cravings and make quitting easier for you. Relationships Social situations can be difficult when you are quitting smoking. To manage this, you can:  Avoid parties and other social situations where people might be smoking.  Avoid alcohol.  Leave right away if you have the urge to smoke.  Explain to your family and friends that you are quitting smoking. Ask for support and let them know you might be a bit grumpy.  Plan activities where smoking is not an option. General instructions Be aware that many people gain weight after they quit smoking. However, not everyone does. To keep from gaining weight, have a plan in place before you quit and stick to the plan after you quit. Your plan should include:  Having healthy snacks. When you have a craving, it may help to: ?  Eat popcorn, carrots, celery, or other cut vegetables. ? Chew sugar-free gum.  Changing how you eat. ? Eat small portion sizes at meals. ? Eat 4-6 small meals throughout the day instead of 1-2 large meals a day. ? Be mindful when you eat. Do not watch television or do other things that might distract you as you eat.  Exercising regularly. ? Make time to exercise each day. If you do not have time for a long workout, do short bouts of exercise for 5-10  minutes several times a day. ? Do some form of strengthening exercise, such as weight lifting. ? Do some exercise that gets your heart beating and causes you to breathe deeply, such as walking fast, running, swimming, or biking. This is very important.  Drinking plenty of water or other low-calorie or no-calorie drinks. Drink 6-8 glasses of water daily.   How to recognize withdrawal symptoms Your body and mind may experience discomfort as you try to get used to not having nicotine in your system. These effects are called withdrawal symptoms. They may include:  Feeling hungrier than normal.  Having trouble concentrating.  Feeling irritable or restless.  Having trouble sleeping.  Feeling depressed.  Craving a cigarette. To manage withdrawal symptoms:  Avoid places, people, and activities that trigger your cravings.  Remember why you want to quit.  Get plenty of sleep.  Avoid coffee and other caffeinated drinks. These may worsen some of your symptoms. These symptoms may surprise you. But be assured that they are normal to have when quitting smoking. How to manage cravings Come up with a plan for how to deal with your cravings. The plan should include the following:  A definition of the specific situation you want to deal with.  An alternative action you will take.  A clear idea for how this action will help.  The name of someone who might help you with this. Cravings usually last for 5-10 minutes. Consider taking the following actions to help you with your plan to deal with cravings:  Keep your mouth busy. ? Chew sugar-free gum. ? Suck on hard candies or a straw. ? Brush your teeth.  Keep your hands and body busy. ? Change to a different activity right away. ? Squeeze or play with a ball. ? Do an activity or a hobby, such as making bead jewelry, practicing needlepoint, or working with wood. ? Mix up your normal routine. ? Take a short exercise break. Go for a quick  walk or run up and down stairs.  Focus on doing something kind or helpful for someone else.  Call a friend or family member to talk during a craving.  Join a support group.  Contact a quitline. Where to find support To get help or find a support group:  Call the [facility]'s Smoking Quitline: 1-800-QUIT NOW 515-063-0681)  Visit the website of the Substance Abuse and [place]: ktimeonline.com  Text QUIT to SmokefreeTXT: 536144 Where to find more information Visit these websites to find more information on quitting smoking:  [place]: www.smokefree.gov  American Lung Association: www.lung.org  American Cancer Society: www.cancer.org  Centers for Disease Control and Prevention: http://www.wolf.info/  American Heart Association: www.heart.org Contact a health care provider if:  You want to change your plan for quitting.  The medicines you are taking are not helping.  Your eating feels out of control or you cannot sleep. Get help right away if:  You feel depressed or become very anxious.  Summary  Quitting smoking is a physical and mental challenge. You will face cravings, withdrawal symptoms, and temptation to smoke again. Preparation can help you as you go through these challenges.  Try different techniques to manage stress, handle social situations, and prevent weight gain.  You can deal with cravings by keeping your mouth busy (such as by chewing gum), keeping your hands and body busy, calling family or friends, or contacting a quitline for people who want to quit smoking.  You can deal with withdrawal symptoms by avoiding places where people smoke, getting plenty of rest, and avoiding drinks with caffeine. This information is not intended to replace advice given to you by your health care provider. Make sure you discuss any questions you have with your health care provider. Document Revised: 10/21/2018 Document Reviewed:  10/21/2018 Elsevier Patient Education  [place].

## 2020-06-08 ENCOUNTER — Telehealth: Payer: Self-pay | Admitting: Physician Assistant

## 2020-06-08 NOTE — Telephone Encounter (Signed)
PT is calling to get a appt with scott weaver.Advised the next available is 09/13/2020.Pt requested a phone note be sent to see where he can be worked in at

## 2020-06-10 NOTE — Telephone Encounter (Signed)
S/w pt to schedule appointment for June 8 with Richardson Dopp, PA.  Pt was seen yesterday by PCP and was given a EKG and lab work.  Sent message to [facility] to get records for upcoming visit.

## 2020-06-22 ENCOUNTER — Other Ambulatory Visit: Payer: Self-pay

## 2020-06-22 ENCOUNTER — Ambulatory Visit: Payer: Medicare Other | Admitting: Physician Assistant

## 2020-06-22 ENCOUNTER — Encounter: Payer: Self-pay | Admitting: Physician Assistant

## 2020-06-22 VITALS — BP 134/64 | HR 68 | Ht 72.0 in | Wt 156.0 lb

## 2020-06-22 DIAGNOSIS — I480 Paroxysmal atrial fibrillation: Secondary | ICD-10-CM

## 2020-06-22 DIAGNOSIS — R002 Palpitations: Secondary | ICD-10-CM | POA: Diagnosis not present

## 2020-06-22 DIAGNOSIS — E7800 Pure hypercholesterolemia, unspecified: Secondary | ICD-10-CM

## 2020-06-22 DIAGNOSIS — I739 Peripheral vascular disease, unspecified: Secondary | ICD-10-CM

## 2020-06-22 DIAGNOSIS — I10 Essential (primary) hypertension: Secondary | ICD-10-CM

## 2020-06-22 MED ORDER — CARVEDILOL 6.25 MG PO TABS: 9.3750 mg | ORAL_TABLET | Freq: Two times a day (BID) | ORAL | 3 refills | Status: DC

## 2020-06-22 NOTE — Progress Notes (Signed)
Cardiology Office Note:    Date:  06/22/2020   ID:  Kevin Leon, DOB [birth year], MRN [id number]  PCP:  Vicenta Aly, [place] Providers Cardiologist:  Mertie Moores, MD Cardiology APP:  Sharmon Revere      Referring MD: Vicenta Aly, FNP   Chief Complaint:  Palpitations    Patient Profile:    Kevin Leon is a 70 y.o. male with:   Paroxysmal atrial fibrillation ? Prior subdural hematoma>>no longer on anticoagulation  Aortic insufficiency  ? Echocardiogram 02/2019: EF 60-65, Gr 2 DD mild to mod AI  Peripheral arterial disease   Hypertension  Hepatitis C  Tobacco abuse  Hyponatremia  Anxiety  Rheumatoid Arthritis  Latent TB Infection  Palpitations ? Monitor 02/2019: NSR, 4 bt NSVT, 6 bt SVT  R pleural effusion (loculated) 07/2019 2/2 chest trauma (horse kick)  S/p chest tube   Prior CV studies: LE arterial US 04/2020 Summary:  Right: Total occlusion noted in the superficial femoral artery and/or  popliteal artery. Total occlusion noted in the posterior tibial artery.  Long right SFA through proximal popliteal artery occlusion, with  reconstitution in the mid popliteal artery.  Occluded proximal to mid right PTA.   Left: 50-74% stenosis noted in the superficial femoral artery. Focal  distal left SFA stenosis.   Echocardiogram 03/22/20 EF 60-65, no RWMA, normal RVSF, mild AI, AV sclerosis w/o AS   Echocardiogram 02/18/2019 EF 60-65, Gr 2 DD, no RWMA, mild LAE, trivial MR, trivial Tr, mild to mod AI  3-14 day monitor 01/2019 Normal sinus rhythm, 4 beat NSVT, single episode of SVT (6 beats)  Echo 08/30/15 EF 60-65%, normal wall motion, mild MR   History of Present Illness: Mr. Bendorf was last seen in 3/22.  I set him up for arterial ultrasound due to lower extremity claudication.  This demonstrated right SFA occlusion and moderate disease in the left SFA.  I referred him to Dr. Fletcher Anon.  His disease was not felt to be  limb threatening in his symptoms or not significantly debilitating.  Therefore, conservative management was recommended with a walking program.  He was recently seen by primary care for palpitations.  Result note for electrocardiogram in care everywhere indicates he was in normal sinus rhythm.  He returns for evaluation.  He is here alone.  He had brief rapid palpitations for several days in a row.  These have resolved now.  Really had any chest discomfort.  He has not had any exertional chest symptoms.  He has not had shortness of breath, orthopnea or leg edema.  He has not had syncope.    Past Medical History:  Diagnosis Date  . Anxiety   . Aortic insufficiency    Echo 02/2019: EF 60-65, Gr 2 DD, normal wall motion, normal RV SF, mild LAE, trivial MR/TR, mild to moderate AI// Echocardiogram 3/22: EF 60-65, no RWMA, normal RVSF, mild AI, AV sclerosis w/o AS   . Arthritis   . Essential hypertension 10/04/2015  . Hepatitis C   . History of stress test    ETT-Echo in 2010 was normal  . History of subdural hematoma 08/15/2016  . HTN (hypertension)   . PAD (peripheral artery disease) (HCC)    ABIs/LE arterial dopplers 4/22: R 0.63; L 1.0 / Long occlusion R SFA thru prox popliteal artery; R PTA occluded / L SFA 50-74   . Persistent atrial fibrillation ([place]) 09/09/2015   CHADS2-VASc=2 //  Apixaban //  s/p DCCV 09/14/15 //  Monitor 01/2019: NSR, single NSVT (4 beats); single episode of SVT (6 beats)  . Right rotator cuff tear 10/08/2014  . Tobacco abuse 10/04/2015  . Tuberculosis     Current Medications: Current Meds  Medication Sig  . aspirin EC 81 MG tablet Take 1 tablet (81 mg total) by mouth daily. Swallow whole.  . folic acid (FOLVITE) 1 MG tablet Take 1 mg by mouth daily.  Marland Kitchen lisinopril (ZESTRIL) 10 MG tablet Take 1 tablet daily with the 5 mg tab to equal 15 mg once daily.  Marland Kitchen lisinopril (ZESTRIL) 5 MG tablet Take 1 tablet (5 mg total) by mouth daily. Take 1 tablet daily along with the 10 mg tab  to equal 15 mg once daily.  Marland Kitchen LORazepam (ATIVAN) 0.5 MG tablet Take 0.5 mg by mouth every 6 (six) hours.  . Methotrexate 10 MG/0.4ML SOSY Inject into the skin.  Vladimir Faster Glyc-Propyl Glyc PF (SYSTANE ULTRA PF) 0.4-0.3 % SOLN Place 1 drop into both eyes daily as needed (for dryness/irritation).  . rosuvastatin (CRESTOR) 20 MG tablet Take 1 tablet (20 mg total) by mouth daily.  . [DISCONTINUED] carvedilol (COREG) 6.25 MG tablet TAKE 1 TABLET BY MOUTH TWICE A DAY WITH MEALS     Allergies:   Isoniazid and Priftin [rifapentine]   Social History   Tobacco Use  . Smoking status: Current Every Day Smoker    Packs/day: 0.50    Types: Cigarettes    Last attempt to quit: 02/15/2014    Years since quitting: 6.3  . Smokeless tobacco: Never Used  . Tobacco comment: smokes 10 per day, trying to quit.  Vaping Use  . Vaping Use: Never used  Substance Use Topics  . Alcohol use: Not Currently    Alcohol/week: 0.0 standard drinks  . 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 There is no history of Heart attack.  Review of Systems  Cardiovascular: Positive for claudication. Cyanosis: overall improved.     EKGs/Labs/Other Test Reviewed:    EKG:  EKG is not ordered today.  The ekg ordered today demonstrates n/a  Recent Labs: 08/16/2019: Hemoglobin 10.1; Platelets 296; TSH 1.151 08/17/2019: BUN 10; Creatinine, Ser 0.82; Magnesium 1.9; Potassium 4.1; Sodium 129   Recent Lipid Panel Lab Results  Component Value Date/Time   CHOL 151 04/03/2013 08:40 AM   TRIG 19.0 04/03/2013 08:40 AM   HDL 84.50 04/03/2013 08:40 AM   LDLCALC 63 04/03/2013 08:40 AM      Risk Assessment/Calculations:    CHA2DS2-VASc Score = 2  This indicates a 2.2% annual risk of stroke. The patient's score is based upon: CHF History: No HTN History: Yes Diabetes History: No Stroke History: No Vascular  Disease History: No Age Score: 1 Gender Score: 0     Physical Exam:    VS:  BP 134/64   Pulse 68   Ht 6' (1.829 m)   Wt 156 lb (70.8 kg)   SpO2 97%   BMI 21.16 kg/m     Wt Readings from Last 3 Encounters:  06/22/20 156 lb (70.8 kg)  05/03/20 156 lb 6.4 oz (70.9 kg)  04/01/20 152 lb 9.6 oz (69.2 kg)     Constitutional:      Appearance: Healthy appearance. Not in distress.  Neck:     Vascular: JVD normal.  Pulmonary:     Effort: Pulmonary  effort is normal.     Breath sounds: No wheezing. No rales.  Cardiovascular:     Normal rate. Regular rhythm. Normal S1. Normal S2.     Murmurs: There is no murmur.  Edema:    Peripheral edema absent.  Abdominal:     Palpations: Abdomen is soft.  Skin:    General: Skin is warm and dry.  Neurological:     General: No focal deficit present.     Mental Status: Alert and oriented to person, place and time.     Cranial Nerves: Cranial nerves are intact.          ASSESSMENT & PLAN:    1. Palpitations 2. Paroxysmal atrial fibrillation (HCC) He has a history of paroxysmal atrial fibrillation.  We took him off of anticoagulation in the past after he presented with a subdural hematoma.  He had a monitor in 2021 that did not demonstrate any recurrent atrial fibrillation.  He had some brief episodes of SVT (6 beats).  At this point, since his palpitations have resolved, I do not think a repeat monitor would be useful.  We discussed possibly obtaining an apple watch or Kardia mobile device.  He will think about this.  TSH with PCP recently was normal.  His blood pressure could tolerate further increases in his beta-blocker dose.  Therefore, I will increase his carvedilol to 9.375 mg twice daily.  He knows to contact us if he has recurrent palpitations.  3. PAD (peripheral artery disease) ([place]) He has bilateral SFA disease.  He has been seen by Dr. Fletcher Anon with [facility] cardiology.  Conservative management has been recommended.  Follow-up as planned.   Continue walking program.  Continue aspirin, statin therapy.  4. Essential hypertension Borderline control.  Increase carvedilol as noted.  5. Pure hypercholesterolemia Continue statin therapy.  Follow-up lipids are scheduled for next month.  Goal LDL <70     Dispo:  Return in about 6 months (around 12/22/2020) for Routine follow up in 6 months with Richardson Dopp, PA. .   Medication Adjustments/Labs and Tests Ordered: Current medicines are reviewed at length with the patient today.  Concerns regarding medicines are outlined above.  Tests Ordered: No orders of the defined types were placed in this encounter.  Medication Changes: Meds ordered this encounter  Medications  . carvedilol (COREG) 6.25 MG tablet    Sig: Take 1.5 tablets (9.375 mg total) by mouth 2 (two) times daily with a meal. TAKE 1 TABLET BY MOUTH TWICE A DAY WITH MEALS    Dispense:  180 tablet    Refill:  3    Signed, Richardson Dopp, PA-C  06/22/2020 5:52 PM    [facility] [place], [place],   [address] Phone: [phone]; Fax: [phone]

## 2020-06-22 NOTE — Patient Instructions (Signed)
Medication Instructions:    INCREASE Coreg one tablet and one half by mouth (9.375 mg) twice daily.    *If you need a refill on your cardiac medications before your next appointment, please call your pharmacy*   Lab Work: None  If you have labs (blood work) drawn today and your tests are completely normal, you will receive your results only by: Marland Kitchen MyChart Message (if you have MyChart) OR . A paper copy in the mail If you have any lab test that is abnormal or we need to change your treatment, we will call you to review the results.   Testing/Procedures: None   Follow-Up: At [facility], you and your health needs are our priority.  As part of our continuing mission to provide you with exceptional heart care, we have created designated Provider Care Teams.  These Care Teams include your primary Cardiologist (physician) and Advanced Practice Providers (APPs -  Physician Assistants and Nurse Practitioners) who all work together to provide you with the care you need, when you need it.  We recommend signing up for the patient portal called "MyChart".  Sign up information is provided on this After Visit Summary.  MyChart is used to connect with patients for Virtual Visits (Telemedicine).  Patients are able to view lab/test results, encounter notes, upcoming appointments, etc.  Non-urgent messages can be sent to your provider as well.   To learn more about what you can do with MyChart, go to [email or web address].    Your next appointment:   6 month(s)  The format for your next appointment:   In Person  Provider:   Richardson Dopp, PA-C   Other Instructions Your physician wants you to follow-up in: 6 months with Richardson Dopp, PA.  You will receive a reminder letter in the mail two months in advance. If you don't receive a letter, please call our office to schedule the follow-up appointment.

## 2020-06-24 ENCOUNTER — Telehealth: Payer: Self-pay | Admitting: *Deleted

## 2020-06-24 NOTE — Telephone Encounter (Signed)
-----   Message from Liliane Shi, [place] sent at 06/22/2020  5:48 PM EDT ----- Can you call his PCPs office and request a copy of his EKG to be faxed to [place]?  It was done in the last couple of weeks. Thank you

## 2020-06-24 NOTE — Telephone Encounter (Signed)
S/w medical records @ [phone] to get pt's recent EKG.  Stated need an order to receive EKG.  Faxed order to [phone] to [facility] to receive EKG.

## 2020-06-27 NOTE — Telephone Encounter (Signed)
Did not receive pt's EKG that was ordered for [place] last week.  Was 11 holding in work [place] will try later.

## 2020-07-27 ENCOUNTER — Other Ambulatory Visit (INDEPENDENT_AMBULATORY_CARE_PROVIDER_SITE_OTHER): Payer: Medicare Other | Admitting: *Deleted

## 2020-07-27 ENCOUNTER — Ambulatory Visit: Payer: Medicare Other | Admitting: Cardiology

## 2020-07-27 ENCOUNTER — Other Ambulatory Visit: Payer: Self-pay

## 2020-07-27 DIAGNOSIS — I739 Peripheral vascular disease, unspecified: Secondary | ICD-10-CM

## 2020-07-27 LAB — HEPATIC FUNCTION PANEL
ALT: 13 IU/L (ref 0–44)
AST: 23 IU/L (ref 0–40)
Albumin: 4.8 g/dL (ref 3.8–4.8)
Alkaline Phosphatase: 97 IU/L (ref 44–121)
Bilirubin Total: 0.7 mg/dL (ref 0.0–1.2)
Bilirubin, Direct: 0.22 mg/dL (ref 0.00–0.40)
Total Protein: 7.4 g/dL (ref 6.0–8.5)

## 2020-07-27 LAB — LIPID PANEL
Chol/HDL Ratio: 2.4 ratio (ref 0.0–5.0)
Cholesterol, Total: 122 mg/dL (ref 100–199)
HDL: 51 mg/dL (ref >39)
LDL Chol Calc (NIH): 60 mg/dL (ref 0–99)
Triglycerides: 49 mg/dL (ref 0–149)
VLDL Cholesterol Cal: 11 mg/dL (ref 5–40)

## 2020-07-28 NOTE — Progress Notes (Signed)
Pt has been made aware of normal result and verbalized understanding.  jw

## 2020-08-09 ENCOUNTER — Telehealth: Payer: Self-pay | Admitting: Cardiovascular Disease

## 2020-08-09 MED ORDER — CARVEDILOL 6.25 MG PO TABS: 9.3750 mg | ORAL_TABLET | Freq: Two times a day (BID) | ORAL | 3 refills | Status: DC

## 2020-08-09 NOTE — Telephone Encounter (Signed)
Called patient and advised him that the prescription for the increased dose was not sent his pharmacy. I advised that the Rx for carvedilol 9.375 mg BID has [facility] to [facility] and should be ready for him later. He verbalized understanding and thanked me for the call.

## 2020-08-09 NOTE — Telephone Encounter (Signed)
*  STAT* If patient is at the pharmacy, call can be transferred to refill team.   1. Which medications need to be refilled? (please list name of each medication and dose if known) carvedilol (COREG) 6.25 MG tablet  2. Which pharmacy/location (including street and city if local pharmacy) is medication to be sent to? [id number] [place], [place] - [address]  3. Do they need a 30 day or 90 day supply? [phone] -Patient stated that the pharmacy told him it wasn't time for a refill. But patient states that he dont believe they got the new prescription that Dr NCathie Oldenchance him too. Please advise

## 2021-01-31 ENCOUNTER — Other Ambulatory Visit: Payer: Self-pay

## 2021-01-31 ENCOUNTER — Ambulatory Visit: Payer: Medicare Other | Admitting: Cardiovascular Disease

## 2021-01-31 ENCOUNTER — Encounter: Payer: Self-pay | Admitting: Cardiovascular Disease

## 2021-01-31 VITALS — BP 190/78 | HR 57 | Ht 72.0 in | Wt 157.8 lb

## 2021-01-31 DIAGNOSIS — E785 Hyperlipidemia, unspecified: Secondary | ICD-10-CM | POA: Diagnosis not present

## 2021-01-31 DIAGNOSIS — I480 Paroxysmal atrial fibrillation: Secondary | ICD-10-CM

## 2021-01-31 DIAGNOSIS — I10 Essential (primary) hypertension: Secondary | ICD-10-CM

## 2021-01-31 DIAGNOSIS — Z720 Tobacco use: Secondary | ICD-10-CM

## 2021-01-31 DIAGNOSIS — I739 Peripheral vascular disease, unspecified: Secondary | ICD-10-CM

## 2021-01-31 NOTE — Progress Notes (Signed)
Cardiology Office Note   Date:  01/31/2021   ID:  Kevin Leon, DOB 02/13/1950, MRN [id number]  PCP:  Vicenta Aly, FNP  Cardiologist:  Dr. Acie Fredrickson  No chief complaint on file.     History of Present Illness: Kevin Leon is a 71 y.o. male who is here today for follow-up visit regarding peripheral arterial disease.   He has known history of paroxysmal atrial fibrillation with prior subdural hematoma currently not on anticoagulation, essential hypertension, rheumatoid arthritis, hepatitis C, tobacco use, hyponatremia and latent TB infection. He is followed for right calf claudication due to a long occlusion of the right SFA with moderate left SFA disease. His symptoms were not lifestyle limiting and I recommended smoking cessation, risk factor modifications and a walking exercise program.  He has been doing well and reports improvement in symptoms overall.  His claudication is now very mild and mostly happens if he goes uphill.  He can walk up to an hour on flat level with no significant limitations.  He is trying to quit smoking and seems to be more determined.  No chest pain or shortness of breath.  Past Medical History:  Diagnosis Date   Anxiety    Aortic insufficiency    Echo 02/2019: EF 60-65, Gr 2 DD, normal wall motion, normal RV SF, mild LAE, trivial MR/TR, mild to moderate AI// Echocardiogram 3/22: EF 60-65, no RWMA, normal RVSF, mild AI, AV sclerosis w/o AS    Arthritis    Essential hypertension 10/04/2015   Hepatitis C    History of stress test    ETT-Echo in 2010 was normal   History of subdural hematoma 08/15/2016   HTN (hypertension)    PAD (peripheral artery disease) (HCC)    ABIs/LE arterial dopplers 4/22: R 0.63; L 1.0 / Long occlusion R SFA thru prox popliteal artery; R PTA occluded / L SFA 50-74    Persistent atrial fibrillation ([place]) 09/09/2015   CHADS2-VASc=2 //  Apixaban //  s/p DCCV 09/14/15 // Monitor 01/2019: NSR, single NSVT (4 beats); single  episode of SVT (6 beats)   Right rotator cuff tear 10/08/2014   Tobacco abuse 10/04/2015   Tuberculosis     Past Surgical History:  Procedure Laterality Date   CARDIOVERSION [name] 09/14/2015   Procedure: CARDIOVERSION;  Surgeon: Thayer Headings, MD;  Location: [facility];  Service: Cardiovascular;  Laterality: [name];   large piece of wood removed from buttocks     as child   SHOULDER ARTHROSCOPY WITH ROTATOR CUFF REPAIR Right 10/08/2014   Procedure: RIGHT SHOULDER ARTHROSCOPY, DEBRIDEMENT, ACROMIOPLASTY WITH ROTATOR CUFF REPAIR;  Surgeon: Marchia Bond, MD;  Location: [facility];  Service: Orthopedics;  Laterality: Right;   TONSILLECTOMY       Current Outpatient Medications  Medication Sig Dispense Refill   aspirin EC 81 MG tablet Take 1 tablet (81 mg total) by mouth daily. Swallow whole. 90 tablet 3   carvedilol (COREG) 6.25 MG tablet Take 1.5 tablets (9.375 mg total) by mouth 2 (two) times daily with a meal. TAKE 1 TABLET BY MOUTH TWICE A DAY WITH MEALS 623 tablet 3   folic acid (FOLVITE) 1 MG tablet Take 1 mg by mouth daily.     lisinopril (ZESTRIL) 10 MG tablet Take 1 tablet daily with the 5 mg tab to equal 15 mg once daily. 90 tablet 3   lisinopril (ZESTRIL) 5 MG tablet Take 1 tablet (5 mg total) by mouth daily. Take 1 tablet daily along  with the 10 mg tab to equal 15 mg once daily. 90 tablet 3   LORazepam (ATIVAN) 0.5 MG tablet Take 0.5 mg by mouth every 6 (six) hours.     Methotrexate 10 MG/0.4ML SOSY Inject into the skin.     Polyethyl Glyc-Propyl Glyc PF (SYSTANE ULTRA PF) 0.4-0.3 % SOLN Place 1 drop into both eyes daily as needed (for dryness/irritation).     rosuvastatin (CRESTOR) 20 MG tablet Take 1 tablet (20 mg total) by mouth daily. 30 tablet 11   No current facility-administered medications for this visit.    Allergies:   Isoniazid and Priftin [rifapentine]    Social History:  The patient  reports that he has been smoking cigarettes. He has been smoking an  average of .5 packs per day. He has never used smokeless tobacco. He reports that he does not currently use alcohol. He reports that he does not use drugs.   Family History:  The patient's family history includes Hypertension in his mother.    ROS:  Please see the history of present illness.   Otherwise, review of systems are positive for none.   All other systems are reviewed and negative.    PHYSICAL EXAM: VS:  BP (!) 190/78    Pulse (!) 57    Ht 6' (1.829 m)    Wt 157 lb 12.8 oz (71.6 kg)    SpO2 97%    BMI 21.40 kg/m  , BMI Body mass index is 21.4 kg/m. GEN: Well nourished, well developed, in no acute distress  HEENT: normal  Neck: no JVD, carotid bruits, or masses Cardiac: RRR; no murmurs, rubs, or gallops,no edema  Respiratory:  clear to auscultation bilaterally, normal work of breathing GI: soft, nontender, nondistended, + BS MS: no deformity or atrophy  Skin: warm and dry, no rash Neuro:  Strength and sensation are intact Psych: euthymic mood, full affect Vascular: Femoral pulse: Normal bilaterally.  Distal pulses are normal on the left side but not palpable on the right.   EKG:  EKG is ordered today. The ekg ordered today demonstrates sinus bradycardia with possible left atrial enlargement.  Recent Labs: 07/27/2020: ALT 13    Lipid Panel    Component Value Date/Time   CHOL 122 07/27/2020 0950   TRIG 49 07/27/2020 0950   HDL 51 07/27/2020 0950   CHOLHDL 2.4 07/27/2020 0950   CHOLHDL 2 04/03/2013 0840   VLDL 3.8 04/03/2013 0840   LDLCALC 60 07/27/2020 0950      Wt Readings from Last 3 Encounters:  01/31/21 157 lb 12.8 oz (71.6 kg)  06/22/20 156 lb (70.8 kg)  05/03/20 156 lb 6.4 oz (70.9 kg)       No flowsheet data found.    ASSESSMENT AND PLAN:  1.  Peripheral arterial disease: He has chronically occluded right SFA with collaterals.  His right calf claudication is now mild and has improved from initial diagnosis.  His symptoms are not lifestyle  limiting.  I recommend continuing medical therapy.   I discussed with him the importance of staying active.  2.  Paroxysmal atrial fibrillation: Not on anticoagulation due to prior subdural hematoma  3.  Tobacco use: I again discussed with him the importance of smoking cessation and he seems to be more determined than before to quit.  He does have nicotine patches.  4.  Hyperlipidemia: He is doing extremely well with rosuvastatin.  Repeat lipid profile showed an LDL of 60.  5.  Essential hypertension: He reports that  his blood pressure is under excellent control at home.  He seems anxious and likely has whitecoat syndrome.  I elected to make no changes in his antihypertensive medications.    Disposition:   FU with me in 12 months  Signed,  Kathlyn [place], MD  01/31/2021 9:04 AM    [place]

## 2021-01-31 NOTE — Patient Instructions (Signed)
Medication Instructions:  The current medical regimen is effective;  continue present plan and medications.  *If you need a refill on your cardiac medications before your next appointment, please call your pharmacy*   Follow-Up: At [facility], you and your health needs are our priority.  As part of our continuing mission to provide you with exceptional heart care, we have created designated Provider Care Teams.  These Care Teams include your primary Cardiologist (physician) and Advanced Practice Providers (APPs -  Physician Assistants and Nurse Practitioners) who all work together to provide you with the care you need, when you need it.  We recommend signing up for the patient portal called "MyChart".  Sign up information is provided on this After Visit Summary.  MyChart is used to connect with patients for Virtual Visits (Telemedicine).  Patients are able to view lab/test results, encounter notes, upcoming appointments, etc.  Non-urgent messages can be sent to your provider as well.   To learn more about what you can do with MyChart, go to [email or web address].    Your next appointment:   12 month(s)  The format for your next appointment:   In Person  Provider:   Dr.Arida

## 2021-04-14 ENCOUNTER — Other Ambulatory Visit: Payer: Self-pay | Admitting: Physician Assistant

## 2021-04-19 ENCOUNTER — Other Ambulatory Visit: Payer: Self-pay | Admitting: Physician Assistant

## 2021-04-20 ENCOUNTER — Other Ambulatory Visit: Payer: Self-pay | Admitting: Physician Assistant

## 2021-05-19 ENCOUNTER — Ambulatory Visit (HOSPITAL_COMMUNITY)
Admission: RE | Admit: 2021-05-19 | Discharge: 2021-05-19 | Disposition: A | Discharge status: Home / Self Care | Payer: Medicare Other | Source: Ambulatory Visit | Attending: Physician Assistant | Admitting: Physician Assistant

## 2021-05-19 ENCOUNTER — Ambulatory Visit (INDEPENDENT_AMBULATORY_CARE_PROVIDER_SITE_OTHER): Payer: Medicare Other

## 2021-05-19 ENCOUNTER — Encounter (HOSPITAL_COMMUNITY): Payer: Self-pay

## 2021-05-19 VITALS — BP 188/74 | HR 69 | Temp 98.7°F | Resp 18 | Ht 72.0 in | Wt 157.8 lb

## 2021-05-19 DIAGNOSIS — J441 Chronic obstructive pulmonary disease with (acute) exacerbation: Secondary | ICD-10-CM

## 2021-05-19 DIAGNOSIS — R059 Cough, unspecified: Secondary | ICD-10-CM | POA: Diagnosis not present

## 2021-05-19 DIAGNOSIS — R051 Acute cough: Secondary | ICD-10-CM

## 2021-05-19 DIAGNOSIS — I10 Essential (primary) hypertension: Secondary | ICD-10-CM

## 2021-05-19 MED ORDER — METHYLPREDNISOLONE SODIUM SUCC 125 MG IJ SOLR
60.0000 mg | Freq: Once | INTRAMUSCULAR | Status: AC
  Administered 2021-05-19: 60 mg via INTRAMUSCULAR

## 2021-05-19 MED ORDER — PREDNISONE 20 MG PO TABS: 20.0000 mg | ORAL_TABLET | Freq: Every day | ORAL | 0 refills | Status: AC

## 2021-05-19 MED ORDER — ALBUTEROL SULFATE HFA 108 (90 BASE) MCG/ACT IN AERS
2.0000 | INHALATION_SPRAY | Freq: Once | RESPIRATORY_TRACT | Status: AC
  Administered 2021-05-19: 2 via RESPIRATORY_TRACT

## 2021-05-19 MED ORDER — DOXYCYCLINE HYCLATE 100 MG PO CAPS: 100.0000 mg | ORAL_CAPSULE | Freq: Two times a day (BID) | ORAL | 0 refills | Status: DC

## 2021-05-19 MED ORDER — METHYLPREDNISOLONE SODIUM SUCC 125 MG IJ SOLR
INTRAMUSCULAR | Status: AC
Start: 2021-05-19 — End: ?
  Filled 2021-05-19: qty 2

## 2021-05-19 MED ORDER — ALBUTEROL SULFATE HFA 108 (90 BASE) MCG/ACT IN AERS
INHALATION_SPRAY | RESPIRATORY_TRACT | Status: AC
  Filled 2021-05-19: qty 6.7

## 2021-05-19 NOTE — ED Triage Notes (Signed)
Pt reports cough and nasal congestion since Saturday. States the symptoms started as a sore throat, which resolved.  ?

## 2021-05-19 NOTE — Discharge Instructions (Signed)
Your checks x-ray showed some scarring and broken ribs as we discussed but did not show any evidence of pneumonia.  I believe that you have the beginning of COPD.  We gave you an injection of steroids today so please start prednisone 20 mg for the next 3 days starting tomorrow (05/20/2021).  Do not take NSAIDs including aspirin beyond your baby aspirin, ibuprofen/Advil/Motrin, naproxen/Aleve.  You can use Tylenol as needed.  We are starting you on doxycycline.  Please stay out of the sun while on this medication as it can cause you to have a sunburn.  You can use plain Mucinex as we discussed to help with your symptoms as well as Flonase over-the-counter.  Please follow-up with your primary care next week to ensure symptom improvement.  If anything worsens and you have high fever, worsening cough, shortness of breath, nausea/vomiting, weakness you need to go to the emergency room immediately. ?

## 2021-05-19 NOTE — ED Provider Notes (Signed)
?[facility] ? ? ? ?CSN: [id number] ?Arrival date & time: 05/19/21  4132 ? ? ?  ? ?History   ?Chief Complaint ?Chief Complaint  ?Patient presents with  ? Cough  ?  COUGH - Entered by patient  ? 0900 Appt   ? Nasal Congestion  ? ? ?HPI ?Kevin Leon is a 71 y.o. male.  ? ?Patient presents today with a weeklong history of URI symptoms including nasal congestion and cough.  Reports initially had a sore throat and feeling poorly but the symptoms have resolved but he continues to have significant cough and congestion.  He did take an at-home COVID test when symptoms began that was negative.  He has not had COVID in the past but is up-to-date on COVID vaccines.  He has not tried any over-the-counter medication for symptom management.  He is a current everyday smoker with a lifelong history of smoking denies formal diagnosis of COPD or asthma.  He is not using any inhalers for symptom management.  He reports cough is productive with associated shortness of breath.  Denies any fever, weakness, nausea, vomiting.  He does not have a history of diabetes.  He denies any recent antibiotic or steroid use.  He does have a history of rheumatoid arthritis and is currently on immune suppressing medication (methotrexate). ? ?Patient's blood pressure was noted to be elevated today.  He has not taken his blood pressure medication yet.  Denies any chest pain, increased shortness of breath, headache, dizziness, vision change.  He reports generally having whitecoat syndrome but monitor his blood pressure closely at home.  Denies any decongestant use, increased sodium/caffeine consumption. ? ? ?Past Medical History:  ?Diagnosis Date  ? Anxiety   ? Aortic insufficiency   ? Echo 02/2019: EF 60-65, Gr 2 DD, normal wall motion, normal RV SF, mild LAE, trivial MR/TR, mild to moderate AI// Echocardiogram 3/22: EF 60-65, no RWMA, normal RVSF, mild AI, AV sclerosis w/o AS   ? Arthritis   ? Essential hypertension 10/04/2015  ? Hepatitis C    ? History of stress test   ? ETT-Echo in 2010 was normal  ? History of subdural hematoma 08/15/2016  ? HTN (hypertension)   ? PAD (peripheral artery disease) ([place])   ? ABIs/LE arterial dopplers 4/22: R 0.63; L 1.0 / Long occlusion R SFA thru prox popliteal artery; R PTA occluded / L SFA 50-74   ? Persistent atrial fibrillation ([place]) 09/09/2015  ? CHADS2-VASc=2 //  Apixaban //  s/p DCCV 09/14/15 // Monitor 01/2019: NSR, single NSVT (4 beats); single episode of SVT (6 beats)  ? Right rotator cuff tear 10/08/2014  ? Tobacco abuse 10/04/2015  ? Tuberculosis   ? ? ?Patient Active Problem List  ? Diagnosis Date Noted  ? Pure hypercholesterolemia 06/22/2020  ? PAD (peripheral artery disease) ([place])   ? Acute respiratory failure ([place]) 08/14/2019  ? Loculated pleural effusion 08/14/2019  ? Rheumatoid arthritis ([place]) 08/14/2019  ? Acute respiratory failure with hypoxia ([place]) 08/14/2019  ? Pleural effusion   ? Aortic insufficiency   ? Acute superficial gastritis without hemorrhage 04/04/2017  ? Diverticulosis 03/21/2017  ? Alcohol consumption heavy 09/18/2016  ? History of subdural hematoma 08/15/2016  ? Essential hypertension 10/04/2015  ? Tobacco abuse 10/04/2015  ? Persistent atrial fibrillation ([place]) 09/09/2015  ? Right rotator cuff tear 10/08/2014  ? Quit smoking within past year 04/02/2014  ? GAD (generalized anxiety disorder) 03/31/2013  ? Hepatitis C 03/31/2013  ? Tinea cruris 03/31/2013  ? ? ?  Past Surgical History:  ?Procedure Laterality Date  ? CARDIOVERSION [name] 09/14/2015  ? Procedure: CARDIOVERSION;  Surgeon: Thayer Headings, MD;  Location: [facility];  Service: Cardiovascular;  Laterality: [name];  ? large piece of wood removed from buttocks    ? as child  ? SHOULDER ARTHROSCOPY WITH ROTATOR CUFF REPAIR Right 10/08/2014  ? Procedure: RIGHT SHOULDER ARTHROSCOPY, DEBRIDEMENT, ACROMIOPLASTY WITH ROTATOR CUFF REPAIR;  Surgeon: Marchia Bond, MD;  Location: [place];  Service: Orthopedics;  Laterality: Right;   ? TONSILLECTOMY    ? ? ? ? ? ?Home Medications   ? ?Prior to Admission medications   ?Medication Sig Start Date End Date Taking? Authorizing Provider  ?doxycycline (VIBRAMYCIN) 100 MG capsule Take 1 capsule (100 mg total) by mouth 2 (two) times daily. 05/19/21  Yes Cordarrel Stiefel K, PA-C  ?predniSONE (DELTASONE) 20 MG tablet Take 1 tablet (20 mg total) by mouth daily for 3 days. 05/19/21 05/22/21 Yes Pau Banh, Derry Skill, PA-C  ?aspirin EC 81 MG tablet Take 1 tablet (81 mg total) by mouth daily. Swallow whole. 04/19/20   Richardson Dopp T, PA-C  ?carvedilol (COREG) 6.25 MG tablet Take 1.5 tablets (9.375 mg total) by mouth 2 (two) times daily with a meal. TAKE 1 TABLET BY MOUTH TWICE A DAY WITH MEALS 08/09/20   Richardson Dopp T, PA-C  ?folic acid (FOLVITE) 1 MG tablet Take 1 mg by mouth daily. 03/02/20   [provider]  ?lisinopril (ZESTRIL) 10 MG tablet TAKE 1 TABLET DAILY WITH THE 5 MG TAB TO EQUAL 15 MG ONCE DAILY. 04/19/21   Richardson Dopp T, PA-C  ?lisinopril (ZESTRIL) 5 MG tablet Take 1 tablet (5 mg total) by mouth daily. 04/20/21   Nahser, Wonda Cheng, MD  ?LORazepam (ATIVAN) 0.5 MG tablet Take 0.5 mg by mouth every 6 (six) hours. 04/21/11   [provider]  ?Methotrexate 10 MG/0.4ML SOSY Inject into the skin.    [provider]  ?Polyethyl Glyc-Propyl Glyc PF (SYSTANE ULTRA PF) 0.4-0.3 % SOLN Place 1 drop into both eyes daily as needed (for dryness/irritation).    [provider]  ?rosuvastatin (CRESTOR) 20 MG tablet TAKE 1 TABLET BY MOUTH EVERY DAY 04/14/21   Nahser, Wonda Cheng, MD  ? ? ?Family History ?Family History  ?Problem Relation Age of Onset  ? Hypertension Mother   ? Heart attack Neg Hx   ? ? ?Social History ?Social History  ? ?Tobacco Use  ? Smoking status: Every Day  ?  Packs/day: 0.50  ?  Types: Cigarettes  ?  Last attempt to quit: 02/15/2014  ?  Years since quitting: 7.2  ? Smokeless tobacco: Never  ? Tobacco comments:  ?  smokes 10 per day, trying to quit.  ?Vaping Use  ? Vaping Use: Never  used  ?Substance Use Topics  ? Alcohol use: Not Currently  ?  Alcohol/week: 0.0 standard drinks  ? Drug use: No  ?  Types: Heroin  ?  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 ? ? ?Allergies   ?Isoniazid and Priftin [rifapentine] ? ? ?Review of Systems ?Review of Systems  ?Constitutional:  Positive for activity change. Negative for appetite change, fatigue and fever.  ?HENT:  Positive for congestion. Negative for sinus pressure, sneezing and sore throat (Improved).   ?Respiratory:  Positive for cough, shortness of breath and wheezing. Negative for chest tightness.   ?Cardiovascular:  Negative for chest pain.  ?Gastrointestinal:  Negative for abdominal pain, diarrhea, nausea and vomiting.  ?  Neurological:  Negative for dizziness, light-headedness and headaches.  ? ? ?Physical Exam ?Triage Vital Signs ?ED Triage Vitals  ?Enc Vitals Group  ?   BP 05/19/21 0905 (!) 188/74  ?   Pulse Rate 05/19/21 0905 69  ?   Resp 05/19/21 0905 18  ?   Temp 05/19/21 0905 98.7 ?F (37.1 ?C)  ?   Temp Source 05/19/21 0905 Oral  ?   SpO2 05/19/21 0905 98 %  ?   Weight 05/19/21 0906 157 lb 13.6 oz (71.6 kg)  ?   Height 05/19/21 0906 6' (1.829 m)  ?   Head Circumference --   ?   Peak Flow --   ?   Pain Score 05/19/21 0906 0  ?   Pain Loc --   ?   Pain Edu? --   ?   Excl. in [place]? --   ? ?No data found. ? ?Updated Vital Signs ?BP (!) 188/74 (BP Location: Left Arm) Comment: states has HTN and has not taken meds today  Pulse 69   Temp 98.7 ?F (37.1 ?C) (Oral)   Resp 18   Ht 6' (1.829 m)   Wt 157 lb 13.6 oz (71.6 kg)   SpO2 98%   BMI 21.41 kg/m?  ? ?Visual Acuity ?Right Eye Distance:   ?Left Eye Distance:   ?Bilateral Distance:   ? ?Right Eye Near:   ?Left Eye Near:    ?Bilateral Near:    ? ?Physical Exam ?Vitals reviewed.  ?Constitutional:   ?   General: He is awake.  ?   Appearance: Normal appearance. He is well-developed. He is not ill-appearing.  ?   Comments: Very pleasant male appears in today to no acute distress sitting  comfortably in exam room  ?HENT:  ?   Head: Normocephalic and atraumatic.  ?   Right Ear: Tympanic membrane, ear canal and external ear normal. Tympanic membrane is not erythematous or bulging.  ?   Left Ear: Tympanic

## 2021-05-23 NOTE — Progress Notes (Signed)
?Cardiology Office Note:   ? ?Date:  05/24/2021  ? ?ID:  Kevin Leon, DOB 01-19-1950, MRN [id number] ? ?PCP:  Vicenta Aly, [place]  ?[facility] Providers ?Cardiologist:  Mertie Moores, MD ?Cardiology APP:  Liliane Shi, PA-C    ?Referring MD: Vicenta Aly, [place]  ? ?Chief Complaint:  Follow-up for atrial fibrillation, HTN, PAD ?  ? ?Patient Profile: ?Paroxysmal atrial fibrillation ?Prior subdural hematoma >> no longer on anticoagulation ?Aortic insufficiency  ?Echocardiogram 02/2019: EF 60-65, Gr 2 DD mild to mod AI ?Peripheral arterial disease  ?Bilateral SFA disease - med Rx  ?Hypertension ?Hepatitis C ?Tobacco abuse ?Hyponatremia ?Anxiety   ?Rheumatoid Arthritis ?Latent TB Infection  ?Palpitations ?Monitor 02/2019: NSR, 4 bt NSVT, 6 bt SVT ?R pleural effusion (loculated) 07/2019 2/2 chest trauma (horse kick) ?S/p chest tube ? ?Prior CV Studies:  ?LE arterial US 04/2020 ?Summary:  ?Right: Total occlusion noted in the superficial femoral artery and/or  ?popliteal artery. Total occlusion noted in the posterior tibial artery.  ?Long right SFA through proximal popliteal artery occlusion, with  ?reconstitution in the mid popliteal artery.  ?Occluded proximal to mid right PTA.  ? ?Left: 50-74% stenosis noted in the superficial femoral artery. Focal  ?distal left SFA stenosis.  ?  ?Echocardiogram 03/22/20 ?EF 60-65, no RWMA, normal RVSF, mild AI, AV sclerosis w/o AS  ?  ?Echocardiogram 02/18/2019 ?EF 60-65, Gr 2 DD, no RWMA, mild LAE, trivial MR, trivial Tr, mild to mod AI ?  ?3-14 day monitor 01/2019 ?Normal sinus rhythm, 4 beat NSVT, single episode of SVT (6 beats) ?  ?Echo 08/30/15 ?EF 60-65%, normal wall motion, mild MR ? ?History of Present Illness:   ?Kevin Leon is a 71 y.o. male with the above problem list.  He was last seen in June 2022.  He had follow-up with Dr. Fletcher Anon in January 2023.  Continued conservative management was recommended for his PAD.  He was recently seen at urgent care for respiratory  tract infection.  He returns for follow-up.  He is here alone.  He is still recovering from his respiratory tract infection.  Prior to getting sick, he was not having any issues with chest discomfort or significant shortness of breath.  He has not had syncope or significant leg edema.  He continues to note claudication without any significant change.     ?   ?Past Medical History:  ?Diagnosis Date  ? Anxiety   ? Aortic insufficiency   ? Echo 02/2019: EF 60-65, Gr 2 DD, normal wall motion, normal RV SF, mild LAE, trivial MR/TR, mild to moderate AI// Echocardiogram 3/22: EF 60-65, no RWMA, normal RVSF, mild AI, AV sclerosis w/o AS   ? Arthritis   ? Essential hypertension 10/04/2015  ? Hepatitis C   ? History of stress test   ? ETT-Echo in 2010 was normal  ? History of subdural hematoma 08/15/2016  ? HTN (hypertension)   ? PAD (peripheral artery disease) ([place])   ? ABIs/LE arterial dopplers 4/22: R 0.63; L 1.0 / Long occlusion R SFA thru prox popliteal artery; R PTA occluded / L SFA 50-74   ? Persistent atrial fibrillation ([place]) 09/09/2015  ? CHADS2-VASc=2 //  Apixaban //  s/p DCCV 09/14/15 // Monitor 01/2019: NSR, single NSVT (4 beats); single episode of SVT (6 beats)  ? Right rotator cuff tear 10/08/2014  ? Tobacco abuse 10/04/2015  ? Tuberculosis   ? ?Current Medications: ?Current Meds  ?Medication Sig  ? aspirin EC 81 MG tablet Take  1 tablet (81 mg total) by mouth daily. Swallow whole.  ? carvedilol (COREG) 6.25 MG tablet Take 1.5 by mouth two times daily  ? doxycycline (VIBRAMYCIN) 100 MG capsule Take 1 capsule (100 mg total) by mouth 2 (two) times daily.  ? folic acid (FOLVITE) 1 MG tablet Take 1 mg by mouth daily.  ? lisinopril (ZESTRIL) 10 MG tablet TAKE 1 TABLET DAILY WITH THE 5 MG TAB TO EQUAL 15 MG ONCE DAILY.  ? lisinopril (ZESTRIL) 5 MG tablet Take 1 tablet (5 mg total) by mouth daily.  ? LORazepam (ATIVAN) 0.5 MG tablet Take 0.5 mg by mouth every 6 (six) hours.  ? Methotrexate 10 MG/0.4ML SOSY Inject 0.5 mg into  the skin once a week.  ? Polyethyl Glyc-Propyl Glyc PF (SYSTANE ULTRA PF) 0.4-0.3 % SOLN Place 1 drop into both eyes daily as needed (for dryness/irritation).  ? rosuvastatin (CRESTOR) 20 MG tablet TAKE 1 TABLET BY MOUTH EVERY DAY  ?  ?Allergies:   Isoniazid and Priftin [rifapentine]  ? ?Social History  ? ?Tobacco Use  ? Smoking status: Every Day  ?  Packs/day: 0.50  ?  Types: Cigarettes  ?  Last attempt to quit: 02/15/2014  ?  Years since quitting: 7.2  ? Smokeless tobacco: Never  ? Tobacco comments:  ?  smokes 10 per day, trying to quit.  ?Vaping Use  ? Vaping Use: Never used  ?Substance Use Topics  ? Alcohol use: Not Currently  ?  Alcohol/week: 0.0 standard drinks  ? Drug use: No  ?  Types: Heroin  ?  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  ?Family Hx: ?The patient's family history includes Hypertension in his mother. There is no history of Heart attack. ? ?Review of Systems  ?Cardiovascular:  Positive for claudication.   ? ?EKGs/Labs/Other Test Reviewed:   ? ?EKG:  EKG is not ordered today.  The ekg ordered today demonstrates n/a ? ?Recent Labs: ?07/27/2020: ALT 13  ? ?Recent Lipid Panel ?Recent Labs  ?  07/27/20 ?0950  ?CHOL 122  ?TRIG 49  ?HDL 51  ?[place] 60  ?  ?Labs in [place] personally reviewed and interpreted: ?01/05/2021-total cholesterol 129, HDL 62, LDL 56, triglycerides 50 ?06/09/2020-TSH 0.941, creatinine 0.94, K+ 4.7, ALT 18, Hgb 15 ? ?Labs obtained through [facility] - personally reviewed and interpreted: ?04/10/2021: Hgb 13.9, creatinine 1.04, K+ 4.1, ALT 13 ? ?Risk Assessment/Calculations:   ? ?CHA2DS2-VASc Score = 3  ? This indicates a 3.2% annual risk of stroke. ?The patient's score is based upon: ?CHF History: 0 ?HTN History: 1 ?Diabetes History: 0 ?Stroke History: 0 ?Vascular Disease History: 1 ?Age Score: 1 ?Gender Score: 0 ?  ? ?    ?Physical Exam:   ? ?VS:  BP 128/62   Pulse 82   Ht 6' (1.829 m)   Wt 159 lb 9.6 oz (72.4 kg)   SpO2 97%   BMI 21.65 kg/m?    ? ?Wt Readings  from Last 3 Encounters:  ?05/24/21 159 lb 9.6 oz (72.4 kg)  ?05/19/21 157 lb 13.6 oz (71.6 kg)  ?01/31/21 157 lb 12.8 oz (71.6 kg)  ?  ?Constitutional:   ?   Appearance: Healthy appearance. Not in distress.  ?Neck:  ?   Vascular: No carotid bruit or JVR. JVD normal.  ?Pulmonary:  ?   Effort: Pulmonary effort is normal.  ?   Breath sounds: No wheezing. No rales.  ?Cardiovascular:  ?   Normal rate. Regular rhythm.  Normal S1. Normal S2.   ?   Murmurs: There is no murmur.  ?Edema: ?   Peripheral edema absent.  ?Abdominal:  ?   Palpations: Abdomen is soft.  ?Skin: ?   General: Skin is warm and dry.  ?Neurological:  ?   General: No focal deficit present.  ?   Mental Status: Alert and oriented to person, place and time.  ?   Cranial Nerves: Cranial nerves are intact.  ?  ?    ?ASSESSMENT & PLAN:   ?Aortic insufficiency ?Mild aortic insufficiency by echocardiogram in 2022.  I do not hear an obvious murmur on exam.  He has no symptoms to suggest worsening.  Consider follow-up echocardiogram in the next 2 to 3 years. ? ?Essential hypertension ?Blood pressure is well controlled.  Continue carvedilol 9.375 mg twice daily, lisinopril 15 mg daily.  Recent creatinine stable. ? ?PAD (peripheral artery disease) ([place]) ?He has stable intermittent claudication.  He is followed by Dr. Fletcher Anon and is managed conservatively.  He has not had any worsening symptoms.  I have recommended quitting smoking.  I have him urged him to continue a walking program. ? ?PAF (paroxysmal atrial fibrillation) ([place]) ?Prior history of subdural bleed.  Anticoagulation was stopped at that time.  He has not had any known recurrence of atrial fibrillation. ? ?Pure hypercholesterolemia ?Recent LDL optimal.  Continue Crestor 20 mg daily. ? ?Tobacco abuse ?I have recommended cessation. ? ? ?     ?   ?Dispo:  Return in about 1 year (around 05/25/2022) for Routine Follow Up, w/ Richardson Dopp, PA-C.  ? ?Medication Adjustments/Labs and Tests Ordered: ?Current medicines  are reviewed at length with the patient today.  Concerns regarding medicines are outlined above.  ?Tests Ordered: ?No orders of the defined types were placed in this encounter. ? ?Medication Changes: ?No orders of

## 2021-05-24 ENCOUNTER — Encounter: Payer: Self-pay | Admitting: Physician Assistant

## 2021-05-24 ENCOUNTER — Ambulatory Visit: Payer: Medicare Other | Admitting: Physician Assistant

## 2021-05-24 VITALS — BP 128/62 | HR 82 | Ht 72.0 in | Wt 159.6 lb

## 2021-05-24 DIAGNOSIS — I351 Nonrheumatic aortic (valve) insufficiency: Secondary | ICD-10-CM | POA: Diagnosis not present

## 2021-05-24 DIAGNOSIS — I480 Paroxysmal atrial fibrillation: Secondary | ICD-10-CM

## 2021-05-24 DIAGNOSIS — E7800 Pure hypercholesterolemia, unspecified: Secondary | ICD-10-CM

## 2021-05-24 DIAGNOSIS — I739 Peripheral vascular disease, unspecified: Secondary | ICD-10-CM

## 2021-05-24 DIAGNOSIS — I10 Essential (primary) hypertension: Secondary | ICD-10-CM

## 2021-05-24 DIAGNOSIS — Z720 Tobacco use: Secondary | ICD-10-CM

## 2021-05-24 NOTE — Patient Instructions (Signed)
Medication Instructions:  Your physician recommends that you continue on your current medications as directed. Please refer to the Current Medication list given to you today.  *If you need a refill on your cardiac medications before your next appointment, please call your pharmacy*   Lab Work: None ordered  If you have labs (blood work) drawn today and your tests are completely normal, you will receive your results only by: MyChart Message (if you have MyChart) OR A paper copy in the mail If you have any lab test that is abnormal or we need to change your treatment, we will call you to review the results.   Testing/Procedures: None ordered   Follow-Up: At [facility], you and your health needs are our priority.  As part of our continuing mission to provide you with exceptional heart care, we have created designated Provider Care Teams.  These Care Teams include your primary Cardiologist (physician) and Advanced Practice Providers (APPs -  Physician Assistants and Nurse Practitioners) who all work together to provide you with the care you need, when you need it.  We recommend signing up for the patient portal called "MyChart".  Sign up information is provided on this After Visit Summary.  MyChart is used to connect with patients for Virtual Visits (Telemedicine).  Patients are able to view lab/test results, encounter notes, upcoming appointments, etc.  Non-urgent messages can be sent to your provider as well.   To learn more about what you can do with MyChart, go to [email or web address].    Your next appointment:   1 year(s)  The format for your next appointment:   In Person  Provider:   Scott Weaver, PA-C         Other Instructions   Important Information About Sugar       

## 2021-05-24 NOTE — Assessment & Plan Note (Addendum)
He has stable intermittent claudication.  He is followed by Dr. Fletcher Anon and is managed conservatively.  He has not had any worsening symptoms.  I have recommended quitting smoking.  I have him urged him to continue a walking program. ?

## 2021-05-24 NOTE — Assessment & Plan Note (Signed)
Mild aortic insufficiency by echocardiogram in 2022.  I do not hear an obvious murmur on exam.  He has no symptoms to suggest worsening.  Consider follow-up echocardiogram in the next 2 to 3 years. ?

## 2021-05-24 NOTE — Assessment & Plan Note (Signed)
Blood pressure is well controlled.  Continue carvedilol 9.375 mg twice daily, lisinopril 15 mg daily.  Recent creatinine stable. ?

## 2021-05-24 NOTE — Assessment & Plan Note (Signed)
Recent LDL optimal.  Continue Crestor 20 mg daily. ?

## 2021-05-24 NOTE — Assessment & Plan Note (Signed)
Prior history of subdural bleed.  Anticoagulation was stopped at that time.  He has not had any known recurrence of atrial fibrillation. ?

## 2021-05-24 NOTE — Assessment & Plan Note (Addendum)
I have recommended cessation. 

## 2021-07-07 ENCOUNTER — Other Ambulatory Visit: Payer: Self-pay | Admitting: Cardiovascular Disease

## 2021-07-11 ENCOUNTER — Other Ambulatory Visit: Payer: Self-pay | Admitting: Physician Assistant

## 2021-07-11 MED ORDER — LISINOPRIL 5 MG PO TABS: 5.0000 mg | ORAL_TABLET | Freq: Every day | ORAL | 3 refills | Status: DC

## 2021-08-07 ENCOUNTER — Other Ambulatory Visit: Payer: Self-pay | Admitting: Physician Assistant

## 2021-08-07 MED ORDER — CARVEDILOL 6.25 MG PO TABS: ORAL_TABLET | ORAL | 3 refills | Status: DC

## 2021-10-24 ENCOUNTER — Encounter (HOSPITAL_COMMUNITY): Payer: Self-pay

## 2021-10-24 ENCOUNTER — Ambulatory Visit (HOSPITAL_COMMUNITY)
Admission: RE | Admit: 2021-10-24 | Discharge: 2021-10-24 | Disposition: A | Discharge status: Home / Self Care | Payer: Medicare Other | Source: Ambulatory Visit | Attending: Family Medicine | Admitting: Family Medicine

## 2021-10-24 ENCOUNTER — Ambulatory Visit (INDEPENDENT_AMBULATORY_CARE_PROVIDER_SITE_OTHER): Payer: Medicare Other

## 2021-10-24 VITALS — BP 183/57 | HR 62 | Temp 97.7°F

## 2021-10-24 DIAGNOSIS — R0789 Other chest pain: Secondary | ICD-10-CM | POA: Insufficient documentation

## 2021-10-24 DIAGNOSIS — R079 Chest pain, unspecified: Secondary | ICD-10-CM

## 2021-10-24 DIAGNOSIS — Z1152 Encounter for screening for COVID-19: Secondary | ICD-10-CM | POA: Insufficient documentation

## 2021-10-24 NOTE — ED Provider Notes (Signed)
[place]    CSN: [id number] Arrival date & time: 10/24/21  1478      History   Chief Complaint Chief Complaint  Patient presents with   Chest Pain    HPI Kevin Leon is a 71 y.o. male.   HPI Here for a 2 to 3-day history of central chest tightness.  He experiences this with deep breaths, or with certain movements.  He does not experience this discomfort/tightness when he is walking across the room or doing any other activity.  For about 2 or 3 days, he is also had a mild cough and some nasal congestion, the nasal congestion is mainly in the mornings.  No fever or chills and no nausea or vomiting.  He is a smoker; no wheezing noted recently.  He notes that he has peripheral artery disease.  He has had A-fib in the past, status post cardioversion  Past Medical History:  Diagnosis Date   Anxiety    Aortic insufficiency    Echo 02/2019: EF 60-65, Gr 2 DD, normal wall motion, normal RV SF, mild LAE, trivial MR/TR, mild to moderate AI// Echocardiogram 3/22: EF 60-65, no RWMA, normal RVSF, mild AI, AV sclerosis w/o AS    Arthritis    Essential hypertension 10/04/2015   Hepatitis C    History of stress test    ETT-Echo in 2010 was normal   History of subdural hematoma 08/15/2016   HTN (hypertension)    PAD (peripheral artery disease) ([facility])    ABIs/LE arterial dopplers 4/22: R 0.63; L 1.0 / Long occlusion R SFA thru prox popliteal artery; R PTA occluded / L SFA 50-74    Persistent atrial fibrillation ([place]) 09/09/2015   CHADS2-VASc=2 //  Apixaban //  s/p DCCV 09/14/15 // Monitor 01/2019: NSR, single NSVT (4 beats); single episode of SVT (6 beats)   Right rotator cuff tear 10/08/2014   Tobacco abuse 10/04/2015   Tuberculosis     Patient Active Problem List   Diagnosis Date Noted   Pure hypercholesterolemia 06/22/2020   PAD (peripheral artery disease) ([facility])    Acute respiratory failure ([place]) 08/14/2019   Loculated pleural effusion 08/14/2019   Rheumatoid  arthritis ([place]) 08/14/2019   Acute respiratory failure with hypoxia ([facility]) 08/14/2019   Pleural effusion    Aortic insufficiency    Acute superficial gastritis without hemorrhage 04/04/2017   Diverticulosis 03/21/2017   Alcohol consumption heavy 09/18/2016   History of subdural hematoma 08/15/2016   Essential hypertension 10/04/2015   Tobacco abuse 10/04/2015   PAF (paroxysmal atrial fibrillation) ([place]) 09/09/2015   Right rotator cuff tear 10/08/2014   Quit smoking within past year 04/02/2014   GAD (generalized anxiety disorder) 03/31/2013   Hepatitis C 03/31/2013   Tinea cruris 03/31/2013    Past Surgical History:  Procedure Laterality Date   CARDIOVERSION [name] 09/14/2015   Procedure: CARDIOVERSION;  Surgeon: Thayer Headings, MD;  Location: [facility];  Service: Cardiovascular;  Laterality: [name];   large piece of wood removed from buttocks     as child   SHOULDER ARTHROSCOPY WITH ROTATOR CUFF REPAIR Right 10/08/2014   Procedure: RIGHT SHOULDER ARTHROSCOPY, DEBRIDEMENT, ACROMIOPLASTY WITH ROTATOR CUFF REPAIR;  Surgeon: Marchia Bond, MD;  Location: [facility];  Service: Orthopedics;  Laterality: Right;   TONSILLECTOMY         Home Medications    Prior to Admission medications   Medication Sig Start Date End Date Taking? Authorizing Provider  aspirin EC 81 MG tablet Take 1  tablet (81 mg total) by mouth daily. Swallow whole. 04/19/20   Richardson Dopp T, PA-C  carvedilol (COREG) 6.25 MG tablet Take 1.5 by mouth two times daily 08/07/21   Nahser, Wonda Cheng, MD  doxycycline (VIBRAMYCIN) 100 MG capsule Take 1 capsule (100 mg total) by mouth 2 (two) times daily. 05/19/21   Raspet, Derry Skill, PA-C  folic acid (FOLVITE) 1 MG tablet Take 1 mg by mouth daily. 03/02/20   [provider]  lisinopril (ZESTRIL) 10 MG tablet TAKE 1 TABLET DAILY WITH THE 5 MG TAB TO EQUAL 15 MG ONCE DAILY. 07/11/21   Nahser, Wonda Cheng, MD  lisinopril (ZESTRIL) 5 MG tablet Take 1 tablet (5 mg total) by  mouth daily. Take with the 10 mg table to equal 15 mg daily. 07/11/21   Nahser, Wonda Cheng, MD  LORazepam (ATIVAN) 0.5 MG tablet Take 0.5 mg by mouth every 6 (six) hours. 04/21/11   [provider]  Methotrexate 10 MG/0.4ML SOSY Inject 0.5 mg into the skin once a week.    [provider]  Polyethyl Glyc-Propyl Glyc PF (SYSTANE ULTRA PF) 0.4-0.3 % SOLN Place 1 drop into both eyes daily as needed (for dryness/irritation).    [provider]  rosuvastatin (CRESTOR) 20 MG tablet TAKE 1 TABLET BY MOUTH EVERY DAY 07/07/21   Nahser, Wonda Cheng, MD    Family History Family History  Problem Relation Age of Onset   Hypertension Mother    Heart attack Neg Hx     Social History Social History   Tobacco Use   Smoking status: Every Day    Packs/day: 0.50    Types: Cigarettes    Last attempt to quit: 02/15/2014    Years since quitting: 7.6   Smokeless tobacco: Never   Tobacco comments:    smokes 10 per day, trying to quit.  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 --      Excl. in [place]? --    No data found.  Updated Vital Signs BP (!) 183/57 (BP Location: Right Arm)   Pulse 62   Temp 97.7 F (36.5 C) (Oral)   SpO2 100%   Visual Acuity Right Eye Distance:   Left Eye Distance:   Bilateral Distance:     Right Eye Near:   Left Eye Near:    Bilateral Near:     Physical Exam   UC Treatments / Results  Labs (all labs ordered are  listed, but only abnormal results are displayed) Labs Reviewed - No data to display  EKG   Radiology No results found.  Procedures Procedures (including critical care time)  Medications Ordered in UC Medications - No data to display  Initial Impression / Assessment and Plan / UC Course  I have reviewed the triage vital signs and the nursing notes.  Pertinent labs & imaging results that were available during my care of the patient were reviewed by me and considered in my medical decision making (see chart for details).        EKG is benign and unchanged from previous EKGs in epic.  Chest x-ray does not show any fluid or infiltrate, but it does show some emphysematous changes.  Discussed potential reasons for his symptoms, including muscular chest wall pain or pleurisy.  Also consider COPD, though that would not usually be exacerbated by certain motions.  He is going to take Tylenol as needed and consider trying his inhaler when he is having these chest tightness symptoms.  COVID swab done today; and if he is positive he is a candidate for Paxlovid.  His last EGFR was over 60 Final Clinical Impressions(s) / UC Diagnoses   Final diagnoses:  None   Discharge Instructions   None    ED Prescriptions   None    PDMP not reviewed this encounter.   Barrett Henle, MD 10/24/21 1028

## 2021-10-24 NOTE — ED Triage Notes (Signed)
Pt had intermittent chest tightness in central chest when takes a deep breath for 2-3 days. Denies pain

## 2021-10-24 NOTE — Discharge Instructions (Addendum)
Your EKG was unchanged from previous EKGs  Your chest x-ray did not show any fluid or pneumonia.  Also no masses  Try using the inhaler you already have at home as needed  You can also take Tylenol as needed for any discomfort   You have been swabbed for COVID, and the test will result in the next 24 hours. Our staff will call you if positive. If the test is positive, you should quarantine for 5 days from the start of your symptoms

## 2021-10-25 LAB — SARS CORONAVIRUS 2 (TAT 6-24 HRS): SARS Coronavirus 2: NEGATIVE

## 2022-05-15 ENCOUNTER — Ambulatory Visit: Payer: Medicare Other | Attending: Cardiovascular Disease | Admitting: Cardiovascular Disease

## 2022-05-15 ENCOUNTER — Encounter: Payer: Self-pay | Admitting: Cardiovascular Disease

## 2022-05-15 VITALS — BP 170/70 | HR 55 | Ht 72.0 in | Wt 159.0 lb

## 2022-05-15 DIAGNOSIS — I10 Essential (primary) hypertension: Secondary | ICD-10-CM

## 2022-05-15 DIAGNOSIS — Z720 Tobacco use: Secondary | ICD-10-CM

## 2022-05-15 DIAGNOSIS — I480 Paroxysmal atrial fibrillation: Secondary | ICD-10-CM

## 2022-05-15 DIAGNOSIS — I739 Peripheral vascular disease, unspecified: Secondary | ICD-10-CM

## 2022-05-15 DIAGNOSIS — E785 Hyperlipidemia, unspecified: Secondary | ICD-10-CM | POA: Diagnosis not present

## 2022-05-15 MED ORDER — LISINOPRIL 20 MG PO TABS: 20.0000 mg | ORAL_TABLET | Freq: Every day | ORAL | 3 refills | Status: DC

## 2022-05-15 NOTE — Patient Instructions (Signed)
Medication Instructions:  INCREASE the Lisinopril to 20 mg once daily *If you need a refill on your cardiac medications before your next appointment, please call your pharmacy*   Lab Work: None ordered If you have labs (blood work) drawn today and your tests are completely normal, you will receive your results only by: MyChart Message (if you have MyChart) OR A paper copy in the mail If you have any lab test that is abnormal or we need to change your treatment, we will call you to review the results.   Testing/Procedures: None ordered   Follow-Up: At [facility], you and your health needs are our priority.  As part of our continuing mission to provide you with exceptional heart care, we have created designated Provider Care Teams.  These Care Teams include your primary Cardiologist (physician) and Advanced Practice Providers (APPs -  Physician Assistants and Nurse Practitioners) who all work together to provide you with the care you need, when you need it.  We recommend signing up for the patient portal called "MyChart".  Sign up information is provided on this After Visit Summary.  MyChart is used to connect with patients for Virtual Visits (Telemedicine).  Patients are able to view lab/test results, encounter notes, upcoming appointments, etc.  Non-urgent messages can be sent to your provider as well.   To learn more about what you can do with MyChart, go to [email or web address].    Your next appointment:   12 month(s)  Provider:   Dr. Kirke Corin

## 2022-05-15 NOTE — Progress Notes (Signed)
Cardiology Office Note   Date:  05/15/2022   ID:  ZYEN TRIGGS, DOB 03-Apr-1950, MRN [id number]  PCP:  Elizabeth Palau, FNP  Cardiologist:  Dr. Elease Hashimoto  No chief complaint on file.     History of Present Illness: Kevin Leon is a 72 y.o. male who is here today for follow-up visit regarding peripheral arterial disease.   He has known history of paroxysmal atrial fibrillation with prior subdural hematoma currently not on anticoagulation, essential hypertension, rheumatoid arthritis, hepatitis C, tobacco use, hyponatremia and latent TB infection. He is followed for right calf claudication due to a long occlusion of the right SFA with moderate left SFA disease. His symptoms were not lifestyle limiting and I recommended smoking cessation, risk factor modifications and a walking exercise program.  He reports stable bilateral calf claudication which happens mainly if he walks uphill.  He is limited by knee arthritis as well.  No chest pain or shortness of breath.  He has not been able to quit smoking and currently smokes half a pack per day.   Past Medical History:  Diagnosis Date   Anxiety    Aortic insufficiency    Echo 02/2019: EF 60-65, Gr 2 DD, normal wall motion, normal RV SF, mild LAE, trivial MR/TR, mild to moderate AI// Echocardiogram 3/22: EF 60-65, no RWMA, normal RVSF, mild AI, AV sclerosis w/o AS    Arthritis    Essential hypertension 10/04/2015   Hepatitis C    History of stress test    ETT-Echo in 2010 was normal   History of subdural hematoma 08/15/2016   HTN (hypertension)    PAD (peripheral artery disease) (HCC)    ABIs/LE arterial dopplers 4/22: R 0.63; L 1.0 / Long occlusion R SFA thru prox popliteal artery; R PTA occluded / L SFA 50-74    Persistent atrial fibrillation (HCC) 09/09/2015   CHADS2-VASc=2 //  Apixaban //  s/p DCCV 09/14/15 // Monitor 01/2019: NSR, single NSVT (4 beats); single episode of SVT (6 beats)   Right rotator cuff tear 10/08/2014   Tobacco  abuse 10/04/2015   Tuberculosis     Past Surgical History:  Procedure Laterality Date   CARDIOVERSION [name] 09/14/2015   Procedure: CARDIOVERSION;  Surgeon: Vesta Mixer, MD;  Location: [facility] ENDOSCOPY;  Service: Cardiovascular;  Laterality: [name];   large piece of wood removed from buttocks     as child   SHOULDER ARTHROSCOPY WITH ROTATOR CUFF REPAIR Right 10/08/2014   Procedure: RIGHT SHOULDER ARTHROSCOPY, DEBRIDEMENT, ACROMIOPLASTY WITH ROTATOR CUFF REPAIR;  Surgeon: Teryl Lucy, MD;  Location: [facility];  Service: Orthopedics;  Laterality: Right;   TONSILLECTOMY       Current Outpatient Medications  Medication Sig Dispense Refill   aspirin EC 81 MG tablet Take 1 tablet (81 mg total) by mouth daily. Swallow whole. 90 tablet 3   carvedilol (COREG) 6.25 MG tablet Take 1.5 by mouth two times daily 270 tablet 3   folic acid (FOLVITE) 1 MG tablet Take 1 mg by mouth daily.     lisinopril (ZESTRIL) 10 MG tablet TAKE 1 TABLET DAILY WITH THE 5 MG TAB TO EQUAL 15 MG ONCE DAILY. 90 tablet 3   LORazepam (ATIVAN) 0.5 MG tablet Take 0.5 mg by mouth every 6 (six) hours.     Methotrexate 10 MG/0.4ML SOSY Inject 0.5 mg into the skin once a week.     Polyethyl Glyc-Propyl Glyc PF (SYSTANE ULTRA PF) 0.4-0.3 % SOLN Place 1 drop into both  eyes daily as needed (for dryness/irritation).     rosuvastatin (CRESTOR) 20 MG tablet TAKE 1 TABLET BY MOUTH EVERY DAY 90 tablet 3   No current facility-administered medications for this visit.    Allergies:   Isoniazid and Priftin [rifapentine]    Social History:  The patient  reports that he has been smoking cigarettes. He has been smoking an average of .5 packs per day. He has never used smokeless tobacco. He reports that he does not currently use alcohol. He reports that he does not use drugs.   Family History:  The patient's family history includes Hypertension in his mother.    ROS:  Please see the history of present illness.   Otherwise,  review of systems are positive for none.   All other systems are reviewed and negative.    PHYSICAL EXAM: VS:  BP (!) 160/62   Pulse (!) 55   Ht 6' (1.829 m)   Wt 159 lb (72.1 kg)   SpO2 99%   BMI 21.56 kg/m  , BMI Body mass index is 21.56 kg/m. GEN: Well nourished, well developed, in no acute distress  HEENT: normal  Neck: no JVD, carotid bruits, or masses Cardiac: RRR; no murmurs, rubs, or gallops,no edema  Respiratory:  clear to auscultation bilaterally, normal work of breathing GI: soft, nontender, nondistended, + BS MS: no deformity or atrophy  Skin: warm and dry, no rash Neuro:  Strength and sensation are intact Psych: euthymic mood, full affect   EKG:  EKG is not ordered today.   Recent Labs: No results found for requested labs within last 365 days.    Lipid Panel    Component Value Date/Time   CHOL 122 07/27/2020 0950   TRIG 49 07/27/2020 0950   HDL 51 07/27/2020 0950   CHOLHDL 2.4 07/27/2020 0950   CHOLHDL 2 04/03/2013 0840   VLDL 3.8 04/03/2013 0840   LDLCALC 60 07/27/2020 0950      Wt Readings from Last 3 Encounters:  05/15/22 159 lb (72.1 kg)  05/24/21 159 lb 9.6 oz (72.4 kg)  05/19/21 157 lb 13.6 oz (71.6 kg)           No data to display            ASSESSMENT AND PLAN:  1.  Peripheral arterial disease: He has chronically occluded right SFA with collaterals.  He has stable claudication that does not seem to be lifestyle limiting.  Continue medical therapy.    2.  Paroxysmal atrial fibrillation: Not on anticoagulation due to prior subdural hematoma.  He has not had any evidence of atrial fibrillation in the last few years.  3.  Tobacco use: I discussed the importance of smoking cessation but he has not been able to quit in spite of multiple attempts.  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  He uses nicotine to calm his nerves.  4.  Hyperlipidemia: He is doing extremely well with rosuvastatin.  I reviewed  his most recent lipid profile done in December which showed an LDL of 48.  5.  Essential hypertension: His blood pressure is elevated.  I increased lisinopril to 20 mg daily.    Disposition:   FU with me in 12 months  Signed,  Lorine Bears, MD  05/15/2022 8:13 AM    [facility]
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
## 2022-05-21 ENCOUNTER — Encounter: Payer: Self-pay | Admitting: Physician Assistant

## 2022-05-21 ENCOUNTER — Ambulatory Visit: Payer: Medicare Other | Attending: Physician Assistant | Admitting: Physician Assistant

## 2022-05-21 VITALS — BP 128/70 | HR 63 | Ht 72.0 in | Wt 160.0 lb

## 2022-05-21 DIAGNOSIS — I480 Paroxysmal atrial fibrillation: Secondary | ICD-10-CM | POA: Diagnosis not present

## 2022-05-21 DIAGNOSIS — I10 Essential (primary) hypertension: Secondary | ICD-10-CM

## 2022-05-21 DIAGNOSIS — I739 Peripheral vascular disease, unspecified: Secondary | ICD-10-CM | POA: Diagnosis not present

## 2022-05-21 DIAGNOSIS — I2510 Atherosclerotic heart disease of native coronary artery without angina pectoris: Secondary | ICD-10-CM | POA: Insufficient documentation

## 2022-05-21 DIAGNOSIS — I351 Nonrheumatic aortic (valve) insufficiency: Secondary | ICD-10-CM

## 2022-05-21 DIAGNOSIS — E7800 Pure hypercholesterolemia, unspecified: Secondary | ICD-10-CM

## 2022-05-21 NOTE — Assessment & Plan Note (Signed)
Mild by echocardiogram in March 2022.  He does not have a significant murmur on exam.  Consider repeating echocardiogram in the next 1 to 2 years.

## 2022-05-21 NOTE — Assessment & Plan Note (Signed)
Labs from primary care personally reviewed.  His LDL is excellent at 48, and his HDL is 92.  Continue Crestor 20 mg daily.

## 2022-05-21 NOTE — Assessment & Plan Note (Signed)
Noted on prior CT. We discussed potentially arranging a CAC score to assess calcium burden.  He has not had any symptoms to suggest angina.  I recommended that if his calcium score is significantly elevated, we could proceed with stress testing.  At this time, I do not think he needs a coronary CTA.  He would like to hold off on the calcium score for now.  Continue ASA 81 mg daily, Crestor 20 mg daily.

## 2022-05-21 NOTE — Patient Instructions (Signed)
Medication Instructions:  Your physician recommends that you continue on your current medications as directed. Please refer to the Current Medication list given to you today.  *If you need a refill on your cardiac medications before your next appointment, please call your pharmacy*   Lab Work: None ordered  If you have labs (blood work) drawn today and your tests are completely normal, you will receive your results only by: MyChart Message (if you have MyChart) OR A paper copy in the mail If you have any lab test that is abnormal or we need to change your treatment, we will call you to review the results.   Testing/Procedures: None ordered   Follow-Up: At [facility], you and your health needs are our priority.  As part of our continuing mission to provide you with exceptional heart care, we have created designated Provider Care Teams.  These Care Teams include your primary Cardiologist (physician) and Advanced Practice Providers (APPs -  Physician Assistants and Nurse Practitioners) who all work together to provide you with the care you need, when you need it.  We recommend signing up for the patient portal called "MyChart".  Sign up information is provided on this After Visit Summary.  MyChart is used to connect with patients for Virtual Visits (Telemedicine).  Patients are able to view lab/test results, encounter notes, upcoming appointments, etc.  Non-urgent messages can be sent to your provider as well.   To learn more about what you can do with MyChart, go to [email or web address].    Your next appointment:   1 year(s)  Provider:   Kristeen Miss, MD  or Tereso Newcomer, PA-C         Other Instructions  Your physician has requested that you regularly monitor and record your blood pressure readings at home. Please use the same machine at the same time of day to check your readings and record them to bring to your follow-up visit.   Please monitor blood pressures and  keep a log of your readings for 2 weeks then send via mychart.    Make sure to check 2 hours after your medications.    AVOID these things for 30 minutes before checking your blood pressure: No Drinking caffeine. No Drinking alcohol. No Eating. No Smoking. No Exercising.   Five minutes before checking your blood pressure: Pee. Sit in a dining chair. Avoid sitting in a soft couch or armchair. Be quiet. Do not talk

## 2022-05-21 NOTE — Assessment & Plan Note (Signed)
Blood pressure is controlled today.  He notes fairly controlled blood pressure at home.  He could not tolerate the recent increase in his lisinopril.  I have asked him to monitor his blood pressure at home for the next couple weeks and send me those readings for review.  For now, continue carvedilol 9.375 mg twice daily, lisinopril 10 mg in the morning and 5 mg in the evening.

## 2022-05-21 NOTE — Assessment & Plan Note (Signed)
Maintaining sinus rhythm by exam.  He was taken off of anticoagulation in the past secondary to history of subdural bleed.

## 2022-05-21 NOTE — Assessment & Plan Note (Signed)
Stable right calf claudication.  Continue follow-up with Dr. Kirke Corin as planned.

## 2022-07-11 ENCOUNTER — Ambulatory Visit (HOSPITAL_COMMUNITY)
Admission: RE | Admit: 2022-07-11 | Discharge: 2022-07-11 | Disposition: A | Discharge status: Home / Self Care | Payer: Medicare Other | Source: Ambulatory Visit | Attending: Emergency Medicine | Admitting: Emergency Medicine

## 2022-07-11 ENCOUNTER — Encounter (HOSPITAL_COMMUNITY): Payer: Self-pay

## 2022-07-11 VITALS — BP 174/68 | HR 71 | Temp 98.0°F | Resp 18

## 2022-07-11 DIAGNOSIS — R21 Rash and other nonspecific skin eruption: Secondary | ICD-10-CM

## 2022-07-11 MED ORDER — PREDNISONE 10 MG (21) PO TBPK: ORAL_TABLET | Freq: Every day | ORAL | 0 refills | Status: DC

## 2022-07-11 MED ORDER — CEPHALEXIN 500 MG PO CAPS: 500.0000 mg | ORAL_CAPSULE | Freq: Two times a day (BID) | ORAL | 0 refills | Status: AC

## 2022-07-11 NOTE — ED Provider Notes (Signed)
[facility]    CSN: [id number] Arrival date & time: 07/11/22  5366      History   Chief Complaint Chief Complaint  Patient presents with   Rash    HPI Kevin Leon is a 72 y.o. male.   Patient presents for evaluation for rash present to the anterior waistline present for 2 months.  Rash is pruritic, nondraining or painful.  Has attempted use of antifungal and Benadryl cream over the affected area which has been ineffective.  Denies fevers.  Denies changes in toiletries, recent travel, medication changes or dietary changes.  No other close contacts have similar symptoms.  Past Medical History:  Diagnosis Date   Anxiety    Aortic insufficiency    Echo 02/2019: EF 60-65, Gr 2 DD, normal wall motion, normal RV SF, mild LAE, trivial MR/TR, mild to moderate AI// Echocardiogram 3/22: EF 60-65, no RWMA, normal RVSF, mild AI, AV sclerosis w/o AS    Arthritis    Essential hypertension 10/04/2015   Hepatitis C    History of stress test    ETT-Echo in 2010 was normal   History of subdural hematoma 08/15/2016   HTN (hypertension)    PAD (peripheral artery disease) (HCC)    ABIs/LE arterial dopplers 4/22: R 0.63; L 1.0 / Long occlusion R SFA thru prox popliteal artery; R PTA occluded / L SFA 50-74    Persistent atrial fibrillation (HCC) 09/09/2015   CHADS2-VASc=2 //  Apixaban //  s/p DCCV 09/14/15 // Monitor 01/2019: NSR, single NSVT (4 beats); single episode of SVT (6 beats)   Right rotator cuff tear 10/08/2014   Tobacco abuse 10/04/2015   Tuberculosis     Patient Active Problem List   Diagnosis Date Noted   Coronary artery calcification seen on CT scan 05/21/2022   Pure hypercholesterolemia 06/22/2020   PAD (peripheral artery disease) (HCC)    Acute respiratory failure (HCC) 08/14/2019   Loculated pleural effusion 08/14/2019   Rheumatoid arthritis (HCC) 08/14/2019   Acute respiratory failure with hypoxia (HCC) 08/14/2019   Pleural effusion    Aortic insufficiency     Acute superficial gastritis without hemorrhage 04/04/2017   Diverticulosis 03/21/2017   Alcohol consumption heavy 09/18/2016   History of subdural hematoma 08/15/2016   Essential hypertension 10/04/2015   Tobacco abuse 10/04/2015   PAF (paroxysmal atrial fibrillation) (HCC) 09/09/2015   Right rotator cuff tear 10/08/2014   Quit smoking within past year 04/02/2014   GAD (generalized anxiety disorder) 03/31/2013   Hepatitis C 03/31/2013   Tinea cruris 03/31/2013    Past Surgical History:  Procedure Laterality Date   CARDIOVERSION [name] 09/14/2015   Procedure: CARDIOVERSION;  Surgeon: Vesta Mixer, MD;  Location: [facility] ENDOSCOPY;  Service: Cardiovascular;  Laterality: [name];   large piece of wood removed from buttocks     as child   SHOULDER ARTHROSCOPY WITH ROTATOR CUFF REPAIR Right 10/08/2014   Procedure: RIGHT SHOULDER ARTHROSCOPY, DEBRIDEMENT, ACROMIOPLASTY WITH ROTATOR CUFF REPAIR;  Surgeon: Teryl Lucy, MD;  Location: [facility];  Service: Orthopedics;  Laterality: Right;   TONSILLECTOMY         Home Medications    Prior to Admission medications   Medication Sig Start Date End Date Taking? Authorizing Provider  cephALEXin (KEFLEX) 500 MG capsule Take 1 capsule (500 mg total) by mouth 2 (two) times daily for 5 days. 07/11/22 07/16/22 Yes Audre Cenci R, NP  predniSONE (STERAPRED UNI-PAK 21 TAB) 10 MG (21) TBPK tablet Take by mouth  daily. Take 6 tabs by mouth daily  for 1 days, then 5 tabs for 1 days, then 4 tabs for 1 days, then 3 tabs for 1 days, 2 tabs for 1 days, then 1 tab by mouth daily for 1 days 07/11/22  Yes Salli Quarry R, NP  aspirin EC 81 MG tablet Take 1 tablet (81 mg total) by mouth daily. Swallow whole. 04/19/20   Tereso Newcomer T, PA-C  carvedilol (COREG) 6.25 MG tablet Take 1.5 by mouth two times daily 08/07/21   Nahser, Deloris Ping, MD  folic acid (FOLVITE) 1 MG tablet Take 1 mg by mouth daily. 03/02/20   [provider]  lisinopril (ZESTRIL) 20  MG tablet Take 1 tablet (20 mg total) by mouth daily. 05/15/22   Iran Ouch, MD  LORazepam (ATIVAN) 0.5 MG tablet Take 0.5 mg by mouth every 6 (six) hours. 04/21/11   [provider]  Methotrexate 10 MG/0.4ML SOSY Inject 0.5 mg into the skin once a week.    [provider]  Polyethyl Glyc-Propyl Glyc PF (SYSTANE ULTRA PF) 0.4-0.3 % SOLN Place 1 drop into both eyes daily as needed (for dryness/irritation).    [provider]  rosuvastatin (CRESTOR) 20 MG tablet TAKE 1 TABLET BY MOUTH EVERY DAY 07/07/21   Nahser, Deloris Ping, MD    Family History Family History  Problem Relation Age of Onset   Hypertension Mother    Heart attack Neg Hx     Social History Social History   Tobacco Use   Smoking status: Every Day    Packs/day: .5    Types: Cigarettes    Last attempt to quit: 02/15/2014    Years since quitting: 8.4   Smokeless tobacco: Never   Tobacco comments:    smokes 10 per day, trying to quit.  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      Excl. in GC?    No data found.  Updated Vital Signs BP (!) 174/68 (BP Location: Left Arm)   Pulse 71   Temp 98 F (36.7 C) (Oral)   Resp 18   SpO2 96%   Visual Acuity Right Eye Distance:   Left Eye Distance:   Bilateral Distance:    Right Eye Near:   Left Eye Near:    Bilateral Near:     Physical Exam Constitutional:       Appearance: Normal appearance.  Eyes:     Extraocular Movements: Extraocular movements intact.  Pulmonary:     Effort: Pulmonary effort is normal.  Skin:    Comments: Defer to photo   Neurological:     Mental Status: He is alert and oriented to person, place, and time. Mental status is at baseline.      UC Treatments / Results  Labs (all labs ordered are listed, but only abnormal results are displayed) Labs Reviewed - No data to display  EKG   Radiology No results found.  Procedures Procedures (including critical care time)  Medications Ordered in UC Medications - No data to display  Initial Impression / Assessment and Plan / UC Course  I have reviewed the triage vital signs and the nursing notes.  Pertinent labs & imaging results that were available during my care of the patient were reviewed by me and considered in my medical decision making (see chart for details).  Rash  Unknown etiology, coverage for inflammation and infection as there is scant yellow crusting within the rash, prescribed cephalexin and prednisone taper, discussed administration, may continue use of topical antihistamines for management of pruritus, advised against long exposure to heat, advised follow-up if symptoms persist past use of medication Final Clinical Impressions(s) / UC Diagnoses   Final diagnoses:  Rash     Discharge Instructions      Today you are being treated for your rash, due to your rash being present for 2 months will provide coverage for inflammation as well as infection  Begin prednisone as directed to provide coverage for inflammation, take with food  Begin cephalexin every morning and every evening for 5 days to provide coverage for infection  You may continue use of topical calamine or Benadryl cream to help manage itching, you may also continue oral Benadryl  Please avoid long exposures to heat such as a hot steamy shower or being outside as this may cause  further irritation to your rash  You may follow-up with his urgent care as needed if symptoms persist or worsen     ED Prescriptions     Medication Sig Dispense Auth. Provider   predniSONE (STERAPRED UNI-PAK 21 TAB) 10 MG (21) TBPK tablet Take by mouth daily. Take 6 tabs by mouth daily  for 1 days, then 5 tabs for 1 days, then 4 tabs for 1 days, then 3 tabs for 1 days, 2 tabs for 1 days, then 1 tab by mouth daily for 1 days 21 tablet Atia Haupt R, NP   cephALEXin (KEFLEX) 500 MG capsule Take 1 capsule (500 mg total) by mouth 2 (two) times daily for 5 days. 10 capsule Valinda Hoar, NP      PDMP not reviewed this encounter.   Valinda Hoar, NP 07/11/22 1020

## 2022-07-11 NOTE — ED Triage Notes (Signed)
Pt c/o a red itchy rash at his belt line for 2 months. Using benadryl cream with relief. Used antifungi cream with no relief.

## 2022-07-11 NOTE — Discharge Instructions (Addendum)
Today you are being treated for your rash, due to your rash being present for 2 months will provide coverage for inflammation as well as infection  Begin prednisone as directed to provide coverage for inflammation, take with food  Begin cephalexin every morning and every evening for 5 days to provide coverage for infection  You may continue use of topical calamine or Benadryl cream to help manage itching, you may also continue oral Benadryl  Please avoid long exposures to heat such as a hot steamy shower or being outside as this may cause further irritation to your rash  You may follow-up with his urgent care as needed if symptoms persist or worsen

## 2022-07-16 MED ORDER — LISINOPRIL 10 MG PO TABS: 10.0000 mg | ORAL_TABLET | Freq: Every day | ORAL | 3 refills | Status: DC

## 2022-07-16 MED ORDER — LISINOPRIL 5 MG PO TABS: 5.0000 mg | ORAL_TABLET | Freq: Every day | ORAL | 3 refills | Status: DC

## 2022-07-16 MED ORDER — ROSUVASTATIN CALCIUM 20 MG PO TABS: 20.0000 mg | ORAL_TABLET | Freq: Every day | ORAL | 3 refills | Status: DC

## 2022-07-16 NOTE — Addendum Note (Signed)
Addended by: Burnetta Sabin on: 07/16/2022 09:07 AM   Modules accepted: Orders

## 2022-07-16 NOTE — Addendum Note (Signed)
Addended by: Burnetta Sabin on: 07/16/2022 09:01 AM   Modules accepted: Orders

## 2022-08-06 ENCOUNTER — Other Ambulatory Visit: Payer: Self-pay

## 2022-08-06 MED ORDER — CARVEDILOL 6.25 MG PO TABS: ORAL_TABLET | ORAL | 3 refills | Status: DC

## 2023-04-16 ENCOUNTER — Other Ambulatory Visit: Payer: Self-pay | Admitting: Physician Assistant

## 2023-05-21 ENCOUNTER — Encounter: Payer: Self-pay | Admitting: Cardiovascular Disease

## 2023-05-21 ENCOUNTER — Ambulatory Visit: Attending: Cardiovascular Disease | Admitting: Cardiovascular Disease

## 2023-05-21 VITALS — BP 191/70 | HR 59 | Ht 72.0 in | Wt 152.6 lb

## 2023-05-21 DIAGNOSIS — E785 Hyperlipidemia, unspecified: Secondary | ICD-10-CM

## 2023-05-21 DIAGNOSIS — Z720 Tobacco use: Secondary | ICD-10-CM | POA: Diagnosis not present

## 2023-05-21 DIAGNOSIS — I10 Essential (primary) hypertension: Secondary | ICD-10-CM | POA: Diagnosis not present

## 2023-05-21 DIAGNOSIS — I739 Peripheral vascular disease, unspecified: Secondary | ICD-10-CM | POA: Diagnosis not present

## 2023-05-21 NOTE — Progress Notes (Signed)
 Cardiology Office Note   Date:  05/21/2023   ID:  Kevin Leon, DOB [birth year], MRN [id number]  PCP:  Jearlean Mince, PA-C  Cardiologist:  Dr. Alroy Aspen  No chief complaint on file.     History of Present Illness: Kevin Leon is a 73 y.o. male who is here today for follow-up visit regarding peripheral arterial disease.   He has known history of paroxysmal atrial fibrillation with prior subdural hematoma currently not on anticoagulation, essential hypertension, rheumatoid arthritis, hepatitis C, tobacco use, hyponatremia and latent TB infection. He is followed for right calf claudication due to a long occlusion of the right SFA with moderate left SFA disease. He reports stable right calf claudication which happens after overexertion and does not seem to be lifestyle limiting.  He continues to smoke half a pack per day. I did increase his lisinopril  to 20 mg once daily last year but it seems that he did not tolerate the higher dose due to low blood pressure.  His blood pressure is elevated today but he reports relatively normal readings at home and he believes he has whitecoat syndrome.  He denies chest pain or palpitations.   Past Medical History:  Diagnosis Date   Anxiety    Aortic insufficiency    Echo 02/2019: EF 60-65, Gr 2 DD, normal wall motion, normal RV SF, mild LAE, trivial MR/TR, mild to moderate AI// Echocardiogram 3/22: EF 60-65, no RWMA, normal RVSF, mild AI, AV sclerosis w/o AS    Arthritis    Essential hypertension 10/04/2015   Hepatitis C    History of stress test    ETT-Echo in 2010 was normal   History of subdural hematoma 08/15/2016   HTN (hypertension)    PAD (peripheral artery disease) (HCC)    ABIs/LE arterial dopplers 4/22: R 0.63; L 1.0 / Long occlusion R SFA thru prox popliteal artery; R PTA occluded / L SFA 50-74    Persistent atrial fibrillation (HCC) 09/09/2015   CHADS2-VASc=2 //  Apixaban  //  s/p DCCV 09/14/15 // Monitor 01/2019: NSR, single  NSVT (4 beats); single episode of SVT (6 beats)   Right rotator cuff tear 10/08/2014   Tobacco abuse 10/04/2015   Tuberculosis     Past Surgical History:  Procedure Laterality Date   CARDIOVERSION [name] 09/14/2015   Procedure: CARDIOVERSION;  Surgeon: Lake Pilgrim, MD;  Location: [facility] ENDOSCOPY;  Service: Cardiovascular;  Laterality: [name];   large piece of wood removed from buttocks     as child   SHOULDER ARTHROSCOPY WITH ROTATOR CUFF REPAIR Right 10/08/2014   Procedure: RIGHT SHOULDER ARTHROSCOPY, DEBRIDEMENT, ACROMIOPLASTY WITH ROTATOR CUFF REPAIR;  Surgeon: Osa Blase, MD;  Location: [facility];  Service: Orthopedics;  Laterality: Right;   TONSILLECTOMY       Current Outpatient Medications  Medication Sig Dispense Refill   aspirin  EC 81 MG tablet Take 1 tablet (81 mg total) by mouth daily. Swallow whole. 90 tablet 3   carvedilol  (COREG ) 6.25 MG tablet Take 1.5 by mouth two times daily 270 tablet 3   folic acid (FOLVITE) 1 MG tablet Take 1 mg by mouth daily.     lisinopril  (ZESTRIL ) 10 MG tablet Take 1 tablet (10 mg total) by mouth daily. TAKE ALONG WITH A 5 MG TABLET DAILY 90 tablet 3   lisinopril  (ZESTRIL ) 5 MG tablet Take 1 tablet (5 mg total) by mouth daily. 90 tablet 3   LORazepam  (ATIVAN ) 0.5 MG tablet Take 0.5 mg by mouth  every 6 (six) hours.     Methotrexate 10 MG/0.4ML SOSY Inject 0.5 mg into the skin once a week.     Polyethyl Glyc-Propyl Glyc PF (SYSTANE ULTRA PF) 0.4-0.3 % SOLN Place 1 drop into both eyes daily as needed (for dryness/irritation).     predniSONE  (STERAPRED UNI-PAK 21 TAB) 10 MG (21) TBPK tablet Take by mouth daily. Take 6 tabs by mouth daily  for 1 days, then 5 tabs for 1 days, then 4 tabs for 1 days, then 3 tabs for 1 days, 2 tabs for 1 days, then 1 tab by mouth daily for 1 days 21 tablet 0   rosuvastatin  (CRESTOR ) 20 MG tablet Take 1 tablet (20 mg total) by mouth daily. Please keep upcoming appointment on 5/6 with Cardiology in order to  receive future refills. Thank You. 90 tablet 0   No current facility-administered medications for this visit.    Allergies:   Isoniazid and Priftin [rifapentine]    Social History:  The patient  reports that he has been smoking cigarettes. He has never used smokeless tobacco. He reports that he does not currently use alcohol. He reports that he does not use drugs.   Family History:  The patient's family history includes Hypertension in his mother.    ROS:  Please see the history of present illness.   Otherwise, review of systems are positive for none.   All other systems are reviewed and negative.    PHYSICAL EXAM: VS:  BP (!) 191/70 (BP Location: Left Arm, Patient Position: Sitting)   Pulse (!) 59   Ht 6' (1.829 m)   Wt 152 lb 9.6 oz (69.2 kg)   SpO2 98%   BMI 20.70 kg/m  , BMI Body mass index is 20.7 kg/m. GEN: Well nourished, well developed, in no acute distress  HEENT: normal  Neck: no JVD, carotid bruits, or masses Cardiac: RRR; no murmurs, rubs, or gallops,no edema  Respiratory:  clear to auscultation bilaterally, normal work of breathing GI: soft, nontender, nondistended, + BS MS: no deformity or atrophy  Skin: warm and dry, no rash Neuro:  Strength and sensation are intact Psych: euthymic mood, full affect   EKG:  EKG is  ordered today. EKG showed:  Sinus bradycardia When compared with ECG of 24-Oct-2021 08:56, No significant change was found    Recent Labs: No results found for requested labs within last 365 days.    Lipid Panel    Component Value Date/Time   CHOL 122 07/27/2020 0950   TRIG 49 07/27/2020 0950   HDL 51 07/27/2020 0950   CHOLHDL 2.4 07/27/2020 0950   CHOLHDL 2 04/03/2013 0840   VLDL 3.8 04/03/2013 0840   LDLCALC 60 07/27/2020 0950      Wt Readings from Last 3 Encounters:  05/21/23 152 lb 9.6 oz (69.2 kg)  05/21/22 160 lb (72.6 kg)  05/15/22 159 lb (72.1 kg)           No data to display            ASSESSMENT AND  PLAN:  1.  Peripheral arterial disease: He has chronically occluded right SFA with collaterals.  He has stable claudication that does not seem to be lifestyle limiting.  Continue medical therapy.  I discussed with him the importance of at least 30 minutes of daily exercise.  2.  Paroxysmal atrial fibrillation: Not on anticoagulation due to prior subdural hematoma.  He has not had any evidence of atrial fibrillation in the last few  years.  If he develops recurrent atrial fibrillation, challenging with anticoagulation should be considered.  3.  Tobacco use: I discussed the importance of smoking cessation but he has not been able to quit in spite of multiple attempts.  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  He uses nicotine  to calm his nerves.  4.  Hyperlipidemia: He is doing extremely well with rosuvastatin .  I reviewed most recent lipid profile done in March which showed an LDL of 34 which is at target.  5.  Essential hypertension: Possible component of whitecoat syndrome.  He did not tolerate increased dose of lisinopril  last year.  Continue current medications for now and will consider increasing lisinopril  if blood pressure continues to be elevated.    Disposition:   FU with me in 12 months  Signed,  Antionette Kirks, MD  05/21/2023 10:07 AM    [facility]

## 2023-05-21 NOTE — Patient Instructions (Signed)
 Medication Instructions:  Your physician recommends that you continue on your current medications as directed. Please refer to the Current Medication list given to you today.  *If you need a refill on your cardiac medications before your next appointment, please call your pharmacy*  Lab Work: NONE ordered at this time of appointment    Testing/Procedures: NONE ordered at this time of appointment    Follow-Up: At [facility], you and your health needs are our priority.  As part of our continuing mission to provide you with exceptional heart care, our providers are all part of one team.  This team includes your primary Cardiologist (physician) and Advanced Practice Providers or APPs (Physician Assistants and Nurse Practitioners) who all work together to provide you with the care you need, when you need it.  Your next appointment:   1 year(s)  Provider:   Dr. Alvenia Aus    We recommend signing up for the patient portal called "MyChart".  Sign up information is provided on this After Visit Summary.  MyChart is used to connect with patients for Virtual Visits (Telemedicine).  Patients are able to view lab/test results, encounter notes, upcoming appointments, etc.  Non-urgent messages can be sent to your provider as well.   To learn more about what you can do with MyChart, go to [email or web address].

## 2023-06-30 NOTE — Progress Notes (Unsigned)
 OFFICE NOTE:    Date:  07/01/2023  ID:  [name], DOB 10-06-1950, MRN [id number] PCP: Jearlean Mince, PA-C  [facility] Providers Cardiologist:  Ahmad Alert, MD Cardiology APP:  Sherwood Donath  PV Cardiologist:  Antionette Kirks, MD       Patient Profile:   Paroxysmal atrial fibrillation Prior subdural hematoma >> no longer on anticoagulation Aortic insufficiency  TTE 03/22/20: EF 60-65, no RWMA, normal RVSF, mild AI, AV sclerosis w/o AS  Peripheral arterial disease  Bilateral SFA disease - med Rx  Coronary artery calcification Hypertension Hepatitis C Tobacco abuse Hyponatremia Anxiety   Rheumatoid Arthritis Latent TB Infection  Palpitations Monitor 02/2019: NSR, 4 bt NSVT, 6 bt SVT R pleural effusion (loculated) 07/2019 2/2 chest trauma (horse kick) S/p chest tube         Discussed the use of AI scribe software for clinical note transcription with the patient, who gave verbal consent to proceed. History of Present Illness Kevin Leon is a 73 y.o. male who returns for follow up of HTN, AF, AI, CAC. He was last seen in 05/2022.   He is here alone. He has not had palpitations. He reports no significant symptoms of claudication. He feels tired when walking, attributing it to rheumatoid arthritis fatigue, but denies worsening of this symptom. 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 He smokes and wants to quit smoking, considering using nicotine  patches again. He previously quit using a three-stage nicotine  patch process but relapsed after two months. He recently returned from a trip to [place] , where he engaged in activities such as fishing and visiting family. No significant changes in his health during this trip. No chest pain, heavy or tight pressure in the chest, significant shortness of breath, recent falls, passing out, or heart racing episodes.   ROS-See HPI    Studies Reviewed:      Labs  reviewed - CareEverywhere 07/23/22: TSH 1.08, Hgb 14.6  04/15/23: TC 130, Tg 105, HDL 77, LDL 34, SCr 1.18, Na 132, K 5.2, ALT 20  Risk Assessment/Calculations:          Physical Exam:  VS:  BP 134/60   Pulse 70   Ht 6' (1.829 m)   Wt 150 lb 3.2 oz (68.1 kg)   SpO2 97%   BMI 20.37 kg/m    Wt Readings from Last 3 Encounters:  07/01/23 150 lb 3.2 oz (68.1 kg)  05/21/23 152 lb 9.6 oz (69.2 kg)  05/21/22 160 lb (72.6 kg)    Constitutional:      Appearance: Healthy appearance. Not in distress.  Neck:     Vascular: JVD normal.  Pulmonary:     Breath sounds: Normal breath sounds. No wheezing. No rales.  Cardiovascular:     Normal rate. Regular rhythm.     Murmurs: There is no murmur.  Edema:    Peripheral edema absent.  Abdominal:     Palpations: Abdomen is soft.        Assessment and Plan:    Assessment & Plan Coronary artery calcification seen on CT scan CAC noted on prior CT scan.  He is not having chest discomfort to suggest angina. Discussed potential for stress testing if he develops symptoms. - Continue aspirin  81 mg daily, rosuvastatin  20 mg daily. - Follow-up 6 months Essential hypertension Blood pressure is well-controlled. Recent home readings are good. - Continue Lisinopril  15 mg daily. - Continue Carvedilol  9.375  mg twice daily. PAD (peripheral artery disease) (HCC) Bilateral SFA disease with no significant claudication symptoms.  - Continue follow-up with Dr. Alvenia Aus Pure hypercholesterolemia LDL is optimal.  - Continue Crestor  (Rosuvastatin ) 20 mg daily. PAF (paroxysmal atrial fibrillation) (HCC) Maintaining sinus rhythm. Anticoagulation was discontinued years ago due to subdural hematoma. Nonrheumatic aortic valve insufficiency Mild aortic insufficiency noted on echocardiogram in March 2022.  He does not have a murmur on exam or symptoms to suggest significant aortic insufficiency. Tobacco use He is motivated to quit smoking and plans to attempt quitting  with nicotine  patches.     Dispo:  Return in about 6 months (around 12/31/2023) for Routine Follow Up, w/ Marlyse Single, PA-C.  Signed, Marlyse Single, PA-C

## 2023-07-01 ENCOUNTER — Ambulatory Visit: Attending: Physician Assistant | Admitting: Physician Assistant

## 2023-07-01 ENCOUNTER — Encounter: Payer: Self-pay | Admitting: Physician Assistant

## 2023-07-01 VITALS — BP 134/60 | HR 70 | Ht 72.0 in | Wt 150.2 lb

## 2023-07-01 DIAGNOSIS — I739 Peripheral vascular disease, unspecified: Secondary | ICD-10-CM | POA: Diagnosis not present

## 2023-07-01 DIAGNOSIS — I2510 Atherosclerotic heart disease of native coronary artery without angina pectoris: Secondary | ICD-10-CM | POA: Diagnosis not present

## 2023-07-01 DIAGNOSIS — I10 Essential (primary) hypertension: Secondary | ICD-10-CM | POA: Diagnosis not present

## 2023-07-01 DIAGNOSIS — I480 Paroxysmal atrial fibrillation: Secondary | ICD-10-CM

## 2023-07-01 DIAGNOSIS — E7800 Pure hypercholesterolemia, unspecified: Secondary | ICD-10-CM | POA: Diagnosis not present

## 2023-07-01 DIAGNOSIS — Z720 Tobacco use: Secondary | ICD-10-CM

## 2023-07-01 DIAGNOSIS — I351 Nonrheumatic aortic (valve) insufficiency: Secondary | ICD-10-CM

## 2023-07-01 MED ORDER — LISINOPRIL 10 MG PO TABS: 10.0000 mg | ORAL_TABLET | Freq: Every day | ORAL | 3 refills | Status: AC

## 2023-07-01 MED ORDER — ROSUVASTATIN CALCIUM 20 MG PO TABS: 20.0000 mg | ORAL_TABLET | Freq: Every day | ORAL | 3 refills | Status: AC

## 2023-07-01 MED ORDER — LISINOPRIL 5 MG PO TABS: 5.0000 mg | ORAL_TABLET | Freq: Every day | ORAL | 3 refills | Status: AC

## 2023-07-01 NOTE — Assessment & Plan Note (Signed)
 Bilateral SFA disease with no significant claudication symptoms.  - Continue follow-up with Dr. Alvenia Aus

## 2023-07-01 NOTE — Patient Instructions (Signed)
 Medication Instructions:  Your physician recommends that you continue on your current medications as directed. Please refer to the Current Medication list given to you today.  *If you need a refill on your cardiac medications before your next appointment, please call your pharmacy*  Lab Work: None ordered.  If you have labs (blood work) drawn today and your tests are completely normal, you will receive your results only by: MyChart Message (if you have MyChart) OR A paper copy in the mail If you have any lab test that is abnormal or we need to change your treatment, we will call you to review the results.  Testing/Procedures: None ordered.   Follow-Up: At [facility], you and your health needs are our priority.  As part of our continuing mission to provide you with exceptional heart care, our providers are all part of one team.  This team includes your primary Cardiologist (physician) and Advanced Practice Providers or APPs (Physician Assistants and Nurse Practitioners) who all work together to provide you with the care you need, when you need it.  Your next appointment:   6 months with Marlyse Single, PA-C

## 2023-07-01 NOTE — Assessment & Plan Note (Signed)
 Mild aortic insufficiency noted on echocardiogram in March 2022.  He does not have a murmur on exam or symptoms to suggest significant aortic insufficiency.

## 2023-07-01 NOTE — Assessment & Plan Note (Signed)
 Maintaining sinus rhythm. Anticoagulation was discontinued years ago due to subdural hematoma.

## 2023-07-01 NOTE — Assessment & Plan Note (Signed)
 CAC noted on prior CT scan.  He is not having chest discomfort to suggest angina. Discussed potential for stress testing if he develops symptoms. - Continue aspirin  81 mg daily, rosuvastatin  20 mg daily. - Follow-up 6 months

## 2023-07-01 NOTE — Assessment & Plan Note (Signed)
 Blood pressure is well-controlled. Recent home readings are good. - Continue Lisinopril  15 mg daily. - Continue Carvedilol  9.375 mg twice daily.

## 2023-07-01 NOTE — Assessment & Plan Note (Signed)
 LDL is optimal.  - Continue Crestor  (Rosuvastatin ) 20 mg daily.

## 2023-07-17 ENCOUNTER — Other Ambulatory Visit: Payer: Self-pay | Admitting: Cardiovascular Disease

## 2023-08-05 ENCOUNTER — Telehealth: Payer: Self-pay | Admitting: Physician Assistant

## 2023-08-05 NOTE — Telephone Encounter (Signed)
*  STAT* If patient is at the pharmacy, call can be transferred to refill team.   1. Which medications need to be refilled? (please list name of each medication and dose if known) carvedilol  (COREG ) 6.25 MG tablet    2. Would you like to learn more about the convenience, safety, & potential cost savings by using the [facility]? NO    3. Are you open to using the [facility] NO   4. Which pharmacy/location (including street and city if local pharmacy) is medication to be sent to? [id number] - [place], [place] - [address]    5. Do they need a 30 day or 90 day supply? 90

## 2023-08-05 NOTE — Telephone Encounter (Signed)
 RX sent to requested Pharmacy on 07/17/23.
